# Patient Record
Sex: Female | Born: 1945 | Race: White | Hispanic: No | Smoking: Former smoker
Health system: Southern US, Community
[De-identification: ages and names within clinical notes are randomized; demographics above are authoritative.]

## PROBLEM LIST (undated history)

## (undated) DIAGNOSIS — R112 Nausea with vomiting, unspecified: Secondary | ICD-10-CM

## (undated) DIAGNOSIS — E039 Hypothyroidism, unspecified: Secondary | ICD-10-CM

## (undated) DIAGNOSIS — M1990 Unspecified osteoarthritis, unspecified site: Secondary | ICD-10-CM

## (undated) DIAGNOSIS — I480 Paroxysmal atrial fibrillation: Secondary | ICD-10-CM

## (undated) DIAGNOSIS — Z9889 Other specified postprocedural states: Secondary | ICD-10-CM

## (undated) DIAGNOSIS — I779 Disorder of arteries and arterioles, unspecified: Secondary | ICD-10-CM

## (undated) DIAGNOSIS — I82409 Acute embolism and thrombosis of unspecified deep veins of unspecified lower extremity: Secondary | ICD-10-CM

## (undated) DIAGNOSIS — Z87442 Personal history of urinary calculi: Secondary | ICD-10-CM

## (undated) DIAGNOSIS — E119 Type 2 diabetes mellitus without complications: Secondary | ICD-10-CM

## (undated) DIAGNOSIS — I6529 Occlusion and stenosis of unspecified carotid artery: Secondary | ICD-10-CM

## (undated) DIAGNOSIS — E785 Hyperlipidemia, unspecified: Secondary | ICD-10-CM

## (undated) DIAGNOSIS — G629 Polyneuropathy, unspecified: Secondary | ICD-10-CM

## (undated) DIAGNOSIS — M545 Low back pain, unspecified: Secondary | ICD-10-CM

## (undated) DIAGNOSIS — I10 Essential (primary) hypertension: Secondary | ICD-10-CM

## (undated) DIAGNOSIS — G8929 Other chronic pain: Secondary | ICD-10-CM

## (undated) DIAGNOSIS — A4902 Methicillin resistant Staphylococcus aureus infection, unspecified site: Secondary | ICD-10-CM

## (undated) DIAGNOSIS — R079 Chest pain, unspecified: Secondary | ICD-10-CM

## (undated) DIAGNOSIS — I4891 Unspecified atrial fibrillation: Secondary | ICD-10-CM

## (undated) DIAGNOSIS — K591 Functional diarrhea: Secondary | ICD-10-CM

## (undated) DIAGNOSIS — F32A Depression, unspecified: Secondary | ICD-10-CM

## (undated) DIAGNOSIS — K219 Gastro-esophageal reflux disease without esophagitis: Secondary | ICD-10-CM

## (undated) DIAGNOSIS — F329 Major depressive disorder, single episode, unspecified: Secondary | ICD-10-CM

## (undated) HISTORY — DX: Other specified postprocedural states: Z98.890

## (undated) HISTORY — DX: Paroxysmal atrial fibrillation: I48.0

## (undated) HISTORY — DX: Gastro-esophageal reflux disease without esophagitis: K21.9

## (undated) HISTORY — DX: Essential (primary) hypertension: I10

## (undated) HISTORY — DX: Acute embolism and thrombosis of unspecified deep veins of unspecified lower extremity: I82.409

## (undated) HISTORY — DX: Disorder of arteries and arterioles, unspecified: I77.9

## (undated) HISTORY — DX: Other chronic pain: G89.29

## (undated) HISTORY — PX: VAGINAL HYSTERECTOMY: SUR661

## (undated) HISTORY — DX: Hyperlipidemia, unspecified: E78.5

## (undated) HISTORY — DX: Chest pain, unspecified: R07.9

## (undated) HISTORY — PX: LAPAROSCOPIC CHOLECYSTECTOMY: SUR755

## (undated) HISTORY — DX: Low back pain, unspecified: M54.50

## (undated) HISTORY — DX: Type 2 diabetes mellitus without complications: E11.9

## (undated) HISTORY — DX: Occlusion and stenosis of unspecified carotid artery: I65.29

## (undated) HISTORY — DX: Unspecified osteoarthritis, unspecified site: M19.90

## (undated) HISTORY — DX: Low back pain: M54.5

## (undated) HISTORY — DX: Methicillin resistant Staphylococcus aureus infection, unspecified site: A49.02

---
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
## 1998-10-11 ENCOUNTER — Ambulatory Visit (HOSPITAL_COMMUNITY): Admission: RE | Admit: 1998-10-11 | Discharge: 1998-10-11 | Payer: Self-pay | Admitting: Neurosurgery

## 1998-12-27 HISTORY — PX: LAMINECTOMY: SHX219

## 1999-01-12 ENCOUNTER — Encounter: Payer: Self-pay | Admitting: Neurosurgery

## 1999-01-14 ENCOUNTER — Inpatient Hospital Stay (HOSPITAL_COMMUNITY): Admission: RE | Admit: 1999-01-14 | Discharge: 1999-01-18 | Payer: Self-pay | Admitting: Neurosurgery

## 1999-01-14 ENCOUNTER — Encounter: Payer: Self-pay | Admitting: Neurosurgery

## 1999-07-22 ENCOUNTER — Ambulatory Visit (HOSPITAL_COMMUNITY): Admission: RE | Admit: 1999-07-22 | Discharge: 1999-07-22 | Payer: Self-pay | Admitting: Neurosurgery

## 1999-10-06 ENCOUNTER — Ambulatory Visit (HOSPITAL_COMMUNITY): Admission: RE | Admit: 1999-10-06 | Discharge: 1999-10-06 | Payer: Self-pay | Admitting: Neurosurgery

## 1999-10-06 ENCOUNTER — Encounter: Payer: Self-pay | Admitting: Neurosurgery

## 2000-03-19 ENCOUNTER — Ambulatory Visit (HOSPITAL_COMMUNITY): Admission: RE | Admit: 2000-03-19 | Discharge: 2000-03-19 | Payer: Self-pay | Admitting: Neurosurgery

## 2000-03-19 ENCOUNTER — Encounter: Payer: Self-pay | Admitting: Neurosurgery

## 2000-11-21 ENCOUNTER — Encounter: Admission: RE | Admit: 2000-11-21 | Discharge: 2000-11-21 | Payer: Self-pay | Admitting: Neurosurgery

## 2000-11-21 ENCOUNTER — Encounter: Payer: Self-pay | Admitting: Neurosurgery

## 2001-04-07 ENCOUNTER — Encounter: Payer: Self-pay | Admitting: Neurosurgery

## 2001-04-07 ENCOUNTER — Ambulatory Visit (HOSPITAL_COMMUNITY): Admission: RE | Admit: 2001-04-07 | Discharge: 2001-04-07 | Payer: Self-pay | Admitting: Neurosurgery

## 2001-05-02 ENCOUNTER — Encounter: Admission: RE | Admit: 2001-05-02 | Discharge: 2001-05-02 | Payer: Self-pay | Admitting: Neurosurgery

## 2001-05-02 ENCOUNTER — Encounter: Payer: Self-pay | Admitting: Neurosurgery

## 2001-05-17 ENCOUNTER — Encounter: Admission: RE | Admit: 2001-05-17 | Discharge: 2001-05-17 | Payer: Self-pay | Admitting: Neurosurgery

## 2001-05-17 ENCOUNTER — Encounter: Payer: Self-pay | Admitting: Neurosurgery

## 2001-05-31 ENCOUNTER — Encounter: Payer: Self-pay | Admitting: Neurosurgery

## 2001-05-31 ENCOUNTER — Encounter: Admission: RE | Admit: 2001-05-31 | Discharge: 2001-05-31 | Payer: Self-pay | Admitting: Neurosurgery

## 2001-08-23 ENCOUNTER — Ambulatory Visit (HOSPITAL_COMMUNITY): Admission: RE | Admit: 2001-08-23 | Discharge: 2001-08-23 | Payer: Self-pay | Admitting: Neurosurgery

## 2001-08-23 ENCOUNTER — Encounter: Payer: Self-pay | Admitting: Neurosurgery

## 2001-08-24 ENCOUNTER — Encounter: Payer: Self-pay | Admitting: Neurosurgery

## 2001-08-24 ENCOUNTER — Ambulatory Visit (HOSPITAL_COMMUNITY): Admission: RE | Admit: 2001-08-24 | Discharge: 2001-08-24 | Payer: Self-pay | Admitting: Neurosurgery

## 2001-10-20 ENCOUNTER — Encounter: Payer: Self-pay | Admitting: *Deleted

## 2001-10-24 ENCOUNTER — Inpatient Hospital Stay (HOSPITAL_COMMUNITY): Admission: RE | Admit: 2001-10-24 | Discharge: 2001-10-26 | Payer: Self-pay | Admitting: *Deleted

## 2001-10-24 ENCOUNTER — Encounter (INDEPENDENT_AMBULATORY_CARE_PROVIDER_SITE_OTHER): Payer: Self-pay | Admitting: *Deleted

## 2001-10-25 HISTORY — PX: CAROTID ENDARTERECTOMY: SUR193

## 2002-01-30 ENCOUNTER — Encounter: Payer: Self-pay | Admitting: Neurosurgery

## 2002-01-30 ENCOUNTER — Encounter: Admission: RE | Admit: 2002-01-30 | Discharge: 2002-01-30 | Payer: Self-pay | Admitting: Neurosurgery

## 2002-02-15 ENCOUNTER — Encounter: Payer: Self-pay | Admitting: Radiology

## 2002-02-15 ENCOUNTER — Encounter: Payer: Self-pay | Admitting: Neurosurgery

## 2002-02-15 ENCOUNTER — Encounter: Admission: RE | Admit: 2002-02-15 | Discharge: 2002-02-15 | Payer: Self-pay | Admitting: Neurosurgery

## 2002-03-08 ENCOUNTER — Encounter: Payer: Self-pay | Admitting: Neurosurgery

## 2002-03-08 ENCOUNTER — Encounter: Admission: RE | Admit: 2002-03-08 | Discharge: 2002-03-08 | Payer: Self-pay | Admitting: Neurosurgery

## 2002-09-11 ENCOUNTER — Encounter: Payer: Self-pay | Admitting: Neurosurgery

## 2002-09-11 ENCOUNTER — Ambulatory Visit (HOSPITAL_COMMUNITY): Admission: RE | Admit: 2002-09-11 | Discharge: 2002-09-11 | Payer: Self-pay | Admitting: Neurosurgery

## 2004-12-27 DIAGNOSIS — R079 Chest pain, unspecified: Secondary | ICD-10-CM

## 2004-12-27 HISTORY — DX: Chest pain, unspecified: R07.9

## 2005-02-24 ENCOUNTER — Ambulatory Visit (HOSPITAL_COMMUNITY): Admission: RE | Admit: 2005-02-24 | Discharge: 2005-02-24 | Payer: Self-pay | Admitting: Neurosurgery

## 2005-04-13 ENCOUNTER — Ambulatory Visit: Payer: Self-pay | Admitting: Cardiology

## 2005-04-19 ENCOUNTER — Ambulatory Visit: Payer: Self-pay | Admitting: *Deleted

## 2005-04-21 ENCOUNTER — Ambulatory Visit: Payer: Self-pay | Admitting: Cardiology

## 2005-04-22 ENCOUNTER — Inpatient Hospital Stay (HOSPITAL_BASED_OUTPATIENT_CLINIC_OR_DEPARTMENT_OTHER): Admission: RE | Admit: 2005-04-22 | Discharge: 2005-04-22 | Payer: Self-pay | Admitting: *Deleted

## 2005-04-22 ENCOUNTER — Ambulatory Visit: Payer: Self-pay | Admitting: Cardiology

## 2005-05-04 ENCOUNTER — Ambulatory Visit: Payer: Self-pay | Admitting: Cardiology

## 2006-04-27 ENCOUNTER — Encounter: Payer: Self-pay | Admitting: Neurosurgery

## 2006-07-29 ENCOUNTER — Ambulatory Visit (HOSPITAL_COMMUNITY): Admission: RE | Admit: 2006-07-29 | Discharge: 2006-07-29 | Payer: Self-pay | Admitting: Neurosurgery

## 2008-06-13 ENCOUNTER — Ambulatory Visit: Payer: Self-pay | Admitting: *Deleted

## 2009-03-06 ENCOUNTER — Ambulatory Visit: Payer: Self-pay | Admitting: *Deleted

## 2009-03-23 ENCOUNTER — Encounter: Admission: RE | Admit: 2009-03-23 | Discharge: 2009-03-23 | Payer: Self-pay | Admitting: Neurosurgery

## 2009-06-12 ENCOUNTER — Ambulatory Visit: Payer: Self-pay | Admitting: *Deleted

## 2009-11-02 ENCOUNTER — Encounter: Admission: RE | Admit: 2009-11-02 | Discharge: 2009-11-02 | Payer: Self-pay | Admitting: Neurosurgery

## 2010-05-05 ENCOUNTER — Encounter: Admission: RE | Admit: 2010-05-05 | Discharge: 2010-05-05 | Payer: Self-pay | Admitting: Neurosurgery

## 2010-07-10 ENCOUNTER — Ambulatory Visit: Payer: Self-pay | Admitting: Vascular Surgery

## 2011-04-27 ENCOUNTER — Other Ambulatory Visit: Payer: Self-pay | Admitting: Neurosurgery

## 2011-04-27 DIAGNOSIS — M4712 Other spondylosis with myelopathy, cervical region: Secondary | ICD-10-CM

## 2011-05-05 ENCOUNTER — Ambulatory Visit
Admission: RE | Admit: 2011-05-05 | Discharge: 2011-05-05 | Disposition: A | Discharge status: Home / Self Care | Payer: Medicare Other | Source: Ambulatory Visit | Attending: Neurosurgery | Admitting: Neurosurgery

## 2011-05-05 DIAGNOSIS — M4712 Other spondylosis with myelopathy, cervical region: Secondary | ICD-10-CM

## 2011-05-11 NOTE — Procedures (Signed)
CAROTID DUPLEX EXAM   INDICATION:  Follow up carotid artery disease.   HISTORY:  Diabetes:  No.  Cardiac:  No.  Hypertension:  Yes.  Smoking:  Previous.  Previous Surgery:  Right carotid endarterectomy, 10/25/2001.  CV History:  No.  Amaurosis Fugax No, Paresthesias No, Hemiparesis No.                                       RIGHT             LEFT  Brachial systolic pressure:         140               142  Brachial Doppler waveforms:         WNL               WNL  Vertebral direction of flow:        Antegrade         Antegrade  DUPLEX VELOCITIES (cm/sec)  CCA peak systolic                   80                92  ECA peak systolic                   82                95  ICA peak systolic                   82                134  ICA end diastolic                   34                51  PLAQUE MORPHOLOGY:                                    Heterogenous  PLAQUE AMOUNT:                                        Mild-to-moderate  PLAQUE LOCATION:                                      ICA   IMPRESSION:  1. Right internal carotid artery suggests no restenosis, status post      carotid endarterectomy.  2. Left internal carotid artery suggests 40% to 59% stenosis.  3. Antegrade flow in bilateral vertebrals.        ___________________________________________  Larina Earthly, M.D.   CB/MEDQ  D:  07/10/2010  T:  07/10/2010  Job:  [id number]

## 2011-05-11 NOTE — Procedures (Signed)
DUPLEX DEEP VENOUS EXAM - LOWER EXTREMITY   INDICATION:  Left lower extremity swelling.   HISTORY:  Edema:  Left lower extremity swelling for 1 week.  Trauma/Surgery:  No.  Pain:  No.  PE:  No.  Previous DVT:  No.  Anticoagulants:  Other:   DUPLEX EXAM:                CFV   SFV   PopV  PTV    GSV                R  L  R  L  R  L  R   L  R  L  Thrombosis    o  o     +     +      o     o  Spontaneous   +  +     +     +      +     +  Phasic        +  +     +     +      +     +  Augmentation  +  +     d     d      +     +  Compressible  +  +     p     p      +     +  Competent     +  +     +     +      +     +   Legend:  + - yes  o - no  p - partial  D - decreased   IMPRESSION:  Subacute, partially occlusive deep venous thrombosis noted  throughout the left mid to distal thigh superficial femoral vein,  popliteal, and proximal calf peroneal veins.    _____________________________  Larina Earthly, M.D.   CH/MEDQ  D:  07/13/2010  T:  07/13/2010  Job:  [id number]

## 2011-05-11 NOTE — Assessment & Plan Note (Signed)
OFFICE VISIT   Martha Torres, Martha Torres  DOB:  Mar 15, 1946                                       03/06/2009  PPIRJ#:[id number]   The patient is a 65 year old female with a history of right carotid  endarterectomy carried out approximately 10 years ago.  She has had  multiple lumbar spine procedures carried out by Dr. Channing Mutters.  She notes  lower extremity pain, a feeling of cramping and discomfort in her legs  radiating from the calves up into the thighs bilaterally.  This occurs  primarily at night.  This is not associated with ambulation although she  does feel some soreness in her legs if she has had significant cramps  the night before.   PAST MEDICAL HISTORY:  Is remarkable for hypertension and  hyperlipidemia.  She has a history of right carotid endarterectomy.   CURRENT MEDICATIONS:  1. Include number 103 mg q.8 h.  2. Lisinopril 20 mg b.i.d.  3. Amlodipine 10 mg daily.  4. Atenolol/hydrochlorothiazide 50/25 one tablet daily.  5. K-Dur 20 b.i.d.  6. Diclofenac sodium 75 mg b.i.d.  7. Lipitor 10 mg daily.  8. Prevacid 10 mg daily.  9. Aspirin 325 mg daily.  10.Flexeril 10 mg q.h.s.  11.Metoclopramide q.i.d.   ALLERGIES:  None known.   PHYSICAL EXAMINATION:  General:  The patient is a moderately obese 57-  year-old female.  Alert and oriented.  No distress.  Vital signs:  Blood  pressure is 103/68, pulse 66 per minute.  Neck:  Reveals no bruits.  No  thyromegaly or adenopathy.  Chest:  Is clear with equal entry  bilaterally.  Normal heart sounds without murmurs.  Abdomen:  Reveals  obesity.  Nontender.  No masses or organomegaly.  Normal bowel sounds  without bruits.  Lower extremities:  Reveal intact femoral, popliteal,  posterior tibial and dorsalis pedis pulses bilaterally.  No ankle edema.  She does have bilateral varicose veins.  No ulceration.   INVESTIGATIONS:  Lower extremity Doppler reveals ankle brachial indices  greater than 1.0  bilaterally with triphasic arterial waveforms, normal  study.   IMPRESSION:  Bilateral leg pain, primarily cramps occurring at night.  No symptoms consistent with claudication.  Normal lower extremity  Dopplers.   Suggest possible change of statin agent to see if this improves her  symptoms.   Balinda Quails, M.D.  Electronically Signed   PGH/MEDQ  D:  03/06/2009  T:  03/07/2009  Job:  [id number]   cc:   Payton Doughty, M.D.  Erasmo Downer, MD

## 2011-05-11 NOTE — Assessment & Plan Note (Signed)
OFFICE VISIT   Martha Torres, Martha Torres  DOB:  07-21-1946                                       07/10/2010  ZOXWR#:[id number]   CHIEF COMPLAINT:  1. Follow-up right carotid endarterectomy.  2. Left leg swelling x1 week.   HISTORY OF PRESENT ILLNESS:  Patient is a 65 year old woman with a right  carotid endarterectomy in October 2002 by Dr. Madilyn Fireman.  The patient has  done well and has had no symptoms of stroke, TIA or amaurosis since that  time.  Her Doppler studies in the office today were unchanged with end-  to-side diastolic velocities of 34 on the right and 51 on the left.  She  has antegrade flow in the bilateral vertebrals.   The patient also states she has had some swelling in the left lower  extremity for 1 week.  She has had no calf pain.  She does have some  history of varicosities but has never had swelling in the leg before.  She has not been on any long trips and has no symptoms of claudication.   On physical exam, this is a well-developed, well-nourished woman in no  acute distress.  Her heart rate of 61, blood pressure is 122/74.  Her  respiratory rate was 10.  Neurologically, she is alert and oriented x3.  She had good and equal strength in bilateral upper and lower  extremities.  The right neck is well healed.  Toes on both feet were  cool, but the feet were well-perfused.  She had palpable posterior  tibial pulse bilaterally and DP's per Doppler.  The left leg was  swollen, edematous, and red.  She had a negative Homans' sign.   VASCULAR LABS:  The carotid studies were unchanged with the right ICA  suggestive of 20% to 39% stenosis and a left 40% to 59% stenosis.   Ultrasound of the left lower extremity for DVT showed a subacute partial  occlusive DVT throughout the left mid distal thigh SFA, popliteal, and  proximal calf peroneal veins.   ASSESSMENT:  1. Stable carotid stenosis.  2. Positive deep venous thrombosis in the left lower  extremity.   PLAN:  Place the patient on Coumadin 5 mg daily and Lovenox 100 mg  b.i.d. subcu.  She will be followed by Dr. Linna Darner in [place] and have her  INR drawn on Tuesday.  She will see him on Wednesday, and she will  follow up with [place] for her carotid studies per protocol.   Della Goo, PA-C   [place]. Edilia Bo, M.D.  Electronically Signed   RR/MEDQ  D:  07/10/2010  T:  07/10/2010  Job:  [id number]

## 2011-05-11 NOTE — Procedures (Signed)
CAROTID DUPLEX EXAM   INDICATION:  Followup, carotid artery disease.   HISTORY:  Diabetes:  No.  Cardiac:  No.  Hypertension:  Yes.  Smoking:  Quit.  Previous Surgery:  Right CEA with DPA, 10/25/01 by Dr. Madilyn Fireman.  CV History:  No.  Amaurosis Fugax No, Paresthesias No, Hemiparesis No                                       RIGHT             LEFT  Brachial systolic pressure:         148               150  Brachial Doppler waveforms:         Triphasic         Triphasic  Vertebral direction of flow:        Antegrade         Antegrade  DUPLEX VELOCITIES (cm/sec)  CCA peak systolic                   79                72  ECA peak systolic                   85                76  ICA peak systolic                   69                161  ICA end diastolic                   29                54  PLAQUE MORPHOLOGY:                  Mixed             Calcified  PLAQUE AMOUNT:                      Mild              Moderate  PLAQUE LOCATION:                    ICA               ICA   IMPRESSION:  1. Right internal carotid artery shows evidence of 1-39%, status post      carotid endarterectomy.  2. Left internal carotid artery shows evidence of 40-59% stenosis,      showing no significant velocity change; however, now is in a lower      category than previous due to criteria change.   ___________________________________________  P. Liliane Bade, M.D.   AS/MEDQ  D:  06/13/2008  T:  06/13/2008  Job:  [id number]

## 2011-05-11 NOTE — Procedures (Signed)
CAROTID DUPLEX EXAM   INDICATION:  Follow-up evaluation of known carotid artery disease.   HISTORY:  Diabetes:  No.  Cardiac:  No.  Hypertension:  Yes.  Smoking:  Former smoker.  Previous Surgery:  Right carotid endarterectomy with Dacron patch  angioplasty on October 25, 2001, by Dr. Madilyn Fireman.  CV History:  Previous duplex performed June 13, 2008, revealed a 1% to  39% right ICA stenosis status post endarterectomy and 40% to 59% left  ICA stenosis.  Amaurosis Fugax No, Paresthesias No, Hemiparesis No.                                       RIGHT             LEFT  Brachial systolic pressure:         128               140  Brachial Doppler waveforms:         Triphasic         Triphasic  Vertebral direction of flow:        Antegrade         Antegrade  DUPLEX VELOCITIES (cm/sec)  CCA peak systolic                   182               76  ECA peak systolic                   125               98  ICA peak systolic                   77                137  ICA end diastolic                   29                49  PLAQUE MORPHOLOGY:                  Soft              Calcified  PLAQUE AMOUNT:                      Mild              Mild to moderate  PLAQUE LOCATION:                    Proximal ICA      Proximal ICA   IMPRESSION:  1. A 20% to 39% right ICA stenosis.  2. A 40% to 59% left ICA stenosis.  3. No significant change from previous study performed June 13, 2008.   ___________________________________________  P. Liliane Bade, M.D.   MC/MEDQ  D:  06/12/2009  T:  06/12/2009  Job:  [id number]

## 2011-05-14 NOTE — Cardiovascular Report (Signed)
NAMEBINDU, DOCTER NO.:  [id number]   MEDICAL RECORD NO.:  [id number]          PATIENT TYPE:  OIB   LOCATION:  6501                         FACILITY:  [facility]   PHYSICIAN:  Salvadore Farber, M.D. LHCDATE OF BIRTH:  05/20/1946   DATE OF PROCEDURE:  04/22/2005  DATE OF DISCHARGE:                              CARDIAC CATHETERIZATION   PROCEDURES:  1.  Left heart catheterization.  2.  Left ventriculography.  3.  Coronary angiography.   INDICATIONS:  Ms. Breece is a 65 year old lady with tobacco abuse, who  presents with chest pain occurring both with exertion and at rest.  She  underwent an adenosine Cardiolite performed on April 13, 2005.  This  demonstrated preserved left ventricular systolic function, but demonstrated  moderate sized anteroapical and small inferobasilar reversible defects.  She  was therefore referred for diagnostic angiography.   PROCEDURAL TECHNIQUE:  Informed consent was obtained.  Under 1% lidocaine  local anesthesia, a 4 French sheath was placed in the right common femoral  artery using the modified Seldinger technique.  Diagnostic angiography and  ventriculography were performed using JL4, JR4 and pigtail catheters.  The  patient tolerated the procedure well and was transferred to the holding room  in stable condition.  Sheath will be removed there.   COMPLICATIONS:  None.   FINDINGS:  1.  LV:  115/10/18.  EF 70% without regional wall motion abnormality.  2.  No aortic stenosis or mitral regurgitation.  3.  Left main:  Angiographically normal.  4.  LAD:  Moderate size vessel giving rise to a single diagonal.  It is      angiographically normal.  5.  Ramus intermedius:  A fairly large vessel which was angiographically      normal.  6.  Circumflex:  Diminutive vessel giving rise to no significant branches.      It is angiographically normal.  7.  RCA:  A fairly large dominant vessel.  It is angiographically normal.   IMPRESSION/RECOMMENDATIONS:  Angiographically normal coronary arteries.  Normal left ventricular size and systolic function.  I suspect a noncardiac  etiology to her chest discomfort.  She has been asked to follow up with her  primary care doctor, Dr. Linna Darner, for further evaluation of her chest pain  syndrome.      WED/MEDQ  D:  04/22/2005  T:  04/22/2005  Job:  [id number]   cc:   Regina Eck, M.D.  [place], [place]

## 2011-05-14 NOTE — H&P (Signed)
. [facility]  Patient:    Martha Torres, Martha Torres Visit Number: [id number] MRN: [id number]          Service Type: Attending:  Denman George, M.D. Dictated by:   Durenda Age, P.A.-C. Adm. Date:  10/24/01   CC:         M. Linna Darner, M.D.  Payton Doughty, M.D.   History and Physical  DATE OF BIRTH:  May 09, 1946.  CHIEF COMPLAINT:  Right carotid artery disease.  HISTORY OF PRESENT ILLNESS:  The patient is a pleasant 65 year old white female referred by Dr. Channing Mutters for evaluation of carotid artery disease.  Over the last few months the patient began experiencing the current episodes of dizziness and headaches.  An MRI of the brain revealed decreased flow in the right ICA, for which Dopplers were performed to follow up on the condition. These Dopplers revealed severe right ICA stenosis with peak systolic velocity of 383 cm per second, with an end diastolic of 164.  Based on these findings, and the patient being symptomatic, Dr. Madilyn Fireman recommended to proceed with right CEA in order reduce the risk of a stroke.  The patient complains of headaches, as described above, occasional dizziness and 1 month ago a history of falls. She was taking care of her dogs, and the next thing she remembers is being on the ground.  No seizure activity.  She has chronic numbness in the right heel and dorsum of her foot, and some tingling in the left and right lower extremity related to her spinal, vertebral condition (per patient report).  She also complains of her right leg giving out after walking a few blocks, no speech impairment, dysphagia, vision changes, syncope, presyncope, memory loss or confusion.  PAST MEDICAL HISTORY: 1. Carotid artery disease as above. 2. Questionable history of diabetes mellitus, diet controlled. 3. Hypertension.  4. Osteoarthritis. 5. GERD symptoms - esophageal stricture. 6. History of tobacco abuse, ongoing.  PAST SURGICAL HISTORY:  Status post  back surgery x 4 in 1996, 1997, 1998 and 2000.  MEDICATIONS: 1. Neurontin 300 mg t.i.d. 2. Amitriptyline 50 mg 2 p.o. q.h.s. 3. Celebrex 200 mg b.i.d. 4. Atenolol - Chlor 50-25 q.d. 5. Prevacid 15 mg q.d. 6. Prempro 1.25 mg q.d. 7. Metoclopramide 10 mg 1 p.o. q.i.d. 8. Lipitor 10 mg q.d. 9. Lasix 40 mg 1 p.o. 2 times a week.  ALLERGIES:  No known drug allergies.  REVIEW OF SYSTEMS:  See HPI and past medical history for significant positive. No kidney disease.  No asthma.  She does complain of increased tiredness with decrease in energy.  FAMILY HISTORY:  Mother died at 11 of MI, she had a history of CVA.  Father died of cancer, he also had a history of heart disease, he died at 69.  SOCIAL HISTORY:  Widowed, 3 children.  She is retired.  She is accompanied by a neighbor.  She smokes 1-1/2 pack of tobacco a week, for the last 35 years. She only drinks alcohol on social occasions.  PHYSICAL EXAMINATION:  GENERAL:  Well-developed well-nourished 65 year old, moderately obese white female in no acute distress, alert and oriented x 3.  VITAL SIGNS:  Blood pressure 170/96, pulse 76, respirations 16.  HEENT:  Normocephalic and atraumatic.  PERRLA, EOMI.  Right mild cataracts, no glaucoma or macular degeneration.  NECK:  Supple, no JVD.  Right soft bruits.  No lymphadenopathy.  CHEST:  Symmetrical on inspiration.  LUNGS:  Showed no wheezes, rhonchis, rubs or lymphadenopathy.  CARDIOVASCULAR:  Regular rate and rhythm 1/6 systolic murmur.  No rubs or gallops.  ABDOMEN:  Soft, nontender, bowel sounds x 4.  No masses or bruits.  NOTE:  The exam is difficult secondary to obesity.  GU and rectal deferred.  EXTREMITIES:  No clubbing, cyanosis, or edema, no ulcerations.  Temperature warm.  Peripheral pulses, carotids, femoral, popliteal, dorsalis pedis, and posterior tibialis 2+.  NEUROLOGIC:  Nonfocal.  Gait steady.  DTRs 2+.  Muscle strength 5/5.  ASSESSMENT AND PLAN:  Right  ICA stenosis for right CEA on October 24, 2001, by Dr. Madilyn Fireman.  Dr. Madilyn Fireman has seen and evaluated this patient prior to this admission and has explained the risks and benefits involving the procedure and the patient agreed to continue. Dictated by:   Durenda Age, P.A.-C. Attending:  Denman George, M.D. DD:  10/20/01 TD:  10/20/01 Job: [id number]

## 2011-05-14 NOTE — Discharge Summary (Signed)
[place]  Patient:    Martha Torres, Martha Torres Visit Number: [id number] MRN: [id number]          Service Type: SUR Location: [id number] Attending Physician:  Melvenia Needles Dictated by:   Lissa Merlin, P.A. Admit Date:  10/24/2001 Discharge Date: 10/26/2001   CC:         M. Linna Darner, M.D.   Discharge Summary  DATE OF BIRTH:  03-23-1946  ADMISSION DIAGNOSES: 1. Symptomatic right internal carotid artery stenosis. 2. Hypokalemia.  DISCHARGE DIAGNOSES: 1. Symptomatic right internal carotid artery stenosis. 2. Hypokalemia.  PAST MEDICAL HISTORY: 1. Recently diagnosed carotid artery disease. 2. Hypertension. 3. Diet controlled diabetes mellitus. 4. Gastroesophageal reflux disease with esophageal stricture. 5. History of smoking. 6. Osteoarthritis. 7. Previous back surgeries in 1997, 1998 and year 2000, by Dr. Trey Sailors.  PROCEDURE:  Right carotid endarterectomy with Dacron patch angioplasty on October 25, 2001.  HISTORY OF PRESENT ILLNESS:  The patient is a pleasant 65 year old, white female who was referred by Dr. Channing Mutters for evaluation of carotid artery disease. She had seen Dr. Channing Mutters in the past for several back surgeries.  Over the last few months, the patient began experiencing headaches and dizziness.  MRI of the brain revealed decreased flow in the right ICA for which Dopplers were performed to follow up on the condition.  These Dopplers revealed severe right internal carotid artery stenosis with peak systolic velocity at 383 cm per second with an end-diastolic of 164.  She was referred to Dr. Madilyn Fireman who she was familiar with because he had previously cared for her now deceased husband.  He recommended proceeding with right carotid endarterectomy in order to reduce the risk of stroke.  Risk, benefits, details and alternatives of surgery were discussed and agreed to proceed.  HOSPITAL COURSE:  [name] came into the hospital for the  elected procedure on October 24, 2001.  Initially, upon admission, her potassium was found to be low at 2.8.  Also, the OR schedule was very tight.  It was therefore decided to replace he potassium overnight and reschedule for in the morning.  She remained stable overnight.  The next morning, she underwent right carotid endarterectomy with Dacron patch angioplasty with no complications.  She was taken to the PACU in stable condition.  Postop, she did well with routine care.  The next morning, October 26, 2001, she was up and about feeling well. She was even working a Electronics engineer.  She had walked and had something to eat. Vital signs were stable.  She was in sinus rhythm.  She was afebrile.  She had good urinary output.  Potassium had risen to 3.0.  Physical exam was satisfactory.  Neurologically, she was completely intact with equal grip strength, no face droops, normal speech, no visual abnormalities and no deficits throughout.  Her neck wound looked fine.  There was no edema or bleeding.  She received additional potassium and was subsequently discharged home with a few days of potassium.  DISCHARGE MEDICATIONS: 1. Celebrex 200 mg one p.o. b.i.d. 2. Atenolol/chlorthalidone 50/25 one p.o. q.d. 3. Aspirin 325 mg. 4. Prevacid 15 mg one p.o. q..d 5. Prempro 1.25 mg q.d. 6. Lipitor 10 mg q.d. 7. Amitriptyline 50 mg two p.o. q.h.s. 8. Neurontin 300 mg t.i.d. 9. Percocet 5/325 one to two p.o. q.4-6h. p.r.n. pain.  ALLERGIES:  No known drug allergies.  CONDITION ON DISCHARGE:  Stable.  ACTIVITY:  Avoid driving for two weeks.  No heavy lifting or strenuous activity.  WOUND CARE:  She could shower using soap and water on her wound cleaning it gently daily.  She was told to be alert for increasing redness, swelling, drainage or fever.  FOLLOWUP:  Follow up with Dr. Madilyn Fireman on Monday, November 20, 2001, at 12:30 p.m. Dictated by:   Lissa Merlin, P.A. Attending Physician:  Melvenia Needles DD:  10/26/01 TD:  10/27/01 Job: [id number]

## 2011-05-14 NOTE — Op Note (Signed)
[place]  Patient:    Martha Torres, Martha Torres Visit Number: [id number] MRN: [id number]          Service Type: SUR Location: [id number] Attending Physician:  Melvenia Needles Dictated by:   Denman George, M.D. Proc. Date: 10/25/01 Admit Date:  10/24/2001 Discharge Date: 10/26/2001   CC:         Payton Doughty, M.D.  M. Linna Darner, M.D., [facility]   Operative Report  PREOPERATIVE DIAGNOSIS:  Severe right internal carotid artery stenosis.  POSTOPERATIVE DIAGNOSIS:  Severe right internal carotid artery stenosis.  PROCEDURE:  Right carotid endarterectomy, Dacron patch angioplasty.  SURGEON:  Denman George, M.D.  ASSISTANT:  Lissa Merlin, P.A.  ANESTHESIA:  General endotracheal.  ANESTHESIOLOGIST:  Maren Beach, M.D.  CLINICAL NOTE:  This is a 65 year old female referred for evaluation of severe right internal carotid artery stenosis by Doppler.  The patient seen and evaluated and findings confirmed.  It was recommended she undergo right carotid endarterectomy for reduction of stroke risk.  Patient consented for surgery.  The risks and benefits of the operative procedure explained to the patient in detail, including the potential risks of MI, CVA, and death of approximately 1-2%.  DESCRIPTION OF PROCEDURE:  The patient was brought to the operating room in stable condition.  Arterial line placed preoperatively.  General endotracheal anesthesia induced.  Foley catheter inserted.  Right neck was prepped and draped in a sterile fashion.  Curvilinear skin incision made along the anterior border of the right sternomastoid muscle.  Subcutaneous tissue and platysma divided with electrocautery.  Deep dissection carried along the anterior border of the sternomastoid to the carotid sheath.  The facial vein ligated with 2-0 silk tie and divided.  The carotid bifurcation exposed.  The common carotid artery mobilized down to the omohyoid muscle.  The  vagus nerve reflected posteriorly and preserved.  The common carotid artery encircled with a vessel loop. Distal dissection then carried up to the carotid bifurcation, where the superior thyroid and external carotid origin were encircled with fine vessel loops.  The internal carotid artery followed distally up to the posterior belly of the digastric muscle and encircled with a vessel loop.  The carotid bifurcation revealed fibrotic plaque extending into the origin of the right internal carotid artery.  The patient administered 7000 units of heparin intravenously.  Adequate circulation time permitted.  The carotid vessels controlled with clamps.  A longitudinal arteriotomy made in the distal common carotid artery.  The arteriotomy extended across the carotid bulb and up to the internal carotid artery.  There was a very tight stenosis at the origin of the right internal carotid artery, greater than 80%.  A shunt was initially inserted.  An endarterectomy elevator used to remove the plaque.  The endarterectomy carried down into the common carotid artery.  The plaque was divided transversely with Potts scissors.  The plaque then raised up into the bulb, where the superior thyroid and external carotid artery were endarterectomized with eversion.  The plaque then raised up into the internal carotid artery, where it attenuated and was feathered out.  Fragments of plaque removed with plaque forceps.  The site irrigated with heparin-saline solution.  The shunt was then removed, and all vessels were well-flushed.  A Finesse patch was placed over the endarterectomy site with running 6-0 Prolene suture. The vessels were all flushed again.  Clamps were then removed, directing the initial antegrade flow up the external carotid artery.  Following this, the internal carotid was released.  Excellent pulse and Doppler signal in the distal internal carotid artery.  The patient administered 50 mg of  protamine intravenously.  The sternomastoid fascia was then closed with running 2-0 Vicryl suture.  The platysma closed with running 3-0 Vicryl suture.  Skin closed with 4-0 Monocryl.  Half-inch Steri-Strips applied.  The patient tolerated the procedure well.  Transferred to the recovery room in stable condition. Dictated by:   Denman George, M.D. Attending Physician:  Melvenia Needles DD:  10/25/01 TD:  10/26/01 Job: [id number]

## 2011-07-06 ENCOUNTER — Other Ambulatory Visit: Payer: Self-pay

## 2011-07-20 ENCOUNTER — Other Ambulatory Visit (INDEPENDENT_AMBULATORY_CARE_PROVIDER_SITE_OTHER): Payer: Medicare Other

## 2011-07-20 DIAGNOSIS — I6529 Occlusion and stenosis of unspecified carotid artery: Secondary | ICD-10-CM

## 2011-07-20 DIAGNOSIS — Z48812 Encounter for surgical aftercare following surgery on the circulatory system: Secondary | ICD-10-CM

## 2011-07-30 NOTE — Procedures (Unsigned)
CAROTID DUPLEX EXAM  INDICATION:  Right carotid endarterectomy.  HISTORY: Diabetes:  No. Cardiac:  No. Hypertension:  Yes. Smoking:  Previous. Previous Surgery:  Right carotid endarterectomy on 10/25/2001. CV History:  Currently asymptomatic. Amaurosis Fugax No, Paresthesias No, Hemiparesis No                                      RIGHT             LEFT Brachial systolic pressure:         144               140 Brachial Doppler waveforms:         Normal            Normal Vertebral direction of flow:        Antegrade         Antegrade DUPLEX VELOCITIES (cm/sec) CCA peak systolic                   76                71 ECA peak systolic                   75                76 ICA peak systolic                   47                154 ICA end diastolic                   14                49 PLAQUE MORPHOLOGY:                                    Heterogeneous PLAQUE AMOUNT:                      None              Moderate PLAQUE LOCATION:                                      ICA  IMPRESSION: 1. Patent right carotid endarterectomy site with no right internal     carotid artery stenosis. 2. Doppler velocity suggests a 40%-59% stenosis of the left internal     carotid artery. 3. No significant change noted when compared to the previous exam on     07/10/2010.  ___________________________________________ Di Kindle. Edilia Bo, M.D.  CH/MEDQ  D:  07/26/2011  T:  07/26/2011  Job:  [id number]

## 2012-08-02 ENCOUNTER — Other Ambulatory Visit: Payer: Medicare Other

## 2012-08-10 ENCOUNTER — Encounter: Payer: Self-pay | Admitting: Neurosurgery

## 2012-08-11 ENCOUNTER — Encounter: Payer: Self-pay | Admitting: Neurosurgery

## 2012-08-11 ENCOUNTER — Ambulatory Visit (INDEPENDENT_AMBULATORY_CARE_PROVIDER_SITE_OTHER): Payer: Medicare Other | Admitting: Neurosurgery

## 2012-08-11 ENCOUNTER — Ambulatory Visit (INDEPENDENT_AMBULATORY_CARE_PROVIDER_SITE_OTHER): Payer: Medicare Other | Admitting: *Deleted

## 2012-08-11 VITALS — BP 141/82 | HR 65 | Resp 16 | Ht 65.0 in | Wt 211.0 lb

## 2012-08-11 DIAGNOSIS — I6529 Occlusion and stenosis of unspecified carotid artery: Secondary | ICD-10-CM

## 2012-08-11 DIAGNOSIS — Z48812 Encounter for surgical aftercare following surgery on the circulatory system: Secondary | ICD-10-CM

## 2012-08-11 NOTE — Addendum Note (Signed)
Addended by: Sharee Pimple on: 08/11/2012 03:49 PM   Modules accepted: Orders

## 2012-08-11 NOTE — Progress Notes (Signed)
[facility] Car[facility]id Office Note  CC: Annual carotid surveillance Referring Physician: Edilia Bo  History of Present Illness: 66 year old female patient of Dr. Edilia Bo who status post right carotid endarterectomy in 2002. The patient denies any signs or symptoms of CVA, TIA, amaurosis fugax or any neural deficit. Patient denies any medical diagnoses or recent surgery.  No past medical history on file.  ROS: [x]  Positive   [ ]  Denies    General: [ ]  Weight loss, [ ]  Fever, [ ]  chills Neurologic: [ ]  Dizziness, [ ]  Blackouts, [ ]  Seizure [ ]  Stroke, [ ]  "Mini stroke", [ ]  Slurred speech, [ ]  Temporary blindness; [ ]  weakness in arms or legs, [ ]  Hoarseness Cardiac: [ ]  Chest pain/pressure, [ ]  Shortness of breath at rest [ ]  Shortness of breath with exertion, [ ]  Atrial fibrillation or irregular heartbeat Vascular: [ ]  Pain in legs with walking, [ ]  Pain in legs at rest, [ ]  Pain in legs at night,  [ ]  Non-healing ulcer, [ ]  Blood clot in vein/DVT,   Pulmonary: [ ]  Home oxygen, [ ]  Productive cough, [ ]  Coughing up blood, [ ]  Asthma,  [ ]  Wheezing Musculoskeletal:  [ ]  Arthritis, [ ]  Low back pain, [ ]  Joint pain Hematologic: [ ]  Easy Bruising, [ ]  Anemia; [ ]  Hepatitis Gastrointestinal: [ ]  Blood in stool, [ ]  Gastroesophageal Reflux/heartburn, [ ]  Trouble swallowing Urinary: [ ]  chronic Kidney disease, [ ]  on HD - [ ]  MWF or [ ]  TTHS, [ ]  Burning with urination, [ ]  Difficulty urinating Skin: [ ]  Rashes, [ ]  Wounds Psychological: [ ]  Anxiety, [ ]  Depression   Social History History  Substance Use Topics  . Smoking status: Never Smoker   . Smokeless tobacco: Not on file  . Alcohol Use: No    Family History No family history on file.  Not on File  Current Outpatient Prescriptions  Medication Sig Dispense Refill  . aspirin 81 MG tablet Take 81 mg by mouth daily.      Marland Kitchen atenolol-chlorthalidone (TENORETIC) 50-25 MG per tablet daily.      .  cyclobenzaprine (FLEXERIL) 10 MG tablet Take 10 mg by mouth every 8 (eight) hours.      . diclofenac (VOLTAREN) 75 MG EC tablet Twice daily.      Marland Kitchen gabapentin (NEURONTIN) 300 MG capsule every 8 (eight) hours.      Marland Kitchen lisinopril (PRINIVIL,ZESTRIL) 20 MG tablet Twice daily.      . metoCLOPramide (REGLAN) 10 MG tablet 4 times daily.      . Omega-3 Fatty Acids (FISH OIL EXTRA STRENGTH PO) Take 1,500 mg by mouth daily.      Marland Kitchen omeprazole (PRILOSEC) 20 MG capsule daily.      . potassium chloride SA (K-DUR,KLOR-CON) 20 MEQ tablet 2 (two) times daily.        Physical Examination  Filed Vitals:   08/11/12 1358  BP: 141/82  Pulse: 65  Resp:     Body mass index is 35.11 kg/(m^2).  General:  WDWN in NAD Gait: Normal HEENT: WNL Eyes: Pupils equal Pulmonary: normal non-labored breathing , without Rales, rhonchi,  wheezing Cardiac: RRR, without  Murmurs, rubs or gallops; Abdomen: soft, NT, no masses Skin: no rashes, ulcers noted  Vascular Exam Pulses: 3+ radial pulses bilaterally Carotid bruits: Carotid pulses to auscultation no bruits are heard Extremities without ischemic changes, no Gangrene , no cellulitis; no open wounds;  Musculoskeletal: no muscle wasting or  atrophy   Neurologic: A&O X 3; Appropriate Affect ; SENSATION: normal; MOTOR FUNCTION:  moving all extremities equally. Speech is fluent/normal  Non-Invasive Vascular Imaging CAROTID DUPLEX 08/11/2012  Right ICA 0 - 19% stenosis Left ICA 40 - 59 % stenosis   ASSESSMENT/PLAN: Asymptomatic patient status post right CEA and doing well. The patient has moderate left carotid stenosis. The patient will followup in one year with repeat carotid duplex, her questions were encouraged and answered, she is in agreement with this plan.  Lauree Chandler ANP   Clinic MD: Imogene Burn

## 2013-04-24 DIAGNOSIS — I4891 Unspecified atrial fibrillation: Secondary | ICD-10-CM

## 2013-04-26 DIAGNOSIS — I480 Paroxysmal atrial fibrillation: Secondary | ICD-10-CM

## 2013-04-26 HISTORY — DX: Paroxysmal atrial fibrillation: I48.0

## 2013-05-07 ENCOUNTER — Ambulatory Visit: Payer: Medicare Other | Admitting: Cardiovascular Disease

## 2013-05-08 ENCOUNTER — Encounter: Payer: Self-pay | Admitting: *Deleted

## 2013-05-08 DIAGNOSIS — I10 Essential (primary) hypertension: Secondary | ICD-10-CM

## 2013-05-08 DIAGNOSIS — E785 Hyperlipidemia, unspecified: Secondary | ICD-10-CM

## 2013-05-08 DIAGNOSIS — K219 Gastro-esophageal reflux disease without esophagitis: Secondary | ICD-10-CM | POA: Insufficient documentation

## 2013-05-08 DIAGNOSIS — I82409 Acute embolism and thrombosis of unspecified deep veins of unspecified lower extremity: Secondary | ICD-10-CM | POA: Insufficient documentation

## 2013-05-09 ENCOUNTER — Encounter: Payer: Self-pay | Admitting: Cardiology

## 2013-05-09 ENCOUNTER — Telehealth: Payer: Self-pay

## 2013-05-09 ENCOUNTER — Ambulatory Visit (INDEPENDENT_AMBULATORY_CARE_PROVIDER_SITE_OTHER): Payer: Medicare Other | Admitting: Cardiology

## 2013-05-09 VITALS — BP 136/90 | HR 67 | Ht 65.0 in | Wt 210.0 lb

## 2013-05-09 DIAGNOSIS — G8929 Other chronic pain: Secondary | ICD-10-CM

## 2013-05-09 DIAGNOSIS — I10 Essential (primary) hypertension: Secondary | ICD-10-CM | POA: Insufficient documentation

## 2013-05-09 DIAGNOSIS — Z720 Tobacco use: Secondary | ICD-10-CM | POA: Insufficient documentation

## 2013-05-09 DIAGNOSIS — I679 Cerebrovascular disease, unspecified: Secondary | ICD-10-CM

## 2013-05-09 DIAGNOSIS — R079 Chest pain, unspecified: Secondary | ICD-10-CM | POA: Insufficient documentation

## 2013-05-09 DIAGNOSIS — E785 Hyperlipidemia, unspecified: Secondary | ICD-10-CM

## 2013-05-09 DIAGNOSIS — M545 Low back pain: Secondary | ICD-10-CM

## 2013-05-09 MED ORDER — VERAPAMIL HCL ER 240 MG PO TBCR: 240.0000 mg | EXTENDED_RELEASE_TABLET | Freq: Every day | ORAL | Status: DC

## 2013-05-09 MED ORDER — ATENOLOL 50 MG PO TABS: 50.0000 mg | ORAL_TABLET | Freq: Every day | ORAL | Status: DC

## 2013-05-09 MED ORDER — CLONIDINE HCL 0.2 MG/24HR TD PTWK: 1.0000 | MEDICATED_PATCH | TRANSDERMAL | Status: DC

## 2013-05-09 MED ORDER — CLONIDINE HCL 0.1 MG PO TABS: 0.1000 mg | ORAL_TABLET | Freq: Two times a day (BID) | ORAL | Status: DC

## 2013-05-09 NOTE — Patient Instructions (Addendum)
Your physician recommends that you schedule a follow-up appointment in: FOLLOW UP WEEK OF 5/26  Your physician has recommended you make the following change in your medication:   1) STOP TAKING TOPROL 2) START VERAPAMIL 240MG  ONCE DAILY 3) START CLONIDINE PATCH LEVEL TWO (0.2) CHANGE WEEKLY 4) START ATENOLOL 50MG  ONCE DAILY  Your physician recommends THAT YOU ELEVATE YOUR LEGS WHILE NOT AMBULATING  Your physician recommends THAT YOU CALL OUR OFFICE IF YOUR BLOOD PRESSURE IS ELEVATED ABOVE 140/90 FOR CONSECUTIVE OF 5 DAYS [phone]  1.5 Gram Low Sodium Diet A 1.5 gram sodium diet restricts the amount of salt in your diet. You can have no more than 1.5 grams (1500 miligrams) in 1 day. This can help lessen your risk for developing high blood pressure. This diet may also reduce your chance of having a heart attack or stroke. It is important that you know what to look for when choosing foods and drinks.  HOME CARE   Do not add salt to food.  Avoid convenience items and fast food.  Choose unsalted snack foods.  Buy products labeled "low sodium" or "no salt added" when possible.  Check food labels to learn how much sodium is in 1 serving.  When eating at a restaurant, ask that your food be prepared with less salt or none, if possible. The nutrition facts label is a good place to find how much sodium is in foods. Look for products with no more than 400 mg of sodium per serving. Remember that 1.5 g = 1500 mg. CHOOSING FOODS Grains  Avoid: Salted crackers and snack items. Some cereals, including instant hot cereals. Bread stuffing and biscuit mixes. Seasoned rice or pasta mixes.  Choose: Unsalted snack items. Low-sodium cereals, oats, puffed wheat and rice, shredded wheat. English muffins and bread. Pasta. Meats  Avoid:  Salted, canned, smoked, spiced, pickled meats, including fish and poultry. Bacon, ham, sausage, cold cuts, hot dogs, anchovies.  Choose: Low-sodium canned tuna and  salmon. Fresh or frozen meat, poultry, and fish. Dairy  Avoid: Processed cheese and spreads. Cottage cheese. Buttermilk and condensed milk. Regular cheese.  Choose:  Milk. Low-sodium cottage cheese. Yogurt. Sour cream. Low-sodium cheese. Fruits and Vegetables  Avoid:  Regular canned vegetables. Regular canned tomato sauce and paste. Frozen vegetables in sauces. Olives. Rosita Fire. Relishes. Sauerkraut.  Choose:  Low-sodium canned vegetables. Low-sodium tomato sauce and paste. Frozen or fresh vegetables. Fresh and frozen fruit. Condiments  Avoid:  Canned and packaged gravies. Worcestershire sauce. Tartar sauce. Barbecue sauce. Soy sauce. Steak sauce. Ketchup. Onion, garlic, and table salt. Meat flavorings and tenderizers.  Choose:  Fresh and dried herbs and spices. Low-sodium varieties of mustard and ketchup. Lemon juice. Tabasco sauce. Horseradish. Document Released: 01/15/2011 Document Revised: 03/06/2012 Document Reviewed: 01/15/2011 [facility] Patient Information 2013 [place], [place].

## 2013-05-09 NOTE — Telephone Encounter (Signed)
Sent in RX in clonidine tablets 0.1mg  twice daily, left vm on pt phone to advise if any further assistance needed call back

## 2013-05-09 NOTE — Telephone Encounter (Signed)
Pt was just seen and we called in a"patch" for her. She can not afford it. Can we call her in something else?

## 2013-05-09 NOTE — Progress Notes (Deleted)
Name: Martha Torres    DOB: 1946-02-29  Age: 67 y.o.  MR#: [id number]       PCP:  Toma Deiters, MD      Insurance: Payor: [employer] MEDICARE / Plan: AARP MEDICARE COMPLETE / Product Type: *No Product type* /   CC:   No chief complaint on file. surgical clearance for back surgery  VS Filed Vitals:   05/09/13 1115  BP: 136/90  Pulse: 67  Height: 5\' 5"  (1.651 m)  Weight: 210 lb (95.255 kg)  SpO2: 95%    Weights Current Weight  05/09/13 210 lb (95.255 kg)  08/11/12 211 lb (95.709 kg)    Blood Pressure  BP Readings from Last 3 Encounters:  05/09/13 136/90  08/11/12 141/82     Admit date:  (Not on file) Last encounter with RMR:  Visit date not found   Allergy Review of patient's allergies indicates no known allergies.  Current Outpatient Prescriptions  Medication Sig Dispense Refill  . aspirin 325 MG tablet Take 325 mg by mouth daily.      . Cholecalciferol (VITAMIN D-3 PO) Take 2,000 Units by mouth daily.      Marland Kitchen gabapentin (NEURONTIN) 300 MG capsule every 8 (eight) hours.      Marland Kitchen levothyroxine (SYNTHROID, LEVOTHROID) 50 MCG tablet Take 50 mcg by mouth daily before breakfast.      . lisinopril (PRINIVIL,ZESTRIL) 20 MG tablet Twice daily.      . metoprolol (LOPRESSOR) 50 MG tablet Take 50 mg by mouth 2 (two) times daily.      . Omega-3 Fatty Acids (FISH OIL) 1200 MG CAPS Take by mouth daily.      Marland Kitchen omeprazole (PRILOSEC) 20 MG capsule daily.      . potassium chloride SA (K-DUR,KLOR-CON) 20 MEQ tablet 2 (two) times daily.      . simvastatin (ZOCOR) 20 MG tablet Take 20 mg by mouth every evening.      . chlorthalidone (HYGROTON) 25 MG tablet Take  One tablet daily       No current facility-administered medications for this visit.    Discontinued Meds:    Medications Discontinued During This Encounter  Medication Reason  . aspirin 81 MG tablet Error  . Omega-3 Fatty Acids (FISH OIL EXTRA STRENGTH PO) Error  . tiZANidine (ZANAFLEX) 4 MG capsule Error  .  metoCLOPramide (REGLAN) 10 MG tablet Error  . lansoprazole (PREVACID) 30 MG capsule Error  . diclofenac (VOLTAREN) 75 MG EC tablet Error  . cyclobenzaprine (FLEXERIL) 10 MG tablet Error  . atorvastatin (LIPITOR) 10 MG tablet Error  . atenolol-chlorthalidone (TENORETIC) 50-25 MG per tablet Error    Patient Active Problem List   Diagnosis Date Noted  . Hypertension   . Hyperlipidemia   . Chest pain   . Tobacco abuse   . Cerebrovascular disease   . DVT (deep venous thrombosis) 05/08/2013  . GERD (gastroesophageal reflux disease) 05/08/2013    LABS No results found for this basename: na, k, cl, co2, glucose, bun, creatinine, calcium, gfrnonaa, gfraa   CMP  No results found for this basename: na, k, cl, co2, glucose, bun, creatinine, calcium, prot, albumin, ast, alt, alkphos, bilitot, gfrnonaa, gfraa    No results found for this basename: wbc, hgb, hct, mcv, platelets    Lipid Panel  No results found for this basename: chol, trig, hdl, cholhdl, vldl, ldlcalc    ABG No results found for this basename: phart, pco2, pco2art, po2, po2art, hco3, tco2, acidbasedef, o2sat  No results found for this basename: TSH   BNP (last 3 results) No results found for this basename: PROBNP,  in the last 8760 hours Cardiac Panel (last 3 results) No results found for this basename: CKTOTAL, CKMB, TROPONINI, RELINDX,  in the last 72 hours  Iron/TIBC/Ferritin No results found for this basename: iron, tibc, ferritin     EKG Orders placed in visit on 05/09/13  . EKG 12-LEAD     Prior Assessment and Plan Problem List as of 05/09/2013   DVT (deep venous thrombosis)   GERD (gastroesophageal reflux disease)   Hypertension   Hyperlipidemia   Chest pain   Tobacco abuse   Cerebrovascular disease       Imaging: No results found.

## 2013-05-09 NOTE — Progress Notes (Signed)
Patient ID: Martha Torres, female   DOB: 28-Sep-1946, 67 y.o.   MRN: [id number]  HPI: Initial Cardiology evaluation for this nice woman kindly referred by Toma Deiters, MD for assessment of cardiac risk prior to planned lumbosacral spine surgery by Dr. Channing Mutters, reportedly the fifth surgical procedure on the lower back.  She has no history of cardiac disease, but has had hypertension that has recently been inadequately controlled. She was admitted to [facility] within the past few weeks for atrial fibrillation with an initial ventricular rate of approximately 135 bpm and spontaneously converted to sinus rhythm.  Those records were obtained and reviewed.  Admission medications included atenolol/HCTZ. Metoprolol was substituted, which patient is convinced has caused edema.  Current Outpatient Prescriptions on File Prior to Visit  Medication Sig Dispense Refill  . gabapentin (NEURONTIN) 300 MG capsule every 8 (eight) hours.      Marland Kitchen lisinopril (PRINIVIL,ZESTRIL) 20 MG tablet Twice daily.      Marland Kitchen omeprazole (PRILOSEC) 20 MG capsule daily.      . potassium chloride SA (K-DUR,KLOR-CON) 20 MEQ tablet 2 (two) times daily.       No current facility-administered medications on file prior to visit.   No Known Allergies  Past Medical History  Diagnosis Date  . Hypertension   . DVT (deep venous thrombosis)   . Hyperlipidemia   . GERD (gastroesophageal reflux disease)     H/o stricture  . 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 Degenerative joint disease   . Tobacco abuse   . Cerebrovascular disease     Right CEA in 2002; duplex in 06/2011: Patent right; 40-59% left ICA    Past Surgical History  Procedure Laterality Date  . Carotid endarterectomy Right 10/25/2001    Subsequent to episode of syncope  . Laminectomy  2000  . Vaginal hysterectomy    . Laparoscopic cholecystectomy    . Back surgery  4 TIMES     History reviewed. No pertinent family history.  History   Social History  . Marital Status: Widowed    Spouse Name: N/A    Number of Children: N/A  . Years of Education: N/A   Occupational History  . Not on file.   Social History Main Topics  . Smoking status: Never Smoker   . Smokeless tobacco: Not on file  . Alcohol Use: Yes  . Drug Use: Yes  . Sexually Active: Not on file   Other Topics Concern  . Not on file   Social History Narrative  . No narrative on file    ROS: Intermittent pedal edema; colonoscopy approximately 2004; easy bruising..  All other systems reviewed and are negative.  PHYSICAL EXAM: BP 136/90  Pulse 67  Ht 5\' 5"  (1.651 m)  Wt 95.255 kg (210 lb)  BMI 34.95 kg/m2  SpO2 95%;  Body mass index is 34.95 kg/(m^2).   General-Well-developed; no acute distress Body Habitus-obese HEENT-[place]/AT; PERRL; EOM intact; conjunctiva and lids nl Neck-No JVD; right carotid bruit Endocrine-No thyromegaly Lungs-Clear lung fields; resonant percussion; normal I-to-E ratio Cardiovascular- normal PMI; normal S1 and S2; S4 versus split S1 Abdomen-BS normal; soft and non-tender without masses or organomegaly Musculoskeletal-No deformities, cyanosis or clubbing Neurologic-Nl cranial nerves; symmetric strength and tone Skin- Warm, no significant lesions Extremities-Nl distal pulses; 1-2+ edema  EKG:  Normal sinus rhythm; voltage criteria for LVH; otherwise normal. No previous tracing for comparison.   [place] Bing, MD  05/09/2013  4:49 PM  ASSESSMENT AND PLAN

## 2013-05-10 ENCOUNTER — Encounter: Payer: Self-pay | Admitting: Cardiology

## 2013-05-10 DIAGNOSIS — M545 Other chronic pain: Secondary | ICD-10-CM | POA: Insufficient documentation

## 2013-05-10 NOTE — Assessment & Plan Note (Signed)
Planned surgery is relatively low risk. No need for modification of medical regime, other than to adequately control hypertension, or pre-operative testing.

## 2013-05-10 NOTE — Assessment & Plan Note (Addendum)
Chest pain in 2006 led to negative cardiac catheterization. Patient continues to have substantial vascular risk factors, and progression of disease is certainly a possibility. In the absence of symptoms, no preoperative testing required.

## 2013-05-10 NOTE — Assessment & Plan Note (Signed)
No serum lipid levels available for review. We will seek prior testing from patient's PCP.

## 2013-05-10 NOTE — Assessment & Plan Note (Addendum)
Followed by Vascular Surgery, Dr. Edilia Bo. Patient claims to be up to date, but most recent office visit appears to have occurred in 2012. Patient advised to schedule a return visit for clinical reassessment and repeat carotid ultrasound.

## 2013-05-10 NOTE — Assessment & Plan Note (Signed)
Control of hypertension is inadequate. Patient's diuretic has already been resumed. Since she is convinced that metoprolol is causing edema, that medication will be discontinued and atenolol substituted at her prior dose. Verapamil and clonidine will be added to her medical regime. She was instructed regarding leg elevation low-salt diet. Unfortunately, patient is leaving town and will return only 2 days prior to her planned surgery. We will see her then and determine if the procedure needs to be rescheduled.

## 2013-05-11 ENCOUNTER — Encounter: Payer: Self-pay | Admitting: Cardiology

## 2013-05-11 ENCOUNTER — Telehealth: Payer: Self-pay | Admitting: *Deleted

## 2013-05-11 NOTE — Telephone Encounter (Signed)
Pt was started on a couple new medications and she is feeling dizzy. Before she took her medications this am her BP 122/75. She took her meds at 6:30am and now her BP is 94/61.

## 2013-05-11 NOTE — Telephone Encounter (Signed)
Called pt to clarify that she is taking all the medications that were prescribed to her at recent OV 05-09-13 noted as follows:  1) STOP TAKING TOPROL  2) START VERAPAMIL 240MG  ONCE DAILY  3) START CLONIDINE PATCH LEVEL TWO (0.2) CHANGE WEEKLY (TAKING TABS PER TOO EXPENSIVE FOR PATCH) 4) START ATENOLOL 50MG  ONCE DAILY  Pt advised BP range from 10am on 05-10-13 as 137/67, 6pm 116/69, 9:30pm 123/74, pt states she is not as dizzy as she was at previous phone call, felt better after she ate and laid down, took BP while on phone with this nurse noted as 115/77 HR 75, pt to leave for the beach today and wanted to make sure no further instructions were needed, advised to call cell [phone] or [phone] gave verbal ok to leave detailed message on secure line, please advise

## 2013-05-14 NOTE — Telephone Encounter (Signed)
Patient is now doing well. Continue all medications as she is currently taking.

## 2013-05-14 NOTE — Telephone Encounter (Signed)
.  left message to have patient return my call.  

## 2013-05-16 NOTE — Telephone Encounter (Signed)
.  left message to have patient return my call.  

## 2013-05-18 NOTE — Telephone Encounter (Signed)
.  left message to have patient return my call. Also to advise pt apt for 05-22-13 needs to be moved to the afternoon or rescheduled

## 2013-05-22 ENCOUNTER — Ambulatory Visit: Payer: Medicare Other | Admitting: Cardiology

## 2013-05-22 NOTE — Telephone Encounter (Signed)
Pt walked into office for her apt today, was advised verbally by [facility] TJ that several attempts had been made to contact pt, pt noted she was at the beach, pt noted her BP was fine at pre-op visit this morning but she feels that she is taking too much fluid medication per going to the bathroom 3-4 times nightly,advised this is normal for the fluid medication,pt advised she will discuss further when she has her f/u with rr rescheduled, this nurse advised YL verbally about pt re-schedule issue per surgery

## 2013-05-23 HISTORY — PX: SPINE SURGERY: SHX786

## 2013-05-27 DIAGNOSIS — A4902 Methicillin resistant Staphylococcus aureus infection, unspecified site: Secondary | ICD-10-CM

## 2013-05-27 HISTORY — DX: Methicillin resistant Staphylococcus aureus infection, unspecified site: A49.02

## 2013-06-19 ENCOUNTER — Ambulatory Visit: Payer: Medicare Other | Admitting: Cardiology

## 2013-06-27 ENCOUNTER — Ambulatory Visit: Payer: Medicare Other | Admitting: Cardiology

## 2013-08-15 ENCOUNTER — Encounter: Payer: Self-pay | Admitting: Family

## 2013-08-16 ENCOUNTER — Other Ambulatory Visit (INDEPENDENT_AMBULATORY_CARE_PROVIDER_SITE_OTHER): Payer: Medicare Other | Admitting: *Deleted

## 2013-08-16 ENCOUNTER — Encounter: Payer: Self-pay | Admitting: Family

## 2013-08-16 ENCOUNTER — Ambulatory Visit (INDEPENDENT_AMBULATORY_CARE_PROVIDER_SITE_OTHER): Payer: Medicare Other | Admitting: Family

## 2013-08-16 DIAGNOSIS — I6529 Occlusion and stenosis of unspecified carotid artery: Secondary | ICD-10-CM

## 2013-08-16 DIAGNOSIS — Z48812 Encounter for surgical aftercare following surgery on the circulatory system: Secondary | ICD-10-CM | POA: Insufficient documentation

## 2013-08-16 NOTE — Progress Notes (Signed)
Established Carotid Patient   History of Present Illness  Martha Torres is a 67 y.o. female who had a right CEA, 2002 by Dr. Madilyn Fireman. Hx DVT left leg a couple of years ago; finished course of coumadin. Recenly had a-fib, then back surgery, May, 2014. Her lower legs started to swell about a month ago at which time her PCP started her on Lasix which has helped the swelling; her lower legs feel "sore". States she cannot lean forward enough to put on compression hose since her back surgery. No TIA or stroke sx's. No claudication.  The patient denies amaurosis fugax or monocular blindness.  The patient  denies facial drooping.  Pt. denies hemiplegia.  The patient denies receptive or expressive aphasia.    Pt Diabetic: No Pt smoker: No (former)  Pt meds include: Statin : Yes Betablocker: Yes ASA: Yes Other anticoagulants/antiplatelets: no   Past Medical History  Diagnosis Date  . Hypertension   . DVT (deep venous thrombosis)   . Hyperlipidemia   . Gastroesophageal reflux     H/o stricture & hiatal hernia  . 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 Degenerative joint disease   . Tobacco abuse   . Cerebrovascular disease     Right CEA in 2002; duplex in 06/2011: Patent right; 40-59% left ICA  . Chronic low back pain     MRI 2014: Spondylolisthesis of L4-L5 with foraminal narrowing, most prominent on the right and facet arthropathy.  L4-L5 surgery planned for 04/2013 with left-sided pedicle screw fixation.  . Paroxysmal atrial fibrillation 04/2013    04/2013: [facility]; LE Doppler-neg;  . Irregular heart beat   . MRSA (methicillin resistant Staphylococcus aureus) June 2014    Social History History  Substance Use Topics  . Smoking status: Former Games developer  . Smokeless tobacco: Never Used  . Alcohol Use: 0.5 oz/week    1 drink(s) per week    Family History Family History   Problem Relation Age of Onset  . Heart attack Father     Also mother  . Cancer - Prostate Father   . Heart disease Father     Heart Disease before age 72  . Hyperlipidemia Father   . Hypertension Sister   . Hyperlipidemia Sister   . Stroke Mother   . Heart disease Mother     Heart Disease before age 60  . Hyperlipidemia Mother   . Heart attack Mother   . Diabetes Sister   . Hyperlipidemia Brother     Surgical History Past Surgical History  Procedure Laterality Date  . Carotid endarterectomy Right 10/25/2001    Subsequent to episode of syncope  . Laminectomy  2000    L5-S1 discectomy and laminectomy; 3 other surgical procedures subsequently performed  . Vaginal hysterectomy    . Laparoscopic cholecystectomy    . Spine surgery  05-23-13    X's 5 surgeries    No Known Allergies  Current Outpatient Prescriptions  Medication Sig Dispense Refill  . aspirin 325 MG tablet Take 325 mg by mouth daily.      Marland Kitchen atenolol-chlorthalidone (TENORETIC) 50-25 MG per tablet daily.      . Cholecalciferol (VITAMIN D-3 PO) Take 2,000 Units by mouth daily.      . furosemide (LASIX) 40 MG tablet daily.      Marland Kitchen gabapentin (NEURONTIN) 300 MG capsule every 8 (eight) hours.      Marland Kitchen levothyroxine (SYNTHROID,  LEVOTHROID) 50 MCG tablet Take 50 mcg by mouth daily before breakfast.      . Omega-3 Fatty Acids (FISH OIL) 1200 MG CAPS Take by mouth daily.      Marland Kitchen omeprazole (PRILOSEC) 20 MG capsule daily.      . polyethylene glycol powder (GLYCOLAX/MIRALAX) powder continuous as needed.      . potassium chloride (K-DUR) 10 MEQ tablet 10 tablets 2 (two) times daily at 8 am and 10 pm. Two am and pm      . simvastatin (ZOCOR) 20 MG tablet Take 20 mg by mouth every evening.      . verapamil (CALAN-SR) 240 MG CR tablet Take 1 tablet (240 mg total) by mouth at bedtime.  90 tablet  1  . atenolol (TENORMIN) 50 MG tablet Take 1 tablet (50 mg total) by mouth daily.  30 tablet  6  . chlorthalidone (HYGROTON) 25 MG  tablet Take  One tablet daily      . cloNIDine (CATAPRES) 0.1 MG tablet Take 1 tablet (0.1 mg total) by mouth 2 (two) times daily.  60 tablet  11  . lisinopril (PRINIVIL,ZESTRIL) 20 MG tablet Twice daily.      . metoprolol (LOPRESSOR) 50 MG tablet Take 50 mg by mouth 2 (two) times daily.      . potassium chloride SA (K-DUR,KLOR-CON) 20 MEQ tablet 2 (two) times daily.       No current facility-administered medications for this visit.    Review of Systems : [x]  Positive   [ ]  Denies  General:[ ]  Weight loss,  [ ]  Weight gain, [ ]  Loss of appetite, [ ]  Fever, [ ]  chills  Neurologic: [ ]  Dizziness, [ ]  Blackouts, [ ]  Headaches, [ ]  Seizure [ ]  Stroke, [ ]  "Mini stroke", [ ]  Slurred speech, [ ]  Temporary blindness;  [ ] weakness,  Ear/Nose/Throat: [ ]  Change in hearing, [ ]  Nose bleeds, [ ]  Hoarseness  Vascular:[ ]  Pain in legs with walking, [ ]  Pain in feet while lying flat , [ ]   Non-healing ulcer, Arly.Keller ] Blood clot in vein,    Pulmonary: [ ]  Home oxygen, [ ]   Productive cough, [ ]  Bronchitis, [ ]  Coughing up blood,  [ ]  Asthma, [ ]  Wheezing  Musculoskeletal:  [ ]  Arthritis, [ ]  Joint pain, Arly.Keller ] low back pain  Cardiac: [ ]  Chest pain, [ ]  Shortness of breath when lying flat, [ ]  Shortness of breath with exertion, [ ]  Palpitations, [ ]  Heart murmur, Arly.Keller ]  Atrial fibrillation, since resolved  Hematologic:[ ]  Easy Bruising, [ ]  Anemia; [ ]  Hepatitis  Psychiatric: [ ]   Depression, [ ]  Anxiety   Gastrointestinal: [ ]  Black stool, [ ]  Blood in stool, [ ]  Peptic ulcer disease,  [ ]  Gastroesophageal Reflux, [ ]  Trouble swallowing, [ ]  Diarrhea, [ ]  Constipation  Urinary: [ ]  chronic Kidney disease, [ ]  on HD, [ ]  Burning with urination, [ ]  Frequent urination, [ ]  Difficulty urinating;   Skin: [ ]  Rashes, [ ]  Wounds    Physical Examination  Filed Vitals:   08/16/13 1547  BP: 131/72  Pulse: 63  Resp:   Body mass index is 34.45 kg/(m^2).  General: WDWN female in NAD GAIT:  antalgic Eyes: PERRLA Pulmonary:  CTAB, Negative  Rales, Negative rhonchi, & Negative wheezing,  Cardiac: regular Rhythm ,  Negative Murmurs, lower legs swelling, tender, 1+pitting edema bilaterally.  Vascular:      Vessel Right Left  Radial Palpable  Palpable  Carotid audible, without bruit audible, without bruit  Aorta Not palpable N/A  Femoral Palpable Palpable  Popliteal Not palpable Not palpable  PT Not Palpable, but audible with Doppler Not Palpable, but audible with Doppler  DP Not Palpable, but audible with Doppler Not Palpable, but audible with Doppler       Gastrointestinal: soft, nontender, BS WNL, no r/g,  negative masses;   Musculoskeletal: Negative muscle atrophy/wasting. M/S 5/5 throughout  except lower legs at 1/5 since recent low back surgery, Extremities without ischemic changes.  Neurologic: A&O X 3; Appropriate Affect ; SENSATION ;normal; MOTOR FUNCTION: no muscle wasting or atrophy Speech is normal CN 2-12 intact, Pain and light touch intact in extremities, Motor exam as listed above   Non-Invasive Vascular Imaging CAROTID DUPLEX 08/16/2013   Doppler velocities suggest no right internal carotid artery stenosis and a 40-50% stenosis of the left proximal internal carotid artery; this remains unchanged from the previous Duplex done 08/11/2012.  Assessment: Martha Torres is a 67 y.o. female who is asymptomatic and presents for post right CEA (2002) surveillance.  Her carotid artery Duplex today suggests no change from last year. Suggest yearly surveillance unless she has TIA symptoms at which time she should come in sooner for evaluation. She has new onset (in the last month) swelling of lower legs with venous stasis changes. Both lower legs are tender and ache, left leg worse than right. Since she has a history of left lower leg DVT, I suggested that our vascular lab perform a venous Duplex to evaluate for DVT, but she declined and stated she will ask her PCP to  investigate this. Should she change her mind we will be glad to arrange this for her ASAP.  Plan: Follow-up in 1 year with Carotid Duplex scan. Since she has significant tenderness and mild swelling in lower legs for the past month, and has a history of DVT in left lower leg, I advised her to let our [place] lab perform a venous Duplex to evaluate for DVT. But she declined and stated she will ask her PCP to check for this. She knows to return here sooner should she need to do so.  I discussed in depth with the patient the nature of atherosclerosis, and emphasized the importance of maximal medical management including strict control of blood pressure, blood glucose, and lipid levels, obtaining regular exercise, and continued cessation of smoking.  The patient is aware that without maximal medical management the underlying atherosclerotic disease process will progress, limiting the benefit of any interventions. Pt was given verbal information regarding stroke symptoms and prevention and information about DVT risk factors; she has been treated for DVT in the past. Thank you for allowing [place] to participate in this patient's care.  Charisse March, RN, MSN, FNP-C [facility] Office: [phone]  Clinic Physician: Darrick Penna  08/16/2013 4:20 PM

## 2013-09-06 ENCOUNTER — Other Ambulatory Visit: Payer: Self-pay | Admitting: *Deleted

## 2013-09-06 DIAGNOSIS — Z48812 Encounter for surgical aftercare following surgery on the circulatory system: Secondary | ICD-10-CM

## 2013-09-18 ENCOUNTER — Encounter: Payer: Self-pay | Admitting: *Deleted

## 2014-02-14 ENCOUNTER — Other Ambulatory Visit: Payer: Self-pay | Admitting: Family

## 2014-02-14 DIAGNOSIS — I6529 Occlusion and stenosis of unspecified carotid artery: Secondary | ICD-10-CM

## 2014-02-14 DIAGNOSIS — Z48812 Encounter for surgical aftercare following surgery on the circulatory system: Secondary | ICD-10-CM

## 2014-08-22 ENCOUNTER — Other Ambulatory Visit (HOSPITAL_COMMUNITY): Payer: Medicare Other

## 2014-08-22 ENCOUNTER — Ambulatory Visit: Payer: Medicare Other | Admitting: Family

## 2014-10-10 ENCOUNTER — Other Ambulatory Visit (HOSPITAL_COMMUNITY): Payer: Medicare Other

## 2014-10-10 ENCOUNTER — Ambulatory Visit: Payer: Medicare Other | Admitting: Family

## 2014-11-05 ENCOUNTER — Encounter: Payer: Self-pay | Admitting: Family

## 2014-11-06 ENCOUNTER — Ambulatory Visit (HOSPITAL_COMMUNITY)
Admission: RE | Admit: 2014-11-06 | Discharge: 2014-11-06 | Disposition: A | Discharge status: Home / Self Care | Payer: Medicare Other | Source: Ambulatory Visit | Attending: Family | Admitting: Family

## 2014-11-06 ENCOUNTER — Ambulatory Visit: Payer: Medicare Other | Admitting: Family

## 2014-11-06 ENCOUNTER — Other Ambulatory Visit (HOSPITAL_COMMUNITY): Payer: Medicare Other

## 2014-11-06 ENCOUNTER — Encounter: Payer: Self-pay | Admitting: Family

## 2014-11-06 ENCOUNTER — Ambulatory Visit (INDEPENDENT_AMBULATORY_CARE_PROVIDER_SITE_OTHER): Payer: Medicare Other | Admitting: Family

## 2014-11-06 VITALS — BP 158/82 | HR 63 | Resp 16 | Ht 65.0 in | Wt 216.0 lb

## 2014-11-06 DIAGNOSIS — Z48812 Encounter for surgical aftercare following surgery on the circulatory system: Secondary | ICD-10-CM

## 2014-11-06 DIAGNOSIS — M25561 Pain in right knee: Secondary | ICD-10-CM

## 2014-11-06 DIAGNOSIS — I6523 Occlusion and stenosis of bilateral carotid arteries: Secondary | ICD-10-CM | POA: Insufficient documentation

## 2014-11-06 DIAGNOSIS — M25569 Pain in unspecified knee: Secondary | ICD-10-CM | POA: Insufficient documentation

## 2014-11-06 NOTE — Patient Instructions (Addendum)
Stroke Prevention Some medical conditions and behaviors are associated with an increased chance of having a stroke. You may prevent a stroke by making healthy choices and managing medical conditions. HOW CAN I REDUCE MY RISK OF HAVING A STROKE?   Stay physically active. Get at least 30 minutes of activity on most or all days.  Do not smoke. It may also be helpful to avoid exposure to secondhand smoke.  Limit alcohol use. Moderate alcohol use is considered to be:  No more than 2 drinks per day for men.  No more than 1 drink per day for nonpregnant women.  Eat healthy foods. This involves:  Eating 5 or more servings of fruits and vegetables a day.  Making dietary changes that address high blood pressure (hypertension), high cholesterol, diabetes, or obesity.  Manage your cholesterol levels.  Making food choices that are high in fiber and low in saturated fat, trans fat, and cholesterol may control cholesterol levels.  Take any prescribed medicines to control cholesterol as directed by your health care provider.  Manage your diabetes.  Controlling your carbohydrate and sugar intake is recommended to manage diabetes.  Take any prescribed medicines to control diabetes as directed by your health care provider.  Control your hypertension.  Making food choices that are low in salt (sodium), saturated fat, trans fat, and cholesterol is recommended to manage hypertension.  Take any prescribed medicines to control hypertension as directed by your health care provider.  Maintain a healthy weight.  Reducing calorie intake and making food choices that are low in sodium, saturated fat, trans fat, and cholesterol are recommended to manage weight.  Stop drug abuse.  Avoid taking birth control pills.  Talk to your health care provider about the risks of taking birth control pills if you are over 35 years old, smoke, get migraines, or have ever had a blood clot.  Get evaluated for sleep  disorders (sleep apnea).  Talk to your health care provider about getting a sleep evaluation if you snore a lot or have excessive sleepiness.  Take medicines only as directed by your health care provider.  For some people, aspirin or blood thinners (anticoagulants) are helpful in reducing the risk of forming abnormal blood clots that can lead to stroke. If you have the irregular heart rhythm of atrial fibrillation, you should be on a blood thinner unless there is a good reason you cannot take them.  Understand all your medicine instructions.  Make sure that other conditions (such as anemia or atherosclerosis) are addressed. SEEK IMMEDIATE MEDICAL CARE IF:   You have sudden weakness or numbness of the face, arm, or leg, especially on one side of the body.  Your face or eyelid droops to one side.  You have sudden confusion.  You have trouble speaking (aphasia) or understanding.  You have sudden trouble seeing in one or both eyes.  You have sudden trouble walking.  You have dizziness.  You have a loss of balance or coordination.  You have a sudden, severe headache with no known cause.  You have new chest pain or an irregular heartbeat. Any of these symptoms may represent a serious problem that is an emergency. Do not wait to see if the symptoms will go away. Get medical help at once. Call your local emergency services (911 in [place] Do not drive yourself to the hospital. Document Released: 01/20/2005 Document Revised: 04/29/2014 Document Reviewed: 06/15/2013 [employer] Patient Information 2015 [employer]. This information is not intended to replace advice given   to you by your health care provider. Make sure you discuss any questions you have with your health care provider.   Venous Stasis or Chronic Venous Insufficiency Chronic venous insufficiency, also called venous stasis, is a condition that affects the veins in the legs. The condition prevents blood from being pumped  through these veins effectively. Blood may no longer be pumped effectively from the legs back to the heart. This condition can range from mild to severe. With proper treatment, you should be able to continue with an active life. CAUSES  Chronic venous insufficiency occurs when the vein walls become stretched, weakened, or damaged or when valves within the vein are damaged. Some common causes of this include:  High blood pressure inside the veins (venous hypertension).  Increased blood pressure in the leg veins from long periods of sitting or standing.  A blood clot that blocks blood flow in a vein (deep vein thrombosis).  Inflammation of a superficial vein (phlebitis) that causes a blood clot to form. RISK FACTORS Various things can make you more likely to develop chronic venous insufficiency, including:  Family history of this condition.  Obesity.  Pregnancy.  Sedentary lifestyle.  Smoking.  Jobs requiring long periods of standing or sitting in one place.  Being a certain age. Women in their 40s and 50s and men in their 70s are more likely to develop this condition. SIGNS AND SYMPTOMS  Symptoms may include:   Varicose veins.  Skin breakdown or ulcers.  Reddened or discolored skin on the leg.  Brown, smooth, tight, and painful skin just above the ankle, usually on the inside surface (lipodermatosclerosis).  Swelling. DIAGNOSIS  To diagnose this condition, your health care provider will take a medical history and do a physical exam. The following tests may be ordered to confirm the diagnosis:  Duplex ultrasound--A procedure that produces a picture of a blood vessel and nearby organs and also provides information on blood flow through the blood vessel.  Plethysmography--A procedure that tests blood flow.  A venogram, or venography--A procedure used to look at the veins using X-ray and dye. TREATMENT The goals of treatment are to help you return to an active life and to  minimize pain or disability. Treatment will depend on the severity of the condition. Medical procedures may be needed for severe cases. Treatment options may include:   Use of compression stockings. These can help with symptoms and lower the chances of the problem getting worse, but they do not cure the problem.  Sclerotherapy--A procedure involving an injection of a material that "dissolves" the damaged veins. Other veins in the network of blood vessels take over the function of the damaged veins.  Surgery to remove the vein or cut off blood flow through the vein (vein stripping or laser ablation surgery).  Surgery to repair a valve. HOME CARE INSTRUCTIONS   Wear compression stockings as directed by your health care provider.  Only take over-the-counter or prescription medicines for pain, discomfort, or fever as directed by your health care provider.  Follow up with your health care provider as directed. SEEK MEDICAL CARE IF:   You have redness, swelling, or increasing pain in the affected area.  You see a red streak or line that extends up or down from the affected area.  You have a breakdown or loss of skin in the affected area, even if the breakdown is small.  You have an injury to the affected area. SEEK IMMEDIATE MEDICAL CARE IF:   You have   an injury and open wound in the affected area.  Your pain is severe and does not improve with medicine.  You have sudden numbness or weakness in the foot or ankle below the affected area, or you have trouble moving your foot or ankle.  You have a fever or persistent symptoms for more than 2-3 days.  You have a fever and your symptoms suddenly get worse. MAKE SURE YOU:   Understand these instructions.  Will watch your condition.  Will get help right away if you are not doing well or get worse. Document Released: 04/18/2007 Document Revised: 10/03/2013 Document Reviewed: 08/20/2013 [facility] Patient Information 2015 [place], [place].  This information is not intended to replace advice given to you by your health care provider. Make sure you discuss any questions you have with your health care provider.   Measure your legs in the morning before you get out of bed: length of lower leg, circumference of calf, circumference of ankle, write down the measurements. Obtain 20-30 mm mercury graduated knee high compression hose; wear during the day, remove at night.

## 2014-11-06 NOTE — Progress Notes (Signed)
Established Carotid Patient   History of Present Illness  Martha Torres is a 68 y.o. female patient of Dr. Scot Dock who had a right CEA in 2002 by Dr. Amedeo Plenty. Hx DVT left leg about 2012; finished course of coumadin. Recenly had a-fib, then back surgery, May, 2014. Her lower legs started to swell in July 2014 at which time her PCP started her on Lasix which has helped the swelling; her lower legs feel "sore". States she cannot lean forward enough to put on compression hose since her back surgery. No TIA or stroke sx's.  No claudication. Since about August, 2015 her right thigh has intermittent pain. She has a history of 5 back surgeries.  The patient denies amaurosis fugax or monocular blindness. The patient denies facial drooping. Pt. denies hemiplegia. The patient denies receptive or expressive aphasia.   Pt Diabetic: Yes, diagnosed in 2015, on metformin only Pt smoker: No (quit about 2005)  Pt meds include: Statin : Yes Betablocker: Yes ASA: Yes Other anticoagulants/antiplatelets: no  Past Medical History  Diagnosis Date  . Hypertension   . DVT (deep venous thrombosis)   . Hyperlipidemia   . Gastroesophageal reflux     H/o stricture & hiatal hernia  . 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 Degenerative joint disease   . Tobacco abuse   . Cerebrovascular disease     Right CEA in 2002; duplex in 06/2011: Patent right; 40-59% left ICA  . Chronic low back pain     MRI 2014: Spondylolisthesis of L4-L5 with foraminal narrowing, most prominent on the right and facet arthropathy.  L4-L5 surgery planned for 04/2013 with left-sided pedicle screw fixation.  . Paroxysmal atrial fibrillation 04/2013    04/2013: [facility]; LE Doppler-neg;  . Irregular heart beat   . MRSA (methicillin resistant Staphylococcus aureus) June 2014  . Carotid artery occlusion   . Diabetes mellitus  without complication     Social History History  Substance Use Topics  . Smoking status: Former Smoker    Quit date: 11/06/2008  . Smokeless tobacco: Never Used  . Alcohol Use: 0.5 oz/week    1 Not specified per week    Family History Family History  Problem Relation Age of Onset  . Heart attack Father     Also mother  . Cancer - Prostate Father   . Heart disease Father     Heart Disease before age 66  . Hyperlipidemia Father   . Hypertension Sister   . Hyperlipidemia Sister   . Cancer Sister   . Stroke Mother   . Heart disease Mother     Heart Disease before age 4  . Hyperlipidemia Mother   . Heart attack Mother   . Diabetes Sister   . Hyperlipidemia Sister   . Hypertension Sister   . Hyperlipidemia Brother   . Cancer Brother     Luekemia  . Hypertension Brother     Surgical History Past Surgical History  Procedure Laterality Date  . Carotid endarterectomy Right 10/25/2001    Subsequent to episode of syncope  . Laminectomy  2000    L5-S1 discectomy and laminectomy; 3 other surgical procedures subsequently performed  . Vaginal hysterectomy    . Laparoscopic cholecystectomy    . Spine surgery  05-23-13    X's 5 surgeries    No Known Allergies  Current Outpatient Prescriptions  Medication Sig Dispense Refill  . aspirin 325 MG  tablet Take 325 mg by mouth daily.    Marland Kitchen atenolol (TENORMIN) 50 MG tablet Take 1/2 tablet TWICE a day    . chlorthalidone (HYGROTON) 25 MG tablet Take 25 mg by mouth daily.    . Fish Oil-Cholecalciferol (FISH OIL-VITAMIN D) 1200-1000 MG-UNIT CAPS Take 3,000 mg by mouth daily.    Marland Kitchen gabapentin (NEURONTIN) 300 MG capsule Take 300 mg by mouth every 8 (eight) hours.    Marland Kitchen levothyroxine (SYNTHROID, LEVOTHROID) 50 MCG tablet Take 50 mcg by mouth daily.    Marland Kitchen lisinopril (PRINIVIL,ZESTRIL) 20 MG tablet Take 20 mg by mouth 2 (two) times daily.    . metFORMIN (GLUCOPHAGE) 1000 MG tablet Take 1,000 mg by mouth 2 (two) times daily.    Marland Kitchen omeprazole  (PRILOSEC) 20 MG capsule Take 20 mg by mouth daily.    . potassium chloride (K-DUR,KLOR-CON) 10 MEQ tablet Take 10 mEq by mouth 2 (two) times daily.    . simvastatin (ZOCOR) 20 MG tablet Take 20 mg by mouth at bedtime.    Marland Kitchen aspirin 325 MG tablet Take 325 mg by mouth daily.    Marland Kitchen atenolol (TENORMIN) 50 MG tablet Take 1 tablet (50 mg total) by mouth daily. 30 tablet 6  . atenolol-chlorthalidone (TENORETIC) 50-25 MG per tablet daily.    . chlorthalidone (HYGROTON) 25 MG tablet Take  One tablet daily    . Cholecalciferol (VITAMIN D-3 PO) Take 2,000 Units by mouth daily.    . cloNIDine (CATAPRES) 0.1 MG tablet Take 1 tablet (0.1 mg total) by mouth 2 (two) times daily. 60 tablet 11  . furosemide (LASIX) 40 MG tablet daily.    Marland Kitchen gabapentin (NEURONTIN) 300 MG capsule every 8 (eight) hours.    Marland Kitchen levothyroxine (SYNTHROID, LEVOTHROID) 50 MCG tablet Take 50 mcg by mouth daily before breakfast.    . lisinopril (PRINIVIL,ZESTRIL) 20 MG tablet Twice daily.    . metoprolol (LOPRESSOR) 50 MG tablet Take 50 mg by mouth 2 (two) times daily.    . Omega-3 Fatty Acids (FISH OIL) 1200 MG CAPS Take by mouth daily.    Marland Kitchen omeprazole (PRILOSEC) 20 MG capsule daily.    . polyethylene glycol powder (GLYCOLAX/MIRALAX) powder continuous as needed.    . potassium chloride (K-DUR) 10 MEQ tablet 10 tablets 2 (two) times daily at 8 am and 10 pm. Two am and pm    . potassium chloride SA (K-DUR,KLOR-CON) 20 MEQ tablet 2 (two) times daily.    . simvastatin (ZOCOR) 20 MG tablet Take 20 mg by mouth every evening.    . verapamil (CALAN-SR) 240 MG CR tablet Take 1 tablet (240 mg total) by mouth at bedtime. 90 tablet 1   No current facility-administered medications for this visit.    Review of Systems : See HPI for pertinent positives and negatives.  Physical Examination  Filed Vitals:   11/06/14 1027 11/06/14 1032  BP: 126/68 158/82  Pulse: 62 63  Resp:  16  Height:  5\' 5"  (1.651 m)  Weight:  216 lb (97.977 kg)  SpO2:  99%    Body mass index is 35.94 kg/(m^2).  General: WDWN obese female in NAD GAIT: antalgic Eyes: PERRLA Pulmonary: CTAB, Negative Rales, Negative rhonchi, & Negative wheezing,  Cardiac: regular Rhythm , Negative Murmurs, lower legs swelling, tender, 1+pitting edema bilaterally.  Vascular:  Vessel Right Left  Radial Palpable Palpable  Carotid audible, without bruit audible, without bruit  Aorta Not palpable N/A  Femoral Palpable Palpable  Popliteal Not palpable Not palpable  PT Not Palpable, but audible with Doppler Not Palpable, but audible with Doppler  DP Not Palpable, but audible with Doppler Not Palpable, but audible with Doppler      Gastrointestinal: soft, nontender, BS WNL, no r/g, no palpated masses; obese abdomen   Musculoskeletal: Negative muscle atrophy/wasting. M/S 5/5 throughout, Extremities without ischemic changes.  Neurologic: A&O X 3; Appropriate Affect ; SENSATION ;normal; MOTOR FUNCTION: no muscle wasting or atrophy Speech is normal CN 2-12 intact, Pain and light touch intact in extremities, Motor exam as listed above     Non-Invasive Vascular Imaging CAROTID DUPLEX 11/06/2014   CEREBROVASCULAR DUPLEX EVALUATION    INDICATION: Carotid artery disease    PREVIOUS INTERVENTION(S): Right carotid endarterectomy 10/25/2001    DUPLEX EXAM: Carotid duplex    RIGHT  LEFT  Peak Systolic Velocities (cm/s) End Diastolic Velocities (cm/s) Plaque LOCATION Peak Systolic Velocities (cm/s) End Diastolic Velocities (cm/s) Plaque  101 18 - CCA PROXIMAL 92 16 -  55 13 - CCA MID 70 17 -  74 17 HT CCA DISTAL 86 25 CP  90 13 - ECA 79 14 -  46 11 HT ICA PROXIMAL 152 44 CP  61 21 - ICA MID 123 35 -  67 22 - ICA DISTAL 92 25 -    1.2 ICA / CCA Ratio (PSV) N/A  Antegrade Vertebral Flow Antegrade  035 Brachial Systolic Pressure (mmHg) 597  Triphasic Brachial Artery Waveforms Triphasic     Plaque Morphology:  HM = Homogeneous, HT = Heterogeneous, CP = Calcific Plaque, SP = Smooth Plaque, IP = Irregular Plaque     ADDITIONAL FINDINGS: Large heterogenous plaque in the right distal common carotid artery and proximal internal carotid artery    IMPRESSION: 1. Patent right carotid endarterectomy site with  less than 40% internal carotid artery stenosis. 2. 40 - 59% left internal carotid artery stenosis.    Compared to the previous exam:  No significant change       Assessment: Martha Torres is a 68 y.o. female who is s/p right CEA in 2002. Carotid Duplex today reveals a patent right carotid endarterectomy site with  less than 40% internal carotid artery stenosis and 40 - 59% left internal carotid artery stenosis. The  ICA stenosis is  Unchanged from previous exam.   Knee high graduated compression hose for mild venous insufficiency, see pt instructions.  Plan: Follow-up in 1 year with Carotid Duplex scan.   I discussed in depth with the patient the nature of atherosclerosis, and emphasized the importance of maximal medical management including strict control of blood pressure, blood glucose, and lipid levels, obtaining regular exercise, and continued cessation of smoking.  The patient is aware that without maximal medical management the underlying atherosclerotic disease process will progress, limiting the benefit of any interventions. The patient was given information about stroke prevention and what symptoms should prompt the patient to seek immediate medical care. Thank you for allowing [place] to participate in this patient's care.  Clemon Chambers, RN, MSN, FNP-C [facility] Office: [phone]  Clinic Physician: Kellie Simmering on call  11/06/2014 10:45 AM

## 2014-11-08 ENCOUNTER — Ambulatory Visit: Payer: Medicare Other | Admitting: Family

## 2014-11-08 ENCOUNTER — Other Ambulatory Visit (HOSPITAL_COMMUNITY): Payer: Medicare Other

## 2014-11-18 ENCOUNTER — Other Ambulatory Visit (HOSPITAL_COMMUNITY): Payer: Medicare Other

## 2014-11-18 ENCOUNTER — Ambulatory Visit: Payer: Medicare Other | Admitting: Family

## 2015-01-29 ENCOUNTER — Other Ambulatory Visit (HOSPITAL_COMMUNITY): Payer: Self-pay | Admitting: Neurosurgery

## 2015-01-29 ENCOUNTER — Ambulatory Visit (HOSPITAL_COMMUNITY)
Admission: RE | Admit: 2015-01-29 | Discharge: 2015-01-29 | Disposition: A | Discharge status: Home / Self Care | Payer: Medicare Other | Source: Ambulatory Visit | Attending: Vascular Surgery | Admitting: Vascular Surgery

## 2015-01-29 DIAGNOSIS — R252 Cramp and spasm: Secondary | ICD-10-CM | POA: Insufficient documentation

## 2015-11-10 ENCOUNTER — Encounter: Payer: Self-pay | Admitting: Family

## 2015-11-12 ENCOUNTER — Ambulatory Visit (HOSPITAL_COMMUNITY)
Admission: RE | Admit: 2015-11-12 | Discharge: 2015-11-12 | Disposition: A | Discharge status: Home / Self Care | Payer: Medicare Other | Source: Ambulatory Visit | Attending: Family | Admitting: Family

## 2015-11-12 ENCOUNTER — Ambulatory Visit (INDEPENDENT_AMBULATORY_CARE_PROVIDER_SITE_OTHER): Payer: Medicare Other | Admitting: Family

## 2015-11-12 ENCOUNTER — Encounter: Payer: Self-pay | Admitting: Family

## 2015-11-12 VITALS — BP 160/73 | HR 56 | Temp 97.3°F | Resp 14 | Ht 65.0 in | Wt 215.0 lb

## 2015-11-12 DIAGNOSIS — R2243 Localized swelling, mass and lump, lower limb, bilateral: Secondary | ICD-10-CM

## 2015-11-12 DIAGNOSIS — Z48812 Encounter for surgical aftercare following surgery on the circulatory system: Secondary | ICD-10-CM | POA: Insufficient documentation

## 2015-11-12 DIAGNOSIS — I6523 Occlusion and stenosis of bilateral carotid arteries: Secondary | ICD-10-CM | POA: Insufficient documentation

## 2015-11-12 DIAGNOSIS — M79662 Pain in left lower leg: Secondary | ICD-10-CM

## 2015-11-12 DIAGNOSIS — Z9889 Other specified postprocedural states: Secondary | ICD-10-CM | POA: Diagnosis not present

## 2015-11-12 DIAGNOSIS — M79661 Pain in right lower leg: Secondary | ICD-10-CM | POA: Diagnosis not present

## 2015-11-12 DIAGNOSIS — Z86718 Personal history of other venous thrombosis and embolism: Secondary | ICD-10-CM

## 2015-11-12 NOTE — Progress Notes (Signed)
Filed Vitals:   11/12/15 0945 11/12/15 0949 11/12/15 1002 11/12/15 1003  BP: 155/70 173/76 164/74 160/73  Pulse: 55 56 57 56  Temp:  97.3 F (36.3 C)    TempSrc:  Oral    Resp:  14    Height:  5\' 5"  (1.651 m)    Weight:  215 lb (97.523 kg)    SpO2:  97%

## 2015-11-12 NOTE — Progress Notes (Signed)
Established Carotid Patient   History of Present Illness  Martha Torres is a 69 y.o. female  patient of Dr. Scot Dock who had a right CEA in 2002 by Dr. Amedeo Plenty. She returns today for follow up. 5 days ago both lower legs started swelling along with pain and itching; pt states her PCP thinks it is from an arthritis medication she started taking 2 days prior to the legs symptoms. Pt states all legs symptoms have improved since she stopped the new medication. Her legs have less swelling after elevation overnight. She denies dyspnea. She has been taking Lasix which has helped the swelling. She saw her PCP yesterday for vomiting, diarrhea, cough, and chest congestion; this has improved since on antibx started yesterday.  Hx DVT left leg about 2012; finished course of coumadin. Recenly had a-fib, then back surgery, May, 2014. Her lower legs started to swell in July 2014 at which time her PCP started her on Lasix which has helped the swelling; her lower legs feel "sore". States she cannot lean forward enough to put on compression hose since her back surgery. No TIA or stroke sx's.  No claudication. Since about August, 2015 her right thigh has intermittent pain. She has a history of 5 back surgeries.  The patient denies amaurosis fugax or monocular blindness. The patient denies facial drooping. Pt. denies hemiplegia. The patient denies receptive or expressive aphasia.   Pt Diabetic: Yes, diagnosed in 2015, on metformin only Pt smoker: No (quit about 2005)  Pt meds include: Statin : Yes Betablocker: Yes ASA: Yes Other anticoagulants/antiplatelets: no   Past Medical History  Diagnosis Date  . Hypertension   . DVT (deep venous thrombosis)   . Hyperlipidemia   . Gastroesophageal reflux     H/o stricture & hiatal hernia  . 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 Degenerative joint disease   .  Tobacco abuse   . Cerebrovascular disease     Right CEA in 2002; duplex in 06/2011: Patent right; 40-59% left ICA  . Chronic low back pain     MRI 2014: Spondylolisthesis of L4-L5 with foraminal narrowing, most prominent on the right and facet arthropathy.  L4-L5 surgery planned for 04/2013 with left-sided pedicle screw fixation.  . Paroxysmal atrial fibrillation 04/2013    04/2013: [facility]; LE Doppler-neg;  . Irregular heart beat   . MRSA (methicillin resistant Staphylococcus aureus) June 2014  . Carotid artery occlusion   . Diabetes mellitus without complication     Social History Social History  Substance Use Topics  . Smoking status: Former Smoker    Quit date: 11/06/2008  . Smokeless tobacco: Never Used  . Alcohol Use: 0.5 oz/week    1 Standard drinks or equivalent per week    Family History Family History  Problem Relation Age of Onset  . Heart attack Father     Also mother  . Cancer - Prostate Father   . Heart disease Father     Heart Disease before age 62  . Hyperlipidemia Father   . Hypertension Sister   . Hyperlipidemia Sister   . Cancer Sister   . Stroke Mother   . Heart disease Mother     Heart Disease before age 53  . Hyperlipidemia Mother   . Heart attack Mother   . Diabetes Sister   . Hyperlipidemia Sister   . Hypertension Sister   . Hyperlipidemia Brother   .  Cancer Brother     Luekemia  . Hypertension Brother     Surgical History Past Surgical History  Procedure Laterality Date  . Carotid endarterectomy Right 10/25/2001    Subsequent to episode of syncope  . Laminectomy  2000    L5-S1 discectomy and laminectomy; 3 other surgical procedures subsequently performed  . Vaginal hysterectomy    . Laparoscopic cholecystectomy    . Spine surgery  05-23-13    X's 5 surgeries    No Known Allergies  Current Outpatient Prescriptions  Medication Sig Dispense Refill  . aspirin 325 MG tablet Take 325 mg by mouth daily.    Marland Kitchen aspirin 325  MG tablet Take 325 mg by mouth daily.    Marland Kitchen atenolol (TENORMIN) 50 MG tablet Take 1 tablet (50 mg total) by mouth daily. 30 tablet 6  . atenolol (TENORMIN) 50 MG tablet Take 1/2 tablet TWICE a day    . atenolol-chlorthalidone (TENORETIC) 50-25 MG per tablet daily.    . chlorthalidone (HYGROTON) 25 MG tablet Take  One tablet daily    . chlorthalidone (HYGROTON) 25 MG tablet Take 25 mg by mouth daily.    . Cholecalciferol (VITAMIN D-3 PO) Take 2,000 Units by mouth daily.    . cloNIDine (CATAPRES) 0.1 MG tablet Take 1 tablet (0.1 mg total) by mouth 2 (two) times daily. 60 tablet 11  . Fish Oil-Cholecalciferol (FISH OIL-VITAMIN D) 1200-1000 MG-UNIT CAPS Take 3,000 mg by mouth daily.    . furosemide (LASIX) 40 MG tablet daily.    Marland Kitchen gabapentin (NEURONTIN) 300 MG capsule every 8 (eight) hours.    . gabapentin (NEURONTIN) 300 MG capsule Take 300 mg by mouth every 8 (eight) hours.    Marland Kitchen levothyroxine (SYNTHROID, LEVOTHROID) 50 MCG tablet Take 50 mcg by mouth daily before breakfast.    . levothyroxine (SYNTHROID, LEVOTHROID) 50 MCG tablet Take 50 mcg by mouth daily.    Marland Kitchen lisinopril (PRINIVIL,ZESTRIL) 20 MG tablet Twice daily.    Marland Kitchen lisinopril (PRINIVIL,ZESTRIL) 20 MG tablet Take 20 mg by mouth 2 (two) times daily.    . metFORMIN (GLUCOPHAGE) 1000 MG tablet Take 1,000 mg by mouth 2 (two) times daily.    . metoprolol (LOPRESSOR) 50 MG tablet Take 50 mg by mouth 2 (two) times daily.    . Omega-3 Fatty Acids (FISH OIL) 1200 MG CAPS Take by mouth daily.    Marland Kitchen omeprazole (PRILOSEC) 20 MG capsule daily.    Marland Kitchen omeprazole (PRILOSEC) 20 MG capsule Take 20 mg by mouth daily.    . polyethylene glycol powder (GLYCOLAX/MIRALAX) powder continuous as needed.    . potassium chloride (K-DUR) 10 MEQ tablet 10 tablets 2 (two) times daily at 8 am and 10 pm. Two am and pm    . potassium chloride (K-DUR,KLOR-CON) 10 MEQ tablet Take 10 mEq by mouth 2 (two) times daily.    . potassium chloride SA (K-DUR,KLOR-CON) 20 MEQ tablet 2  (two) times daily.    . simvastatin (ZOCOR) 20 MG tablet Take 20 mg by mouth every evening.    . simvastatin (ZOCOR) 20 MG tablet Take 20 mg by mouth at bedtime.    . verapamil (CALAN-SR) 240 MG CR tablet Take 1 tablet (240 mg total) by mouth at bedtime. 90 tablet 1   No current facility-administered medications for this visit.    Review of Systems : See HPI for pertinent positives and negatives.  Physical Examination  Filed Vitals:   11/12/15 0945 11/12/15 0949 11/12/15 1002 11/12/15 1003  BP:  155/70 173/76 164/74 160/73  Pulse: 55 56 57 56  Temp:  97.3 F (36.3 C)    TempSrc:  Oral    Resp:  14    Height:  5\' 5"  (1.651 m)    Weight:  215 lb (97.523 kg)    SpO2:  97%     Body mass index is 35.78 kg/(m^2).   General: WDWN obese female in NAD GAIT: antalgic Eyes: PERRLA Pulmonary: CTAB, no rales, rhonchi, or wheezing,  Cardiac: regular rhythm, no murmur, lower legs swelling, tender, 1+pitting edema bilaterally.  Vascular:  Vessel Right Left  Radial Palpable Palpable  Carotid audible, without bruit audible, without bruit  Aorta Not palpable N/A  Femoral Palpable Palpable  Popliteal Not palpable Not palpable  PT Not Palpable, but audible with Doppler Not Palpable, but audible with Doppler  DP Not Palpable, but audible with Doppler Not Palpable, but audible with Doppler      Gastrointestinal: soft, nontender, BS WNL, no r/g, no palpated masses; obese abdomen   Musculoskeletal: No atrophy/wasting. M/S 5/5 throughout, Extremities without ischemic changes. Left lower leg with mild erythema with 1-2+ non pitting edema, tenderness to palpation at both calves, worse in the left calf. Trace non pitting edema with no erythema in right calf.  Neurologic: A&O X 3; Appropriate Affect,  MOTOR FUNCTION: no muscle wasting or atrophy Speech is normal CN 2-12 intact, Pain and  light touch intact in extremities, Motor exam as listed above            Non-Invasive Vascular Imaging CAROTID DUPLEX 11/12/2015   CEREBROVASCULAR DUPLEX EVALUATION    INDICATION: Carotid artery disease     PREVIOUS INTERVENTION(S): Right carotid endarterectomy 10/25/2001    DUPLEX EXAM:     RIGHT  LEFT  Peak Systolic Velocities (cm/s) End Diastolic Velocities (cm/s) Plaque LOCATION Peak Systolic Velocities (cm/s) End Diastolic Velocities (cm/s) Plaque  138 16  CCA PROXIMAL 75 17   99 13  CCA MID 72 17   65 14 HT CCA DISTAL 75 20 CP  80 6  ECA 80 11   57 17 HT ICA PROXIMAL 136 34 CP  89 20  ICA MID 135 34   66 24  ICA DISTAL 110 23     NA ICA / CCA Ratio (PSV) 1.88  Antegrade  Vertebral Flow Antegrade    Brachial Systolic Pressure (mmHg)   Multiphasic (Subclavian artery) Brachial Artery Waveforms Multiphasic (Subclavian artery)    Plaque Morphology:  HM = Homogeneous, HT = Heterogeneous, CP = Calcific Plaque, SP = Smooth Plaque, IP = Irregular Plaque     ADDITIONAL FINDINGS: Large heterogeneous plaque in the right distal common carotid and proximal internal carotid artery.    IMPRESSION: Patent right carotid endarterectomy site with <40% stenosis. Left internal carotid artery velocities suggest a <40% stenosis.     Compared to the previous exam:  No significant change in comparison to the last exam on 11/06/2014.      Assessment: CIEARA FISK is a 69 y.o. female who is s/p right CEA in 2002 by Dr. Amedeo Plenty. She has no history of stroke or TIA.  Today's carotid duplex suggests <40% stenosis of the right ICA which is the CEA site, and <40% stenosis of the left ICA. No significant change in comparison to the last exam on 11/06/2014.   Dr. Glenna Fellows requested the ABI's that were performed on 01/29/15. ABI's and TBI's were normal with bi and triphasic waveforms.  5 days ago both lower legs  started swelling along with pain and itching; pt states her PCP thinks it is from  an arthritis medication she started taking 2 days prior to the legs symptoms. Pt states all legs symptoms have improved since she stopped the new medication. Her legs have less swelling after elevation overnight which indicates some degree of venous reflux. She denies dyspnea. She has been taking Lasix which has helped the swelling.  She has a history of left leg DVT about 2012; finished course of coumadin.  Pt states she cannot stay to try to work in bilateral DVT venous duplex, states she has to go to work, but states she can return next Wednesday, 11/19/15. I advised pt to call 911 should she experience shortness of breath, to let the ED personnel know that she has a history of left leg DVT and lately had bilateral lower legs swelling and pain. Lower suspicion of DVT since she stopped a possible allergen in the form of a medication for arthritis that was recently started then stopped.  Face to face time with patient was 25 minutes. Over 50% of this time was spent on counseling and coordination of care.   Plan: Follow-up in 1 year with Carotid Duplex scan, and ASAP with bilateral LE DVT venous duplex. Will call pt with results of DVT duplex, she does not need to see me (I will be out of town this week). [phone], pt home phone, can leave message.   I discussed in depth with the patient the nature of atherosclerosis, and emphasized the importance of maximal medical management including strict control of blood pressure, blood glucose, and lipid levels, obtaining regular exercise, and continued cessation of smoking.  The patient is aware that without maximal medical management the underlying atherosclerotic disease process will progress, limiting the benefit of any interventions. The patient was given information about stroke prevention and what symptoms should prompt the patient to seek immediate medical care. Thank you for allowing [place] to participate in this patient's care.  Clemon Chambers,  RN, MSN, FNP-C [facility] [place] Office: [phone]  Clinic Physician: Scot Dock  11/12/2015 8:59 AM

## 2015-11-12 NOTE — Patient Instructions (Signed)
Stroke Prevention Some medical conditions and behaviors are associated with an increased chance of having a stroke. You may prevent a stroke by making healthy choices and managing medical conditions. HOW CAN I REDUCE MY RISK OF HAVING A STROKE?   Stay physically active. Get at least 30 minutes of activity on most or all days.  Do not smoke. It may also be helpful to avoid exposure to secondhand smoke.  Limit alcohol use. Moderate alcohol use is considered to be:  No more than 2 drinks per day for men.  No more than 1 drink per day for nonpregnant women.  Eat healthy foods. This involves:  Eating 5 or more servings of fruits and vegetables a day.  Making dietary changes that address high blood pressure (hypertension), high cholesterol, diabetes, or obesity.  Manage your cholesterol levels.  Making food choices that are high in fiber and low in saturated fat, trans fat, and cholesterol may control cholesterol levels.  Take any prescribed medicines to control cholesterol as directed by your health care provider.  Manage your diabetes.  Controlling your carbohydrate and sugar intake is recommended to manage diabetes.  Take any prescribed medicines to control diabetes as directed by your health care provider.  Control your hypertension.  Making food choices that are low in salt (sodium), saturated fat, trans fat, and cholesterol is recommended to manage hypertension.  Ask your health care provider if you need treatment to lower your blood pressure. Take any prescribed medicines to control hypertension as directed by your health care provider.  If you are 18-39 years of age, have your blood pressure checked every 3-5 years. If you are 40 years of age or older, have your blood pressure checked every year.  Maintain a healthy weight.  Reducing calorie intake and making food choices that are low in sodium, saturated fat, trans fat, and cholesterol are recommended to manage  weight.  Stop drug abuse.  Avoid taking birth control pills.  Talk to your health care provider about the risks of taking birth control pills if you are over 35 years old, smoke, get migraines, or have ever had a blood clot.  Get evaluated for sleep disorders (sleep apnea).  Talk to your health care provider about getting a sleep evaluation if you snore a lot or have excessive sleepiness.  Take medicines only as directed by your health care provider.  For some people, aspirin or blood thinners (anticoagulants) are helpful in reducing the risk of forming abnormal blood clots that can lead to stroke. If you have the irregular heart rhythm of atrial fibrillation, you should be on a blood thinner unless there is a good reason you cannot take them.  Understand all your medicine instructions.  Make sure that other conditions (such as anemia or atherosclerosis) are addressed. SEEK IMMEDIATE MEDICAL CARE IF:   You have sudden weakness or numbness of the face, arm, or leg, especially on one side of the body.  Your face or eyelid droops to one side.  You have sudden confusion.  You have trouble speaking (aphasia) or understanding.  You have sudden trouble seeing in one or both eyes.  You have sudden trouble walking.  You have dizziness.  You have a loss of balance or coordination.  You have a sudden, severe headache with no known cause.  You have new chest pain or an irregular heartbeat. Any of these symptoms may represent a serious problem that is an emergency. Do not wait to see if the symptoms will   go away. Get medical help at once. Call your local emergency services (911 in [place] Do not drive yourself to the hospital.   This information is not intended to replace advice given to you by your health care provider. Make sure you discuss any questions you have with your health care provider.   Document Released: 01/20/2005 Document Revised: 01/03/2015 Document Reviewed:  06/15/2013 Elsevier Interactive Patient Education 2016 Elsevier Inc.  

## 2015-11-19 ENCOUNTER — Ambulatory Visit (HOSPITAL_COMMUNITY)
Admission: RE | Admit: 2015-11-19 | Discharge: 2015-11-19 | Disposition: A | Discharge status: Home / Self Care | Payer: Medicare Other | Source: Ambulatory Visit | Attending: Surgery | Admitting: Surgery

## 2015-11-19 DIAGNOSIS — E785 Hyperlipidemia, unspecified: Secondary | ICD-10-CM | POA: Diagnosis not present

## 2015-11-19 DIAGNOSIS — I10 Essential (primary) hypertension: Secondary | ICD-10-CM | POA: Insufficient documentation

## 2015-11-19 DIAGNOSIS — M79661 Pain in right lower leg: Secondary | ICD-10-CM | POA: Diagnosis not present

## 2015-11-19 DIAGNOSIS — Z48812 Encounter for surgical aftercare following surgery on the circulatory system: Secondary | ICD-10-CM | POA: Diagnosis not present

## 2015-11-19 DIAGNOSIS — Z9889 Other specified postprocedural states: Secondary | ICD-10-CM | POA: Diagnosis not present

## 2015-11-19 DIAGNOSIS — M79662 Pain in left lower leg: Secondary | ICD-10-CM | POA: Insufficient documentation

## 2015-11-19 DIAGNOSIS — Z86718 Personal history of other venous thrombosis and embolism: Secondary | ICD-10-CM | POA: Diagnosis not present

## 2015-11-19 DIAGNOSIS — E119 Type 2 diabetes mellitus without complications: Secondary | ICD-10-CM | POA: Insufficient documentation

## 2015-11-19 DIAGNOSIS — R2243 Localized swelling, mass and lump, lower limb, bilateral: Secondary | ICD-10-CM | POA: Insufficient documentation

## 2015-11-24 ENCOUNTER — Telehealth: Payer: Self-pay | Admitting: Family

## 2015-11-24 NOTE — Telephone Encounter (Signed)
I spoke with Martha Torres re her venous duplex results from 11/19/15 which demonstrated no DVT. There was evidence of deep vein insufficiency of the left leg. Conservative treatment of venous insufficiency is elevation of feet above the heart when not walking and compression hose during the day, this was discussed with the pt. She states that she is able to donn and is wearing knee high compression hose which has helped the swelling. She states that she has pain across the anterior aspect of her ankle. I advised her that she may have this evaluated by a podiatrist, and that this is not likely related to an issue with veins or arteries.

## 2016-09-28 ENCOUNTER — Ambulatory Visit: Payer: Self-pay | Admitting: Cardiovascular Disease

## 2016-10-29 ENCOUNTER — Encounter: Payer: Self-pay | Admitting: Cardiology

## 2016-10-29 NOTE — Progress Notes (Signed)
Cardiology Office Note  Date: 11/01/2016   Martha Torres, DOB May 17, 1946, MRN [id number]  PCP: Wende Neighbors, MD  Consulting Cardiologist: Rozann Lesches, MD   Chief Complaint  Patient presents with  . PAF    History of Present Illness: Martha Torres is a 70 y.o. female referred for cardiology consultation by Dr. Nevada Crane. She is a former patient of the [facility] cardiology practice, last saw Dr. Hamilton Capri back in February. She has a history of paroxysmal atrial fibrillation, has been on Xarelto for stroke prophylaxis with a CHADSVASC score of 5. She reports a rare feeling of brief palpitations, nothing prolonged. No exertional chest pain or limiting shortness of breath. She has chronic leg pain.  Recent lab work per Dr. Nevada Crane showed total cholesterol 168, triglycerides 160, LDL 78, TSH 1.12, hemoglobin A1c 6.3. She reports compliance with her medications including Zocor.  I reviewed her ECG today which shows normal sinus rhythm.  Blood pressure is well controlled today. She is on chlorthalidone and lisinopril.  She follows with [facility], history of right CEA in 2002 by Dr. Amedeo Plenty. She is due for follow-up carotid Dopplers this month.  Past Medical History:  Diagnosis Date  . Carotid artery occlusion    Right CEA in 2002  . Chest pain 2006   Stress nuclear-anteroapical and basilar inferior reversible defects; cath-normal coronary arteries and EF  . Chronic low back pain    MRI 2014: Spondylolisthesis of L4-L5 with foraminal narrowing, most prominent on the right and facet arthropathy.  L4-L5 surgery planned for 04/2013 with left-sided pedicle screw fixation.  . Degenerative joint disease   . DVT (deep venous thrombosis) ([place])   . Essential hypertension   . Gastroesophageal reflux    H/o stricture & hiatal hernia  . Hyperlipidemia   . MRSA (methicillin resistant Staphylococcus aureus) June 2014  . Paroxysmal atrial fibrillation ([place]) 04/2013  . Type 2 diabetes mellitus ([place])      Past Surgical History:  Procedure Laterality Date  . CAROTID ENDARTERECTOMY Right 10/25/2001   Subsequent to episode of syncope  . LAMINECTOMY  2000   L5-S1 discectomy and laminectomy; 3 other surgical procedures subsequently performed  . LAPAROSCOPIC CHOLECYSTECTOMY    . SPINE SURGERY  05-23-13   X's 5 surgeries  . VAGINAL HYSTERECTOMY      Current Outpatient Prescriptions  Medication Sig Dispense Refill  . atenolol (TENORMIN) 50 MG tablet Take 25 mg by mouth 2 (two) times daily.    . chlorthalidone (HYGROTON) 25 MG tablet Take  One tablet daily    . Cholecalciferol (VITAMIN D-3 PO) Take 2,000 Units by mouth daily.    . Fish Oil-Cholecalciferol (FISH OIL-VITAMIN D) 1200-1000 MG-UNIT CAPS Take 3,000 mg by mouth daily.    Marland Kitchen gabapentin (NEURONTIN) 300 MG capsule Take 300 mg by mouth every 8 (eight) hours.    Marland Kitchen glipiZIDE (GLUCOTROL XL) 5 MG 24 hr tablet Take 5 mg by mouth daily.    Marland Kitchen levothyroxine (SYNTHROID, LEVOTHROID) 50 MCG tablet Take 50 mcg by mouth daily.    Marland Kitchen lisinopril (PRINIVIL,ZESTRIL) 20 MG tablet Take 20 mg by mouth 2 (two) times daily.    Marland Kitchen omeprazole (PRILOSEC) 20 MG capsule Take 20 mg by mouth daily.    . polyethylene glycol powder (GLYCOLAX/MIRALAX) powder continuous as needed.    . potassium chloride SA (K-DUR,KLOR-CON) 20 MEQ tablet 2 (two) times daily.    . rivaroxaban (XARELTO) 20 MG TABS tablet Take 1 tablet (20 mg total) by mouth daily  with supper. 28 tablet 0  . simvastatin (ZOCOR) 20 MG tablet Take 20 mg by mouth at bedtime.     No current facility-administered medications for this visit.    Allergies:  Patient has no known allergies.   Social History: The patient  reports that she quit smoking about 7 years ago. She has never used smokeless tobacco. She reports that she drinks about 0.5 oz of alcohol per week . She reports that she uses drugs.   Family History: The patient's family history includes Cancer in her brother and sister; Cancer - Prostate in  her father; Diabetes in her sister; Heart attack in her father and mother; Heart disease in her father and mother; Hyperlipidemia in her brother, father, mother, sister, and sister; Hypertension in her brother, sister, and sister; Stroke in her mother.   ROS:  Please see the history of present illness. Otherwise, complete review of systems is positive for lower back and leg pain.  All other systems are reviewed and negative.   Physical Exam: VS:  BP 138/78   Pulse 64   Ht 5\' 5"  (1.651 m)   Wt 224 lb (101.6 kg)   SpO2 94%   BMI 37.28 kg/m , BMI Body mass index is 37.28 kg/m.  Wt Readings from Last 3 Encounters:  11/01/16 224 lb (101.6 kg)  11/12/15 215 lb (97.5 kg)  11/06/14 216 lb (98 kg)    General: Obese woman, appears comfortable at rest. HEENT: Conjunctiva and lids normal, oropharynx clear. Neck: Supple, no elevated JVP or carotid bruits, no thyromegaly. Lungs: Clear to auscultation, nonlabored breathing at rest. Cardiac: Regular rate and rhythm, no S3. soft systolic murmur, no pericardial rub. Abdomen: Soft, nontender, bowel sounds present, no guarding or rebound. Extremities: Trace ankle edema, distal pulses 1-2+. Skin: Warm and dry. Musculoskeletal: No kyphosis. Neuropsychiatric: Alert and oriented x3, affect grossly appropriate.  ECG: I personally reviewed the tracing from 04/24/2013 which showed probable atrial fibrillation resolving to sinus rhythm.  Other Studies Reviewed Today:  Echocardiogram 02/01/2014 ([facility]): Mild LVH with LVEF 123456, grade 1 diastolic dysfunction, normal left atrial chamber size, trace mitral regurgitation, mild aortic regurgitation, mildly sclerotic aortic valve, trace tricuspid regurgitation with normal RVSP, no pericardial effusion.  Carotid Dopplers 11/06/2014: Patent right CEA site with less than [id number] stenosis, LICA with 123456 stenosis.  Assessment and Plan:  1. Paroxysmal atrial fibrillation with CHADSVASC score of 5. She is in sinus  rhythm by ECG today reports only rare palpitations. Plan to continue Xarelto for stroke prophylaxis, also on atenolol.  2. Carotid artery disease status post right CEA in 2002. Keep follow-up with [facility].  3. Essential hypertension, blood pressure is well controlled today. Additional medications include lisinopril and chlorthalidone.  4. Hyperlipidemia, on Zocor. Recent LDL 78.  Current medicines were reviewed with the patient today.   Orders Placed This Encounter  Procedures  . EKG 12-Lead    Disposition: Follow-up in 6 months.  Signed, Satira Sark, MD, [facility] 11/01/2016 8:59 AM    [facility] [place], [place],  [address] Phone: [phone]; Fax: [phone]

## 2016-11-01 ENCOUNTER — Ambulatory Visit (INDEPENDENT_AMBULATORY_CARE_PROVIDER_SITE_OTHER): Payer: Medicare Other | Admitting: Cardiology

## 2016-11-01 ENCOUNTER — Encounter: Payer: Self-pay | Admitting: Cardiology

## 2016-11-01 VITALS — BP 138/78 | HR 64 | Ht 65.0 in | Wt 224.0 lb

## 2016-11-01 DIAGNOSIS — I10 Essential (primary) hypertension: Secondary | ICD-10-CM | POA: Diagnosis not present

## 2016-11-01 DIAGNOSIS — E782 Mixed hyperlipidemia: Secondary | ICD-10-CM

## 2016-11-01 DIAGNOSIS — I6523 Occlusion and stenosis of bilateral carotid arteries: Secondary | ICD-10-CM | POA: Diagnosis not present

## 2016-11-01 DIAGNOSIS — I480 Paroxysmal atrial fibrillation: Secondary | ICD-10-CM

## 2016-11-01 MED ORDER — RIVAROXABAN 20 MG PO TABS: 20.0000 mg | ORAL_TABLET | Freq: Every day | ORAL | 0 refills | Status: DC

## 2016-11-01 NOTE — Patient Instructions (Signed)
Your physician wants you to follow-up in: [place] DR. Domenic Polite You will receive a reminder letter in the mail two months in advance. If you don't receive a letter, please call our office to schedule the follow-up appointment.   Your physician recommends that you continue on your current medications as directed. Please refer to the Current Medication list given to you today.  WE HAVE GIVEN YOU XARELTO SAMPLES  Thank you for choosing [facility]!!

## 2016-11-03 ENCOUNTER — Encounter: Payer: Self-pay | Admitting: Family

## 2016-11-05 ENCOUNTER — Ambulatory Visit (INDEPENDENT_AMBULATORY_CARE_PROVIDER_SITE_OTHER): Payer: Medicare Other | Admitting: Family

## 2016-11-05 ENCOUNTER — Ambulatory Visit (HOSPITAL_COMMUNITY)
Admission: RE | Admit: 2016-11-05 | Discharge: 2016-11-05 | Disposition: A | Discharge status: Home / Self Care | Payer: Medicare Other | Source: Ambulatory Visit | Attending: Family | Admitting: Family

## 2016-11-05 ENCOUNTER — Encounter: Payer: Self-pay | Admitting: Family

## 2016-11-05 VITALS — BP 139/68 | HR 63 | Temp 97.0°F | Ht 65.0 in | Wt 223.0 lb

## 2016-11-05 DIAGNOSIS — Z9889 Other specified postprocedural states: Secondary | ICD-10-CM

## 2016-11-05 DIAGNOSIS — Z87891 Personal history of nicotine dependence: Secondary | ICD-10-CM

## 2016-11-05 DIAGNOSIS — Z86718 Personal history of other venous thrombosis and embolism: Secondary | ICD-10-CM | POA: Diagnosis not present

## 2016-11-05 DIAGNOSIS — I6523 Occlusion and stenosis of bilateral carotid arteries: Secondary | ICD-10-CM | POA: Diagnosis not present

## 2016-11-05 DIAGNOSIS — M79662 Pain in left lower leg: Secondary | ICD-10-CM | POA: Diagnosis present

## 2016-11-05 DIAGNOSIS — Z48812 Encounter for surgical aftercare following surgery on the circulatory system: Secondary | ICD-10-CM | POA: Diagnosis not present

## 2016-11-05 DIAGNOSIS — M79661 Pain in right lower leg: Secondary | ICD-10-CM

## 2016-11-05 DIAGNOSIS — R2243 Localized swelling, mass and lump, lower limb, bilateral: Secondary | ICD-10-CM

## 2016-11-05 LAB — VAS US CAROTID
LEFT ECA DIAS: -15 cm/s
LEFT VERTEBRAL DIAS: 12 cm/s
LICAPDIAS: -45 cm/s
Left CCA dist dias: 21 cm/s
Left CCA dist sys: 93 cm/s
Left CCA prox dias: -17 cm/s
Left CCA prox sys: -86 cm/s
Left ICA dist dias: -26 cm/s
Left ICA dist sys: -85 cm/s
Left ICA prox sys: -169 cm/s
RCCAPDIAS: -20 cm/s
RIGHT CCA MID DIAS: 14 cm/s
RIGHT ECA DIAS: -5 cm/s
Right CCA prox sys: -144 cm/s
Right cca dist sys: -71 cm/s

## 2016-11-05 NOTE — Progress Notes (Signed)
Chief Complaint: Follow up Extracranial Carotid Artery Stenosis   History of Present Illness  LETHEA BRANIGAN is a 70 y.o. female patient of Dr. Scot Dock who had a right CEA in 2002 by Dr. Amedeo Plenty. She returns today for follow up.  Hx of DVT left leg about 2012; finished course of coumadin.  Her lower legs started to swell in July 2014 at which time her PCP started her on Lasix which has helped the swelling; her lower legs feel "sore". States she cannot lean forward enough to put on compression hose since her back surgery.  She denies any history of TIA or stroke sx's. Specifically she denies a history of amaurosis fugax or monocular blindness, unilateral facial drooping, hemiplegia, or receptive or expressive aphasia.  She does not seem to indicate claudication symptoms with walking.  Since about August, 2015 her right thigh has intermittent pain. She has a history of 5 back surgeries.   She was hospitalized at [facility] in December 2016 with hypokalemia and recurrence of atrial fib; she did not need a cardioversion, states medication was used to return her to SR.   Pt Diabetic: Yes, diagnosed in 2015, states last A1C was 6.3 Pt smoker: No (quit about 2005)  Pt meds include: Statin : Yes Betablocker: Yes ASA: no Other anticoagulants/antiplatelets: Xarelto started when she had atrial fib in December 2016  Past Medical History:  Diagnosis Date  . Carotid artery occlusion    Right CEA in 2002  . Chest pain 2006   Stress nuclear-anteroapical and basilar inferior reversible defects; cath-normal coronary arteries and EF  . Chronic low back pain    MRI 2014: Spondylolisthesis of L4-L5 with foraminal narrowing, most prominent on the right and facet arthropathy.  L4-L5 surgery planned for 04/2013 with left-sided pedicle screw fixation.  . Degenerative joint disease   . DVT (deep venous thrombosis) ([place])   . Essential hypertension   . Gastroesophageal reflux    H/o  stricture & hiatal hernia  . Hyperlipidemia   . MRSA (methicillin resistant Staphylococcus aureus) June 2014  . Paroxysmal atrial fibrillation ([place]) 04/2013  . Type 2 diabetes mellitus ([place])     Social History Social History  Substance Use Topics  . Smoking status: Former Smoker    Quit date: 11/06/2008  . Smokeless tobacco: Never Used  . Alcohol use 0.5 oz/week    1 Standard drinks or equivalent per week    Family History Family History  Problem Relation Age of Onset  . Heart attack Father     Also mother  . Cancer - Prostate Father   . Heart disease Father     Heart Disease before age 21  . Hyperlipidemia Father   . Hypertension Sister   . Hyperlipidemia Sister   . Cancer Sister   . Stroke Mother   . Heart disease Mother     Heart Disease before age 46  . Hyperlipidemia Mother   . Heart attack Mother   . Diabetes Sister   . Hyperlipidemia Sister   . Hypertension Sister   . Hyperlipidemia Brother   . Cancer Brother     Luekemia  . Hypertension Brother     Surgical History Past Surgical History:  Procedure Laterality Date  . CAROTID ENDARTERECTOMY Right 10/25/2001   Subsequent to episode of syncope  . LAMINECTOMY  2000   L5-S1 discectomy and laminectomy; 3 other surgical procedures subsequently performed  . LAPAROSCOPIC CHOLECYSTECTOMY    . SPINE SURGERY  05-23-13   X's  5 surgeries  . VAGINAL HYSTERECTOMY      No Known Allergies  Current Outpatient Prescriptions  Medication Sig Dispense Refill  . Acetaminophen (TYLENOL ARTHRITIS PAIN PO) Take by mouth 2 (two) times daily.    Marland Kitchen ALPRAZolam (XANAX) 0.5 MG tablet Take 0.5 mg by mouth at bedtime as needed for anxiety.    Marland Kitchen atenolol (TENORMIN) 50 MG tablet Take 25 mg by mouth 2 (two) times daily.    . chlorthalidone (HYGROTON) 25 MG tablet Take  One tablet daily    . gabapentin (NEURONTIN) 300 MG capsule Take 300 mg by mouth every 8 (eight) hours.    Marland Kitchen glipiZIDE (GLUCOTROL XL) 5 MG 24 hr tablet Take 5 mg by  mouth daily.    Marland Kitchen levothyroxine (SYNTHROID, LEVOTHROID) 50 MCG tablet Take 50 mcg by mouth daily.    Marland Kitchen lisinopril (PRINIVIL,ZESTRIL) 20 MG tablet Take 20 mg by mouth 2 (two) times daily.    Marland Kitchen omeprazole (PRILOSEC) 20 MG capsule Take 20 mg by mouth daily.    . potassium chloride SA (K-DUR,KLOR-CON) 20 MEQ tablet 2 (two) times daily.    . rivaroxaban (XARELTO) 20 MG TABS tablet Take 1 tablet (20 mg total) by mouth daily with supper. 28 tablet 0  . simvastatin (ZOCOR) 20 MG tablet Take 20 mg by mouth at bedtime.     No current facility-administered medications for this visit.     Review of Systems : See HPI for pertinent positives and negatives.  Physical Examination  Vitals:   11/05/16 1013 11/05/16 1021  BP: 133/64 139/68  Pulse: 60 63  Temp: 97 F (36.1 C)   SpO2: 99%   Weight: 223 lb (101.2 kg)   Height: 5\' 5"  (1.651 m)    Body mass index is 37.11 kg/m.  General: WDWN obese female in NAD GAIT: antalgic Eyes: PERRLA Pulmonary: Respirations are non labored, CTAB, no rales, rhonchi, or wheezing,  Cardiac: regular rhythm, no detected murmur.  Vascular:  Vessel Right Left  Radial Palpable Palpable  Carotid audible, without bruit audible, without bruit  Aorta Not palpable N/A  Femoral Palpable Palpable  Popliteal Not palpable Not palpable  PT  Palpable  Palpable  DP Not Palpable, but audible with Doppler Not Palpable, but audible with Doppler      Gastrointestinal: soft, nontender, BS WNL, no r/g, no palpated masses; obese abdomen   Musculoskeletal: No atrophy/wasting. M/S 5/5 throughout, Extremities without ischemic changes. No peripheral edema.  Neurologic: A&O X 3; Appropriate Affect,  MOTOR FUNCTION: no muscle wasting or atrophy Speech is normal CN 2-12 intact, Pain and light touch intact in extremities, Motor exam as listed  above.     Assessment: RHODORA KAUTZ is a 70 y.o. female who is s/p right CEA in 2002. She has no history of stroke or TIA.  She has gained weight since her last visit.  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 Her walking is limited by low back pain, she does not have leg pain or claudication sx's.   DATA Today's carotid duplex suggests no re stenosis of the right ICA which is the CEA site, and 40-59% stenosis of the left ICA. Bilateral vertebral artery flow is antegrade. Bilateral subclavian artery waveforms are normal.  Stable in the right CA, increased stenosis of the left ICA compared to the last exam on 11/12/15.   November 2016 bilateral LE venous duplex was negative for DVT.   Dr. Glenna Fellows requested the ABI's that  were performed on 01/29/15. ABI's and TBI's were normal with bi and triphasic waveforms.    Plan: Follow-up in 1 year with Carotid Duplex scan.   I discussed in depth with the patient the nature of atherosclerosis, and emphasized the importance of maximal medical management including strict control of blood pressure, blood glucose, and lipid levels, obtaining regular exercise, and continued cessation of smoking.  The patient is aware that without maximal medical management the underlying atherosclerotic disease process will progress, limiting the benefit of any interventions. The patient was given information about stroke prevention and what symptoms should prompt the patient to seek immediate medical care. Thank you for allowing [place] to participate in this patient's care.  Clemon Chambers, RN, MSN, FNP-C [facility] Office: [phone]  Clinic Physician: Donzetta Matters  11/05/16 10:47 AM

## 2016-11-05 NOTE — Patient Instructions (Signed)
Stroke Prevention Some medical conditions and behaviors are associated with an increased chance of having a stroke. You may prevent a stroke by making healthy choices and managing medical conditions. HOW CAN I REDUCE MY RISK OF HAVING A STROKE?   Stay physically active. Get at least 30 minutes of activity on most or all days.  Do not smoke. It may also be helpful to avoid exposure to secondhand smoke.  Limit alcohol use. Moderate alcohol use is considered to be:  No more than 2 drinks per day for men.  No more than 1 drink per day for nonpregnant women.  Eat healthy foods. This involves:  Eating 5 or more servings of fruits and vegetables a day.  Making dietary changes that address high blood pressure (hypertension), high cholesterol, diabetes, or obesity.  Manage your cholesterol levels.  Making food choices that are high in fiber and low in saturated fat, trans fat, and cholesterol may control cholesterol levels.  Take any prescribed medicines to control cholesterol as directed by your health care provider.  Manage your diabetes.  Controlling your carbohydrate and sugar intake is recommended to manage diabetes.  Take any prescribed medicines to control diabetes as directed by your health care provider.  Control your hypertension.  Making food choices that are low in salt (sodium), saturated fat, trans fat, and cholesterol is recommended to manage hypertension.  Ask your health care provider if you need treatment to lower your blood pressure. Take any prescribed medicines to control hypertension as directed by your health care provider.  If you are 18-39 years of age, have your blood pressure checked every 3-5 years. If you are 40 years of age or older, have your blood pressure checked every year.  Maintain a healthy weight.  Reducing calorie intake and making food choices that are low in sodium, saturated fat, trans fat, and cholesterol are recommended to manage  weight.  Stop drug abuse.  Avoid taking birth control pills.  Talk to your health care provider about the risks of taking birth control pills if you are over 35 years old, smoke, get migraines, or have ever had a blood clot.  Get evaluated for sleep disorders (sleep apnea).  Talk to your health care provider about getting a sleep evaluation if you snore a lot or have excessive sleepiness.  Take medicines only as directed by your health care provider.  For some people, aspirin or blood thinners (anticoagulants) are helpful in reducing the risk of forming abnormal blood clots that can lead to stroke. If you have the irregular heart rhythm of atrial fibrillation, you should be on a blood thinner unless there is a good reason you cannot take them.  Understand all your medicine instructions.  Make sure that other conditions (such as anemia or atherosclerosis) are addressed. SEEK IMMEDIATE MEDICAL CARE IF:   You have sudden weakness or numbness of the face, arm, or leg, especially on one side of the body.  Your face or eyelid droops to one side.  You have sudden confusion.  You have trouble speaking (aphasia) or understanding.  You have sudden trouble seeing in one or both eyes.  You have sudden trouble walking.  You have dizziness.  You have a loss of balance or coordination.  You have a sudden, severe headache with no known cause.  You have new chest pain or an irregular heartbeat. Any of these symptoms may represent a serious problem that is an emergency. Do not wait to see if the symptoms will   go away. Get medical help at once. Call your local emergency services (911 in [place] Do not drive yourself to the hospital.   This information is not intended to replace advice given to you by your health care provider. Make sure you discuss any questions you have with your health care provider.   Document Released: 01/20/2005 Document Revised: 01/03/2015 Document Reviewed:  06/15/2013 Elsevier Interactive Patient Education 2016 Elsevier Inc.  

## 2016-11-08 ENCOUNTER — Other Ambulatory Visit: Payer: Self-pay | Admitting: *Deleted

## 2016-11-08 DIAGNOSIS — I6523 Occlusion and stenosis of bilateral carotid arteries: Secondary | ICD-10-CM

## 2016-11-10 ENCOUNTER — Ambulatory Visit: Payer: Self-pay | Admitting: Family

## 2016-11-10 ENCOUNTER — Encounter (HOSPITAL_COMMUNITY): Payer: Self-pay

## 2016-12-21 ENCOUNTER — Other Ambulatory Visit: Payer: Self-pay | Admitting: *Deleted

## 2016-12-21 MED ORDER — RIVAROXABAN 20 MG PO TABS: 20.0000 mg | ORAL_TABLET | Freq: Every day | ORAL | 0 refills | Status: DC

## 2017-04-07 ENCOUNTER — Other Ambulatory Visit: Payer: Self-pay

## 2017-04-07 DIAGNOSIS — I739 Peripheral vascular disease, unspecified: Secondary | ICD-10-CM

## 2017-04-21 ENCOUNTER — Ambulatory Visit (HOSPITAL_COMMUNITY)
Admission: RE | Admit: 2017-04-21 | Discharge: 2017-04-21 | Disposition: A | Discharge status: Home / Self Care | Payer: Medicare Other | Source: Ambulatory Visit | Attending: Surgery | Admitting: Surgery

## 2017-04-21 DIAGNOSIS — I739 Peripheral vascular disease, unspecified: Secondary | ICD-10-CM | POA: Diagnosis not present

## 2017-05-02 NOTE — Progress Notes (Signed)
Cardiology Office Note  Date: 05/03/2017   ID: Martha Torres 07-03-1946, MRN [id number]  PCP: Celene Squibb, MD  Primary Cardiologist: Rozann Lesches, MD   Chief Complaint  Patient presents with  . PAF    History of Present Illness: Martha Torres is a 71 y.o. female that I met back in November 2017. She presents for a routine follow-up visit. Continues to follow with Dr. Nevada Torres for primary care. She does report occasional palpitations, no prolonged events or chest pain however.  CHADSVASC score is 5. She continues on Xarelto for stroke prophylaxis. She is not reporting any spontaneous bleeding problems. States that blood work has been done by Dr. Nevada Torres, we are requesting the results.  She has also had follow-up with [facility]. Recent carotid Dopplers and lower extremity ABIs are outlined below.  Past Medical History:  Diagnosis Date  . Carotid artery occlusion    Right CEA in 2002  . Chest pain 2006   Stress nuclear-anteroapical and basilar inferior reversible defects; cath-normal coronary arteries and EF  . Chronic low back pain    MRI 2014: Spondylolisthesis of L4-L5 with foraminal narrowing, most prominent on the right and facet arthropathy.  L4-L5 surgery planned for 04/2013 with left-sided pedicle screw fixation.  . Degenerative joint disease   . DVT (deep venous thrombosis) ([place])   . Essential hypertension   . Gastroesophageal reflux    H/o stricture & hiatal hernia  . Hyperlipidemia   . MRSA (methicillin resistant Staphylococcus aureus) June 2014  . Paroxysmal atrial fibrillation ([place]) 04/2013  . Type 2 diabetes mellitus ([place])     Past Surgical History:  Procedure Laterality Date  . CAROTID ENDARTERECTOMY Right 10/25/2001   Subsequent to episode of syncope  . LAMINECTOMY  2000   L5-S1 discectomy and laminectomy; 3 other surgical procedures subsequently performed  . LAPAROSCOPIC CHOLECYSTECTOMY    . SPINE SURGERY  05-23-13   X's 5 surgeries  . VAGINAL HYSTERECTOMY       Current Outpatient Prescriptions  Medication Sig Dispense Refill  . Acetaminophen (TYLENOL ARTHRITIS PAIN PO) Take 1 tablet by mouth daily.     Marland Kitchen ALPRAZolam (XANAX) 0.5 MG tablet Take 0.5 mg by mouth at bedtime as needed for anxiety.    Marland Kitchen atenolol (TENORMIN) 25 MG tablet Take 1 tablet by mouth 2 (two) times daily.    . chlorthalidone (HYGROTON) 25 MG tablet Take  One tablet daily    . gabapentin (NEURONTIN) 300 MG capsule Take 300 mg by mouth every 8 (eight) hours.    Marland Kitchen glipiZIDE (GLUCOTROL XL) 10 MG 24 hr tablet Take 1 tablet by mouth daily.    Marland Kitchen levothyroxine (SYNTHROID, LEVOTHROID) 50 MCG tablet Take 50 mcg by mouth daily.    Marland Kitchen losartan (COZAAR) 50 MG tablet Take 1 tablet by mouth daily.    Marland Kitchen omeprazole (PRILOSEC) 20 MG capsule Take 20 mg by mouth daily.    . potassium chloride SA (K-DUR,KLOR-CON) 20 MEQ tablet Take 20 mEq by mouth 2 (two) times daily.     . rivaroxaban (XARELTO) 20 MG TABS tablet Take 1 tablet (20 mg total) by mouth daily with supper. 7 tablet 0  . simvastatin (ZOCOR) 20 MG tablet Take 20 mg by mouth at bedtime.     No current facility-administered medications for this visit.    Allergies:  Patient has no known allergies.   Social History: The patient  reports that she quit smoking about 8 years ago. Her smoking use included  Cigarettes. She started smoking about 52 years ago. She has a 10.75 pack-year smoking history. She has never used smokeless tobacco. She reports that she drinks about 0.5 oz of alcohol per week . She reports that she uses drugs.   ROS:  Please see the history of present illness. Otherwise, complete review of systems is positive for leg cramps.  All other systems are reviewed and negative.   Physical Exam: VS:  BP 121/70   Pulse 67   Ht 5' 5" (1.651 m)   Wt 226 lb (102.5 kg)   BMI 37.61 kg/m , BMI Body mass index is 37.61 kg/m.  Wt Readings from Last 3 Encounters:  05/03/17 226 lb (102.5 kg)  11/05/16 223 lb (101.2 kg)  11/01/16 224  lb (101.6 kg)    General: Obese woman, appears comfortable at rest. HEENT: Conjunctiva and lids normal, oropharynx clear. Neck: Supple, no elevated JVP or carotid bruits, no thyromegaly. Lungs: Clear to auscultation, nonlabored breathing at rest. Cardiac: Regular rate and rhythm, no S3. soft systolic murmur, no pericardial rub. Abdomen: Soft, nontender, bowel sounds present, no guarding or rebound. Extremities: Trace ankle edema, distal pulses 1-2+. Skin: Warm and dry. Musculoskeletal: No kyphosis. Neuropsychiatric: Alert and oriented x3, affect grossly appropriate.  ECG: I personally reviewed the tracing from 11/01/2016 which showed normal sinus rhythm.  Other Studies Reviewed Today:  Lower extremity ABIs 04/21/2017: 1.14 on the right, 1.16 on the left, normal bilateral toe-brachial indices.  Carotid Dopplers 11/05/2016: Patent right CEA site, 82-99% LICA stenosis.  Assessment and Plan:  1. Paroxysmal atrial fibrillation. Reports occasional palpitations, no progression in symptoms. Plan to continue current dose of atenolol. Also on Xarelto for stroke prophylaxis with CHADSVASC score 5. Requesting most recent lab work from Dr. Nevada Torres.  2. History of carotid artery disease status post right CEA in 2002. Recent carotid Dopplers showed patent right CEA site with 37-16% LICA stenosis. She remains on statin therapy.  3. Hyperlipidemia on Zocor. Last LDL 78. She continues to follow with Dr. Nevada Torres.  4. Essential hypertension, blood pressure is well controlled today. She has been switched from lisinopril to Cozaar by PCP.  Current medicines were reviewed with the patient today.  Disposition: Follow-up in 6 months.  Signed, Satira Sark, MD, [facility] 05/03/2017 8:10 AM    [facility] [place], [place], [place] [address] Phone: [phone]; Fax: [phone]

## 2017-05-03 ENCOUNTER — Encounter: Payer: Self-pay | Admitting: *Deleted

## 2017-05-03 ENCOUNTER — Ambulatory Visit (INDEPENDENT_AMBULATORY_CARE_PROVIDER_SITE_OTHER): Payer: Medicare Other | Admitting: Cardiology

## 2017-05-03 ENCOUNTER — Encounter: Payer: Self-pay | Admitting: Cardiology

## 2017-05-03 VITALS — BP 121/70 | HR 67 | Ht 65.0 in | Wt 226.0 lb

## 2017-05-03 DIAGNOSIS — I480 Paroxysmal atrial fibrillation: Secondary | ICD-10-CM | POA: Diagnosis not present

## 2017-05-03 DIAGNOSIS — E782 Mixed hyperlipidemia: Secondary | ICD-10-CM | POA: Diagnosis not present

## 2017-05-03 DIAGNOSIS — I10 Essential (primary) hypertension: Secondary | ICD-10-CM

## 2017-05-03 DIAGNOSIS — I6523 Occlusion and stenosis of bilateral carotid arteries: Secondary | ICD-10-CM | POA: Diagnosis not present

## 2017-05-03 NOTE — Patient Instructions (Signed)

## 2017-05-16 ENCOUNTER — Telehealth: Payer: Self-pay | Admitting: Cardiology

## 2017-05-16 NOTE — Telephone Encounter (Signed)
Patient is taking potassium 20 mEq BID. At last visit with PCP patient was told she could stop taking potassium as it was not necessary. Patient did not stop taking it.

## 2017-05-16 NOTE — Telephone Encounter (Signed)
Went to [facility] yesterday for AFIB.  Michela Pitcher that they advised her to call here first thing Monday morning.  Told her her potassium was low and that she went out of Afib as soon as they did her EKG>  She is not currently in AFIB

## 2017-05-16 NOTE — Telephone Encounter (Signed)
Patient stated she went to the ER yesterday due to having chest pain. Patient stated she went to [facility] and was in A-fib. Potassium level was 3.0. Hospital did another EKG and it was normal. Patient stated they gave her a bag of potassium and 2 potassium pills. Patient was told that her potassium level was putting her in A-fib. Patient feels much better today, but was told to call cardiologist. Requested records from [facility].

## 2017-05-16 NOTE — Telephone Encounter (Signed)
Patient notified and verbalized understanding. 

## 2017-05-16 NOTE — Telephone Encounter (Signed)
Noted. I reviewed the records from [facility]. She is on chlorthalidone as an outpatient which can contribute to hypokalemia, and by my last note was on KCl 20 mEq twice daily. Please check with her on her current outpatient dose of potassium as this may need to be adjusted. She has been following lab work with PCP Dr. Nevada Crane.

## 2017-05-16 NOTE — Telephone Encounter (Signed)
It sounds like she does need to maintain regular potassium supplementation. My suggestion would be for her to continue with the present regimen and have a follow-up check of potassium and magnesium with her PCP in about 2 weeks. She may even need to be on a higher dose.

## 2017-06-13 ENCOUNTER — Telehealth: Payer: Self-pay | Admitting: Cardiology

## 2017-06-13 NOTE — Telephone Encounter (Signed)
Patient would like to know if she can take Tylenol while taking Martha Torres

## 2017-06-13 NOTE — Telephone Encounter (Signed)
Returned call to patient - left detailed message that Tylenol is best option while on Xarelto.  Avoid Ibuprofen, ASA, and other NSAID's.

## 2017-08-08 ENCOUNTER — Telehealth: Payer: Self-pay | Admitting: Cardiology

## 2017-08-08 NOTE — Telephone Encounter (Signed)
Patient called requesting to see if we have samples of Xarelto 20 mg.

## 2017-08-09 MED ORDER — RIVAROXABAN 20 MG PO TABS: 20.0000 mg | ORAL_TABLET | Freq: Every day | ORAL | 0 refills | Status: DC

## 2017-08-09 NOTE — Telephone Encounter (Signed)
LM to return call.

## 2017-08-09 NOTE — Telephone Encounter (Signed)
Pt will come back tomorrow to pick up samples of Xarelto

## 2017-08-31 DIAGNOSIS — M25562 Pain in left knee: Secondary | ICD-10-CM | POA: Diagnosis not present

## 2017-08-31 DIAGNOSIS — M25561 Pain in right knee: Secondary | ICD-10-CM | POA: Diagnosis not present

## 2017-08-31 DIAGNOSIS — M170 Bilateral primary osteoarthritis of knee: Secondary | ICD-10-CM | POA: Diagnosis not present

## 2017-09-02 DIAGNOSIS — Z1159 Encounter for screening for other viral diseases: Secondary | ICD-10-CM | POA: Diagnosis not present

## 2017-09-02 DIAGNOSIS — I10 Essential (primary) hypertension: Secondary | ICD-10-CM | POA: Diagnosis not present

## 2017-09-02 DIAGNOSIS — E039 Hypothyroidism, unspecified: Secondary | ICD-10-CM | POA: Diagnosis not present

## 2017-09-02 DIAGNOSIS — E119 Type 2 diabetes mellitus without complications: Secondary | ICD-10-CM | POA: Diagnosis not present

## 2017-09-06 DIAGNOSIS — E119 Type 2 diabetes mellitus without complications: Secondary | ICD-10-CM | POA: Diagnosis not present

## 2017-09-06 DIAGNOSIS — M130 Polyarthritis, unspecified: Secondary | ICD-10-CM | POA: Diagnosis not present

## 2017-09-06 DIAGNOSIS — I10 Essential (primary) hypertension: Secondary | ICD-10-CM | POA: Diagnosis not present

## 2017-09-06 DIAGNOSIS — K219 Gastro-esophageal reflux disease without esophagitis: Secondary | ICD-10-CM | POA: Diagnosis not present

## 2017-09-06 DIAGNOSIS — I482 Chronic atrial fibrillation: Secondary | ICD-10-CM | POA: Diagnosis not present

## 2017-09-22 DIAGNOSIS — M25562 Pain in left knee: Secondary | ICD-10-CM | POA: Diagnosis not present

## 2017-09-22 DIAGNOSIS — M1712 Unilateral primary osteoarthritis, left knee: Secondary | ICD-10-CM | POA: Diagnosis not present

## 2017-10-25 DIAGNOSIS — M47816 Spondylosis without myelopathy or radiculopathy, lumbar region: Secondary | ICD-10-CM | POA: Diagnosis not present

## 2017-11-08 DIAGNOSIS — I10 Essential (primary) hypertension: Secondary | ICD-10-CM | POA: Diagnosis not present

## 2017-11-09 ENCOUNTER — Other Ambulatory Visit: Payer: Self-pay

## 2017-11-09 ENCOUNTER — Ambulatory Visit (HOSPITAL_COMMUNITY)
Admission: RE | Admit: 2017-11-09 | Discharge: 2017-11-09 | Disposition: A | Discharge status: Home / Self Care | Payer: Medicare Other | Source: Ambulatory Visit | Attending: Family | Admitting: Family

## 2017-11-09 ENCOUNTER — Encounter: Payer: Self-pay | Admitting: Vascular Surgery

## 2017-11-09 ENCOUNTER — Ambulatory Visit: Payer: Medicare Other | Admitting: Vascular Surgery

## 2017-11-09 VITALS — BP 140/68 | HR 74 | Temp 98.0°F | Resp 16 | Ht 65.0 in | Wt 218.0 lb

## 2017-11-09 DIAGNOSIS — I6523 Occlusion and stenosis of bilateral carotid arteries: Secondary | ICD-10-CM | POA: Diagnosis not present

## 2017-11-09 LAB — VAS US CAROTID
LCCADDIAS: 19 cm/s
LEFT ECA DIAS: 22 cm/s
Left CCA dist sys: 89 cm/s
Left CCA prox dias: 19 cm/s
Left CCA prox sys: 82 cm/s
Left ICA dist dias: -24 cm/s
Left ICA dist sys: -92 cm/s
Left ICA prox dias: -51 cm/s
Left ICA prox sys: -166 cm/s
RCCADSYS: -57 cm/s
RIGHT CCA MID DIAS: 19 cm/s
RIGHT ECA DIAS: -12 cm/s
Right CCA prox dias: 14 cm/s
Right CCA prox sys: 101 cm/s

## 2017-11-09 NOTE — Progress Notes (Signed)
Cardiology Office Note  Date: 11/10/2017   ID: Martha Torres, DOB 04-27-1946, MRN [id number]  PCP: Celene Squibb, MD  Primary Cardiologist: Rozann Lesches, MD   Chief Complaint  Patient presents with  . PAF    History of Present Illness: Martha Torres is a 71 y.o. female last seen in May.  She presents for a routine follow-up visit.  She denies any palpitations or chest pain.  States that she has had some lightheadedness recently, specifically describes vertigo symptoms.  She states that she takes meclizine for this.  She has also had medications adjusted by PCP including an increase in losartan.  She was asked to switch from atenolol to Cardizem CD, but decided not to make this change.  She continues to follow with [facility], history of carotid artery disease.  She saw Dr. Scot Dock yesterday.  Carotid Dopplers demonstrated right CEA site patent with 44-03% LICA stenosis.  I reviewed her medications which are outlined below.  Cardiac regimen includes atenolol and Xarelto.  She does not report any bleeding problems.  Lab work has been followed by Dr. Nevada Crane.  Past Medical History:  Diagnosis Date  . Carotid artery occlusion    Right CEA in 2002  . Chest pain 2006   Stress nuclear-anteroapical and basilar inferior reversible defects; cath-normal coronary arteries and EF  . Chronic low back pain    MRI 2014: Spondylolisthesis of L4-L5 with foraminal narrowing, most prominent on the right and facet arthropathy.  L4-L5 surgery planned for 04/2013 with left-sided pedicle screw fixation.  . Degenerative joint disease   . DVT (deep venous thrombosis) ([place])   . Essential hypertension   . Gastroesophageal reflux    H/o stricture & hiatal hernia  . Hyperlipidemia   . MRSA (methicillin resistant Staphylococcus aureus) June 2014  . Paroxysmal atrial fibrillation ([place]) 04/2013  . Type 2 diabetes mellitus ([place])     Past Surgical History:  Procedure Laterality Date  . CAROTID ENDARTERECTOMY  Right 10/25/2001   Subsequent to episode of syncope  . LAMINECTOMY  2000   L5-S1 discectomy and laminectomy; 3 other surgical procedures subsequently performed  . LAPAROSCOPIC CHOLECYSTECTOMY    . SPINE SURGERY  05-23-13   X's 5 surgeries  . VAGINAL HYSTERECTOMY      Current Outpatient Medications  Medication Sig Dispense Refill  . Acetaminophen (TYLENOL ARTHRITIS PAIN PO) Take 1 tablet by mouth daily.     Marland Kitchen ALPRAZolam (XANAX) 0.5 MG tablet Take 0.5 mg by mouth at bedtime as needed for anxiety.    Marland Kitchen atenolol (TENORMIN) 25 MG tablet Take 1 tablet by mouth 2 (two) times daily.    . chlorthalidone (HYGROTON) 25 MG tablet Take  One tablet daily    . gabapentin (NEURONTIN) 300 MG capsule Take 300 mg by mouth every 8 (eight) hours.    Marland Kitchen glipiZIDE (GLUCOTROL XL) 10 MG 24 hr tablet Take 1 tablet by mouth daily.    Marland Kitchen levothyroxine (SYNTHROID, LEVOTHROID) 50 MCG tablet Take 50 mcg by mouth daily.    Marland Kitchen losartan (COZAAR) 100 MG tablet Take 100 mg daily by mouth.    . Magnesium 250 MG TABS Take by mouth.    . meclizine (ANTIVERT) 25 MG tablet Take 25 mg 3 (three) times daily as needed by mouth for dizziness.    Marland Kitchen omeprazole (PRILOSEC) 20 MG capsule Take 20 mg by mouth daily.    . potassium chloride SA (K-DUR,KLOR-CON) 20 MEQ tablet Take 20 mEq 2 (two) times daily  by mouth.     . rivaroxaban (XARELTO) 20 MG TABS tablet Take 1 tablet (20 mg total) by mouth daily with supper. 21 tablet 0  . simvastatin (ZOCOR) 20 MG tablet Take 20 mg by mouth at bedtime.     No current facility-administered medications for this visit.    Allergies:  Patient has no known allergies.   Social History: The patient  reports that she quit smoking about 9 years ago. Her smoking use included cigarettes. She started smoking about 52 years ago. She has a 10.75 pack-year smoking history. she has never used smokeless tobacco. She reports that she drinks about 0.5 oz of alcohol per week. She reports that she uses drugs.   ROS:   Please see the history of present illness. Otherwise, complete review of systems is positive for fatigue and vertigo.  All other systems are reviewed and negative.   Physical Exam: VS:  BP 130/64   Pulse 85   Ht 5\' 5"  (1.651 m)   Wt 222 lb (100.7 kg)   SpO2 98%   BMI 36.94 kg/m , BMI Body mass index is 36.94 kg/m.  Wt Readings from Last 3 Encounters:  11/10/17 222 lb (100.7 kg)  11/09/17 218 lb (98.9 kg)  05/03/17 226 lb (102.5 kg)    General: Obese woman, appears comfortable at rest. HEENT: Conjunctiva and lids normal, oropharynx clear. Neck: Supple, no elevated JVP or carotid bruits, no thyromegaly. Lungs: Clear to auscultation, nonlabored breathing at rest. Cardiac: Regular rate and rhythm, no S3, soft systolic murmur, no pericardial rub. Abdomen: Soft, nontender, bowel sounds present, no guarding or rebound. Extremities: Trace ankle edema, distal pulses 2+. Skin: Warm and dry. Musculoskeletal: No kyphosis. Neuropsychiatric: Alert and oriented x3, affect grossly appropriate.  ECG: I personally reviewed the tracing from 05/15/2017 which showed sinus rhythm. 05/15/2017 which showed sinus rhythm.  Recent Labwork:  May 2018: BUN 12, creatinine 0.69 May 2018: BUN 12, creatinine 0.69, potassium 3.0, AST 12.6, ALT 14, TSH 1.74, troponin T less than 0.01, potassium 3.0, AST 13, ALT 14, TSH 1.74, troponin T less than 0.01, hemoglobin 12.1, platelets 314, hemoglobin 12.1, platelets 314  Other Studies Reviewed Today:  Carotid Dopplers 11/05/2016: Patent right CEA site with 15-17% LICA stenosis.  Assessment and Plan:  1.  Paroxysmal atrial fibrillation.  She reports no increasing sense of palpitations and heart rate is regular today.  Continue Xarelto with CHADSVASC score of 5.  Continue to get regular lab work with Dr. Nevada Crane.  Would recommend that she stay on atenolol instead of switching to Cardizem CD since she has tolerated this fairly well from a rhythm perspective.  2.  Essential  hypertension, recent adjustments made by PCP.  I asked her to stay on atenolol and Cozaar at current doses, write down her blood pressure, and present these results to her PCP at follow-up.  She thinks that the higher dose of losartan is making her feel more fatigued.  3.  Carotid artery disease status post right CEA with moderate LICA stenosis that is stable.  She follows with Dr. Scot Dock.  She continues on statin therapy.  4.  Hyperlipidemia, on Zocor.  Current medicines were reviewed with the patient today.  Disposition:  Follow-up in 6 months.  Signed, Satira Sark, MD, [facility] 11/10/2017 9:55 AM    [facility] [place]. [address], [place], [place] [address] Phone: [phone]; Fax: [phone]

## 2017-11-09 NOTE — Progress Notes (Signed)
Patient name: Martha Torres MRN: [id number] DOB: 26-Dec-1946 Sex: female  REASON FOR VISIT:   Follow-up of carotid disease.  HPI:   Martha Torres is a pleasant 71 y.o. female who underwent a right carotid endarterectomy in 2002 by Dr. Drucie Opitz.  We have been following a moderate left carotid stenosis.  Since she was seen 1 year ago by the nurse practitioner, she denies any history of stroke, TIAs, expressive or receptive aphasia, or amaurosis fugax.  She has had some problems with dizziness which she attributes to being on multiple medications.  Past Medical History:  Diagnosis Date  . Carotid artery occlusion    Right CEA in 2002  . Chest pain 2006   Stress nuclear-anteroapical and basilar inferior reversible defects; cath-normal coronary arteries and EF  . Chronic low back pain    MRI 2014: Spondylolisthesis of L4-L5 with foraminal narrowing, most prominent on the right and facet arthropathy.  L4-L5 surgery planned for 04/2013 with left-sided pedicle screw fixation.  . Degenerative joint disease   . DVT (deep venous thrombosis) ([place])   . Essential hypertension   . Gastroesophageal reflux    H/o stricture & hiatal hernia  . Hyperlipidemia   . MRSA (methicillin resistant Staphylococcus aureus) June 2014  . Paroxysmal atrial fibrillation ([place]) 04/2013  . Type 2 diabetes mellitus ([facility])     Family History  Problem Relation Age of Onset  . Heart attack Father        Also mother  . Cancer - Prostate Father   . Heart disease Father        Heart Disease before age 35  . Hyperlipidemia Father   . Hypertension Sister   . Hyperlipidemia Sister   . Cancer Sister   . Stroke Mother   . Heart disease Mother        Heart Disease before age 59  . Hyperlipidemia Mother   . Heart attack Mother   . Diabetes Sister   . Hyperlipidemia Sister   . Hypertension Sister   . Hyperlipidemia Brother   . Cancer Brother        Luekemia  . Hypertension Brother     SOCIAL HISTORY: Social  History   Tobacco Use  . Smoking status: Former Smoker    Packs/day: 0.25    Years: 43.00    Pack years: 10.75    Types: Cigarettes    Start date: 03/10/1965    Last attempt to quit: 11/06/2008    Years since quitting: 9.0  . Smokeless tobacco: Never Used  Substance Use Topics  . Alcohol use: Yes    Alcohol/week: 0.5 oz    Types: 1 Standard drinks or equivalent per week    No Known Allergies  Current Outpatient Medications  Medication Sig Dispense Refill  . Acetaminophen (TYLENOL ARTHRITIS PAIN PO) Take 1 tablet by mouth daily.     Marland Kitchen ALPRAZolam (XANAX) 0.5 MG tablet Take 0.5 mg by mouth at bedtime as needed for anxiety.    . chlorthalidone (HYGROTON) 25 MG tablet Take  One tablet daily    . gabapentin (NEURONTIN) 300 MG capsule Take 300 mg by mouth every 8 (eight) hours.    Marland Kitchen glipiZIDE (GLUCOTROL XL) 10 MG 24 hr tablet Take 1 tablet by mouth daily.    Marland Kitchen levothyroxine (SYNTHROID, LEVOTHROID) 50 MCG tablet Take 50 mcg by mouth daily.    Marland Kitchen losartan (COZAAR) 50 MG tablet Take 1 tablet by mouth daily.    Marland Kitchen omeprazole (PRILOSEC) 20  MG capsule Take 20 mg by mouth daily.    . potassium chloride SA (K-DUR,KLOR-CON) 20 MEQ tablet Take 20 mEq by mouth 2 (two) times daily.     . rivaroxaban (XARELTO) 20 MG TABS tablet Take 1 tablet (20 mg total) by mouth daily with supper. 21 tablet 0  . simvastatin (ZOCOR) 20 MG tablet Take 20 mg by mouth at bedtime.    Marland Kitchen atenolol (TENORMIN) 25 MG tablet Take 1 tablet by mouth 2 (two) times daily.     No current facility-administered medications for this visit.     REVIEW OF SYSTEMS:  [X]  denotes positive finding, [ ]  denotes negative finding Cardiac  Comments:  Chest pain or chest pressure:    Shortness of breath upon exertion:    Short of breath when lying flat:    Irregular heart rhythm:        Vascular    Pain in calf, thigh, or hip brought on by ambulation:    Pain in feet at night that wakes you up from your sleep:     Blood clot in your  veins:    Leg swelling:         Pulmonary    Oxygen at home:    Productive cough:     Wheezing:         Neurologic    Sudden weakness in arms or legs:     Sudden numbness in arms or legs:     Sudden onset of difficulty speaking or slurred speech:    Temporary loss of vision in one eye:     Problems with dizziness:         Gastrointestinal    Blood in stool:     Vomited blood:         Genitourinary    Burning when urinating:     Blood in urine:        Psychiatric    Major depression:         Hematologic    Bleeding problems:    Problems with blood clotting too easily:        Skin    Rashes or ulcers:        Constitutional    Fever or chills:     PHYSICAL EXAM:   Vitals:   11/09/17 1100 11/09/17 1108  BP: 137/78 140/68  Pulse: 74 74  Resp: 16   Temp: 98 F (36.7 C)   TempSrc: Oral   SpO2: 96%   Weight: 218 lb (98.9 kg)   Height: 5\' 5"  (1.651 m)     GENERAL: The patient is a well-nourished female, in no acute distress. The vital signs are documented above. CARDIAC: There is a regular rate and rhythm.  VASCULAR: I do not detect carotid bruits. Both feet are warm and well-perfused. She has no extracranial lower extremity swelling. PULMONARY: There is good air exchange bilaterally without wheezing or rales. ABDOMEN: Soft and non-tender with normal pitched bowel sounds.  MUSCULOSKELETAL: There are no major deformities or cyanosis. NEUROLOGIC: No focal weakness or paresthesias are detected. SKIN: There are no ulcers or rashes noted. PSYCHIATRIC: The patient has a normal affect.  DATA:    CAROTID DUPLEX: I have independently interpreted her carotid duplex scan.  On the right side, the right carotid endarterectomy site is widely patent.  On the left side there is a 40-59% stenosis.  This is not changed over the last year.  Both vertebral arteries are patent with antegrade flow.  MEDICAL  ISSUES:   ASYMPTOMATIC 40-59% LEFT CAROTID STENOSIS: I explained  that we would not consider left carotid endarterectomy unless the stenosis progressed to greater than 80%.  I have ordered a follow-up carotid duplex scan in 1 year.  She is on Xarelto so she does not take aspirin.  She is on a statin.  She is not a smoker.  I do not think that her dizziness can be attributed to vertebrobasilar disease as both vertebral arteries are patent with antegrade flow.  Likewise he has only a moderate left carotid stenosis with a widely patent right carotid endarterectomy site.  Deitra Mayo [facility] [phone]

## 2017-11-10 ENCOUNTER — Encounter: Payer: Self-pay | Admitting: Cardiology

## 2017-11-10 ENCOUNTER — Ambulatory Visit: Payer: Medicare Other | Admitting: Cardiology

## 2017-11-10 VITALS — BP 130/64 | HR 85 | Ht 65.0 in | Wt 222.0 lb

## 2017-11-10 DIAGNOSIS — I10 Essential (primary) hypertension: Secondary | ICD-10-CM

## 2017-11-10 DIAGNOSIS — I480 Paroxysmal atrial fibrillation: Secondary | ICD-10-CM | POA: Diagnosis not present

## 2017-11-10 DIAGNOSIS — E782 Mixed hyperlipidemia: Secondary | ICD-10-CM

## 2017-11-10 DIAGNOSIS — I6523 Occlusion and stenosis of bilateral carotid arteries: Secondary | ICD-10-CM | POA: Diagnosis not present

## 2017-11-10 MED ORDER — RIVAROXABAN 20 MG PO TABS: 20.0000 mg | ORAL_TABLET | Freq: Every day | ORAL | 0 refills | Status: DC

## 2017-11-10 NOTE — Patient Instructions (Signed)

## 2017-11-10 NOTE — Addendum Note (Signed)
Addended by: Acquanetta Chain on: 11/10/2017 09:57 AM   Modules accepted: Orders

## 2017-11-15 DIAGNOSIS — R42 Dizziness and giddiness: Secondary | ICD-10-CM | POA: Diagnosis not present

## 2017-11-23 DIAGNOSIS — M1990 Unspecified osteoarthritis, unspecified site: Secondary | ICD-10-CM | POA: Diagnosis not present

## 2017-11-23 DIAGNOSIS — M79671 Pain in right foot: Secondary | ICD-10-CM | POA: Diagnosis not present

## 2017-11-24 NOTE — Addendum Note (Signed)
Addended by: Lianne Cure A on: 11/24/2017 10:10 AM   Modules accepted: Orders

## 2017-11-28 DIAGNOSIS — M79602 Pain in left arm: Secondary | ICD-10-CM | POA: Diagnosis not present

## 2017-11-28 DIAGNOSIS — K449 Diaphragmatic hernia without obstruction or gangrene: Secondary | ICD-10-CM | POA: Diagnosis not present

## 2017-11-28 DIAGNOSIS — Z79899 Other long term (current) drug therapy: Secondary | ICD-10-CM | POA: Diagnosis not present

## 2017-11-28 DIAGNOSIS — R112 Nausea with vomiting, unspecified: Secondary | ICD-10-CM | POA: Diagnosis not present

## 2017-11-28 DIAGNOSIS — M5136 Other intervertebral disc degeneration, lumbar region: Secondary | ICD-10-CM | POA: Diagnosis not present

## 2017-11-28 DIAGNOSIS — H8110 Benign paroxysmal vertigo, unspecified ear: Secondary | ICD-10-CM | POA: Diagnosis not present

## 2017-11-28 DIAGNOSIS — E039 Hypothyroidism, unspecified: Secondary | ICD-10-CM | POA: Diagnosis not present

## 2017-11-28 DIAGNOSIS — Z9049 Acquired absence of other specified parts of digestive tract: Secondary | ICD-10-CM | POA: Diagnosis not present

## 2017-11-28 DIAGNOSIS — I480 Paroxysmal atrial fibrillation: Secondary | ICD-10-CM | POA: Diagnosis not present

## 2017-11-28 DIAGNOSIS — R55 Syncope and collapse: Secondary | ICD-10-CM | POA: Diagnosis not present

## 2017-11-28 DIAGNOSIS — I10 Essential (primary) hypertension: Secondary | ICD-10-CM | POA: Diagnosis not present

## 2017-11-28 DIAGNOSIS — H6503 Acute serous otitis media, bilateral: Secondary | ICD-10-CM | POA: Diagnosis not present

## 2017-11-28 DIAGNOSIS — E1140 Type 2 diabetes mellitus with diabetic neuropathy, unspecified: Secondary | ICD-10-CM | POA: Diagnosis not present

## 2017-11-28 DIAGNOSIS — Z8249 Family history of ischemic heart disease and other diseases of the circulatory system: Secondary | ICD-10-CM | POA: Diagnosis not present

## 2017-11-28 DIAGNOSIS — R42 Dizziness and giddiness: Secondary | ICD-10-CM | POA: Diagnosis not present

## 2017-11-28 DIAGNOSIS — Z7901 Long term (current) use of anticoagulants: Secondary | ICD-10-CM | POA: Diagnosis not present

## 2017-11-28 DIAGNOSIS — Z8711 Personal history of peptic ulcer disease: Secondary | ICD-10-CM | POA: Diagnosis not present

## 2017-11-29 DIAGNOSIS — E039 Hypothyroidism, unspecified: Secondary | ICD-10-CM | POA: Diagnosis not present

## 2017-11-29 DIAGNOSIS — I10 Essential (primary) hypertension: Secondary | ICD-10-CM | POA: Diagnosis not present

## 2017-11-29 DIAGNOSIS — H8110 Benign paroxysmal vertigo, unspecified ear: Secondary | ICD-10-CM | POA: Diagnosis not present

## 2017-11-29 DIAGNOSIS — E1140 Type 2 diabetes mellitus with diabetic neuropathy, unspecified: Secondary | ICD-10-CM | POA: Diagnosis not present

## 2017-11-29 DIAGNOSIS — M5136 Other intervertebral disc degeneration, lumbar region: Secondary | ICD-10-CM | POA: Diagnosis not present

## 2017-12-06 DIAGNOSIS — J069 Acute upper respiratory infection, unspecified: Secondary | ICD-10-CM | POA: Diagnosis not present

## 2017-12-08 ENCOUNTER — Other Ambulatory Visit: Payer: Self-pay | Admitting: Cardiology

## 2017-12-08 MED ORDER — RIVAROXABAN 20 MG PO TABS: 20.0000 mg | ORAL_TABLET | Freq: Every day | ORAL | 0 refills | Status: DC

## 2017-12-08 NOTE — Telephone Encounter (Signed)
Asking for samples of Xarelto

## 2017-12-08 NOTE — Telephone Encounter (Signed)
Pt aware that Xarelto samples are available for pickup. Pt will come by office to pick up before the weekend.

## 2017-12-12 DIAGNOSIS — E782 Mixed hyperlipidemia: Secondary | ICD-10-CM | POA: Diagnosis not present

## 2017-12-12 DIAGNOSIS — I10 Essential (primary) hypertension: Secondary | ICD-10-CM | POA: Diagnosis not present

## 2017-12-12 DIAGNOSIS — E039 Hypothyroidism, unspecified: Secondary | ICD-10-CM | POA: Diagnosis not present

## 2017-12-12 DIAGNOSIS — E119 Type 2 diabetes mellitus without complications: Secondary | ICD-10-CM | POA: Diagnosis not present

## 2017-12-13 DIAGNOSIS — E119 Type 2 diabetes mellitus without complications: Secondary | ICD-10-CM | POA: Diagnosis not present

## 2017-12-13 DIAGNOSIS — E039 Hypothyroidism, unspecified: Secondary | ICD-10-CM | POA: Diagnosis not present

## 2017-12-22 DIAGNOSIS — M170 Bilateral primary osteoarthritis of knee: Secondary | ICD-10-CM | POA: Diagnosis not present

## 2017-12-22 DIAGNOSIS — M25561 Pain in right knee: Secondary | ICD-10-CM | POA: Diagnosis not present

## 2017-12-22 DIAGNOSIS — M25562 Pain in left knee: Secondary | ICD-10-CM | POA: Diagnosis not present

## 2017-12-22 DIAGNOSIS — G8929 Other chronic pain: Secondary | ICD-10-CM | POA: Diagnosis not present

## 2018-01-24 DIAGNOSIS — M47816 Spondylosis without myelopathy or radiculopathy, lumbar region: Secondary | ICD-10-CM | POA: Diagnosis not present

## 2018-03-09 ENCOUNTER — Encounter: Payer: Self-pay | Admitting: Cardiology

## 2018-03-09 DIAGNOSIS — E119 Type 2 diabetes mellitus without complications: Secondary | ICD-10-CM | POA: Diagnosis not present

## 2018-03-09 DIAGNOSIS — E782 Mixed hyperlipidemia: Secondary | ICD-10-CM | POA: Diagnosis not present

## 2018-03-09 DIAGNOSIS — E039 Hypothyroidism, unspecified: Secondary | ICD-10-CM | POA: Diagnosis not present

## 2018-03-09 DIAGNOSIS — I10 Essential (primary) hypertension: Secondary | ICD-10-CM | POA: Diagnosis not present

## 2018-03-14 DIAGNOSIS — I10 Essential (primary) hypertension: Secondary | ICD-10-CM | POA: Diagnosis not present

## 2018-03-14 DIAGNOSIS — E039 Hypothyroidism, unspecified: Secondary | ICD-10-CM | POA: Diagnosis not present

## 2018-03-14 DIAGNOSIS — E119 Type 2 diabetes mellitus without complications: Secondary | ICD-10-CM | POA: Diagnosis not present

## 2018-03-14 DIAGNOSIS — I482 Chronic atrial fibrillation: Secondary | ICD-10-CM | POA: Diagnosis not present

## 2018-03-14 DIAGNOSIS — E782 Mixed hyperlipidemia: Secondary | ICD-10-CM | POA: Diagnosis not present

## 2018-03-21 DIAGNOSIS — M170 Bilateral primary osteoarthritis of knee: Secondary | ICD-10-CM | POA: Diagnosis not present

## 2018-04-07 DIAGNOSIS — E119 Type 2 diabetes mellitus without complications: Secondary | ICD-10-CM | POA: Diagnosis not present

## 2018-04-07 DIAGNOSIS — E1140 Type 2 diabetes mellitus with diabetic neuropathy, unspecified: Secondary | ICD-10-CM | POA: Diagnosis not present

## 2018-04-07 DIAGNOSIS — L84 Corns and callosities: Secondary | ICD-10-CM | POA: Diagnosis not present

## 2018-04-19 DIAGNOSIS — M47816 Spondylosis without myelopathy or radiculopathy, lumbar region: Secondary | ICD-10-CM | POA: Diagnosis not present

## 2018-04-19 DIAGNOSIS — I739 Peripheral vascular disease, unspecified: Secondary | ICD-10-CM | POA: Diagnosis not present

## 2018-04-21 ENCOUNTER — Encounter (HOSPITAL_COMMUNITY): Payer: Self-pay

## 2018-05-08 ENCOUNTER — Other Ambulatory Visit (HOSPITAL_COMMUNITY): Payer: Self-pay | Admitting: Neurosurgery

## 2018-05-08 DIAGNOSIS — I739 Peripheral vascular disease, unspecified: Secondary | ICD-10-CM

## 2018-05-09 ENCOUNTER — Ambulatory Visit (HOSPITAL_COMMUNITY)
Admission: RE | Admit: 2018-05-09 | Discharge: 2018-05-09 | Disposition: A | Discharge status: Home / Self Care | Payer: Medicare Other | Source: Ambulatory Visit | Attending: Family | Admitting: Family

## 2018-05-09 DIAGNOSIS — I739 Peripheral vascular disease, unspecified: Secondary | ICD-10-CM | POA: Diagnosis not present

## 2018-05-14 NOTE — Progress Notes (Signed)
Cardiology Office Note  Date: 05/15/2018   ID: Martha, Torres 1946-05-25, MRN [id number]  PCP: Celene Squibb, MD  Primary Cardiologist: Rozann Lesches, MD   Chief Complaint  Patient presents with  . PAF    History of Present Illness: Martha Torres is a 72 y.o. female last seen in November 2018.  She is here for a routine visit.  Reports no interval palpitations or chest pain.  Has been having trouble with intermittent leg swelling, also complicated by arthritic knee pain and swelling.  Recent ABIs were normal bilaterally in May 2019.  Current medications include Xarelto and atenolol.  She is also on chlorthalidone although this does not seem to be helping her leg swelling.  We are requesting her interval lab work from Dr. Nevada Crane.  She did not report any bleeding problems.  I personally reviewed her ECG today which shows sinus rhythm with nonspecific T wave changes.  Past Medical History:  Diagnosis Date  . Carotid artery occlusion    Right CEA in 2002  . Chest pain 2006   Stress nuclear-anteroapical and basilar inferior reversible defects; cath-normal coronary arteries and EF  . Chronic low back pain    MRI 2014: Spondylolisthesis of L4-L5 with foraminal narrowing, most prominent on the right and facet arthropathy.  L4-L5 surgery planned for 04/2013 with left-sided pedicle screw fixation.  . Degenerative joint disease   . DVT (deep venous thrombosis) ([place])   . Essential hypertension   . Gastroesophageal reflux    H/o stricture & hiatal hernia  . Hyperlipidemia   . MRSA (methicillin resistant Staphylococcus aureus) June 2014  . Paroxysmal atrial fibrillation ([place]) 04/2013  . Type 2 diabetes mellitus ([place])     Past Surgical History:  Procedure Laterality Date  . CAROTID ENDARTERECTOMY Right 10/25/2001   Subsequent to episode of syncope  . LAMINECTOMY  2000   L5-S1 discectomy and laminectomy; 3 other surgical procedures subsequently performed  . LAPAROSCOPIC  CHOLECYSTECTOMY    . SPINE SURGERY  05-23-13   X's 5 surgeries  . VAGINAL HYSTERECTOMY      Current Outpatient Medications  Medication Sig Dispense Refill  . Acetaminophen (TYLENOL ARTHRITIS PAIN PO) Take 1 tablet by mouth daily.     Marland Kitchen ALPRAZolam (XANAX) 0.5 MG tablet Take 0.5 mg by mouth at bedtime as needed for anxiety.    Marland Kitchen atenolol (TENORMIN) 25 MG tablet Take 1 tablet by mouth 2 (two) times daily.    . chlorthalidone (HYGROTON) 25 MG tablet Take  One tablet daily    . gabapentin (NEURONTIN) 300 MG capsule Take 300 mg by mouth every 8 (eight) hours.    Marland Kitchen glipiZIDE (GLUCOTROL XL) 10 MG 24 hr tablet Take 1 tablet by mouth daily.    Marland Kitchen levothyroxine (SYNTHROID, LEVOTHROID) 50 MCG tablet Take 50 mcg by mouth daily.    Marland Kitchen losartan (COZAAR) 100 MG tablet Take 100 mg daily by mouth.    . meclizine (ANTIVERT) 25 MG tablet Take 25 mg 3 (three) times daily as needed by mouth for dizziness.    Marland Kitchen omeprazole (PRILOSEC) 20 MG capsule Take 20 mg by mouth daily.    . potassium chloride SA (K-DUR,KLOR-CON) 20 MEQ tablet Take 20 mEq 2 (two) times daily by mouth.     . rivaroxaban (XARELTO) 20 MG TABS tablet Take 1 tablet (20 mg total) by mouth daily with supper. 28 tablet 0  . simvastatin (ZOCOR) 20 MG tablet Take 20 mg by mouth at bedtime.    Marland Kitchen  furosemide (LASIX) 20 MG tablet Take one tab by mouth every morning as needed for leg swelling. 30 tablet 6   No current facility-administered medications for this visit.    Allergies:  Patient has no known allergies.   Social History: The patient  reports that she quit smoking about 9 years ago. Her smoking use included cigarettes. She started smoking about 53 years ago. She has a 10.75 pack-year smoking history. She has never used smokeless tobacco. She reports that she drinks about 0.5 oz of alcohol per week. She reports that she has current or past drug history.   ROS:  Please see the history of present illness. Otherwise, complete review of systems is  positive for chronic arthritic knee pain.  All other systems are reviewed and negative.   Physical Exam: VS:  BP 132/60   Pulse 64   Ht 5\' 5"  (1.651 m)   Wt 220 lb (99.8 kg)   SpO2 98%   BMI 36.61 kg/m , BMI Body mass index is 36.61 kg/m.  Wt Readings from Last 3 Encounters:  05/15/18 220 lb (99.8 kg)  11/10/17 222 lb (100.7 kg)  11/09/17 218 lb (98.9 kg)    General: Patient appears comfortable at rest. HEENT: Conjunctiva and lids normal, oropharynx clear. Neck: Supple, no elevated JVP or carotid bruits, no thyromegaly. Lungs: Clear to auscultation, nonlabored breathing at rest. Cardiac: Regular rate and rhythm, no S3, soft systolic murmur. Abdomen: Soft, nontender, bowel sounds present. Extremities: 1+ lower leg edema, distal pulses 2+. Skin: Warm and dry. Musculoskeletal: No kyphosis. Neuropsychiatric: Alert and oriented x3, affect grossly appropriate.  ECG: I personally reviewed the tracing from 05/15/2017 which showed sinus rhythm. 05/15/2017 which showed sinus rhythm.  Recent Labwork:  May 2018: BUN 12, creatinine 0.69, potassium 3.0, AST 13, ALT 14, TSH 1.74, troponin T less than 0.01, hemoglobin 12.1, platelets 314, hemoglobin 12.1, platelets 314  Other Studies Reviewed Today:  Carotid Dopplers 11/09/2017: Patent RICA CEA site with 1-39% stenosis, 19-16% LICA stenosis.   Assessment and Plan:  1.  Paroxysmal atrial fibrillation with CHADSVASC score of 5.  She continues on Xarelto and atenolol.  ECG shows sinus rhythm today.  Requesting interval lab work from Dr. Nevada Crane.  2.  Intermittent leg swelling.  Prescription provided for as needed use of low-dose Lasix.  3.  Essential hypertension, reasonable blood pressure control today on current regimen.  Keep follow-up with Dr. Nevada Crane.  4.  Mild to moderate carotid artery disease as outlined above.  Continue aspirin and statin.  She is asymptomatic.  Current medicines were reviewed with the patient today.   Orders Placed  This Encounter  Procedures  . EKG 12-Lead    Disposition: Follow-up in 6 months.  Signed, Satira Sark, MD, [facility] 05/15/2018 9:45 AM    [facility] [place], [place], [place] [address] Phone: [phone]; Fax: [phone]

## 2018-05-15 ENCOUNTER — Ambulatory Visit: Payer: Medicare Other | Admitting: Cardiology

## 2018-05-15 ENCOUNTER — Encounter: Payer: Self-pay | Admitting: Cardiology

## 2018-05-15 ENCOUNTER — Encounter: Payer: Self-pay | Admitting: *Deleted

## 2018-05-15 VITALS — BP 132/60 | HR 64 | Ht 65.0 in | Wt 220.0 lb

## 2018-05-15 DIAGNOSIS — I6523 Occlusion and stenosis of bilateral carotid arteries: Secondary | ICD-10-CM | POA: Diagnosis not present

## 2018-05-15 DIAGNOSIS — I480 Paroxysmal atrial fibrillation: Secondary | ICD-10-CM | POA: Diagnosis not present

## 2018-05-15 DIAGNOSIS — R600 Localized edema: Secondary | ICD-10-CM | POA: Diagnosis not present

## 2018-05-15 DIAGNOSIS — I10 Essential (primary) hypertension: Secondary | ICD-10-CM | POA: Diagnosis not present

## 2018-05-15 MED ORDER — RIVAROXABAN 20 MG PO TABS: 20.0000 mg | ORAL_TABLET | Freq: Every day | ORAL | 0 refills | Status: DC

## 2018-05-15 MED ORDER — FUROSEMIDE 20 MG PO TABS: ORAL_TABLET | ORAL | 6 refills | Status: DC

## 2018-05-15 NOTE — Patient Instructions (Signed)
Medication Instructions:   Begin Lasix 20mg  every morning as needed for leg swelling.  Continue all other current medications.  Labwork: none  Testing/Procedures: none  Follow-Up: Your physician wants you to follow up in: 6 months.  You will receive a reminder letter in the mail one-two months in advance.  If you don't receive a letter, please call our office to schedule the follow up appointment   Any Other Special Instructions Will Be Listed Below (If Applicable).  If you need a refill on your cardiac medications before your next appointment, please call your pharmacy.

## 2018-05-23 DIAGNOSIS — M47816 Spondylosis without myelopathy or radiculopathy, lumbar region: Secondary | ICD-10-CM | POA: Diagnosis not present

## 2018-06-07 DIAGNOSIS — E039 Hypothyroidism, unspecified: Secondary | ICD-10-CM | POA: Diagnosis not present

## 2018-06-07 DIAGNOSIS — E119 Type 2 diabetes mellitus without complications: Secondary | ICD-10-CM | POA: Diagnosis not present

## 2018-06-27 DIAGNOSIS — M170 Bilateral primary osteoarthritis of knee: Secondary | ICD-10-CM | POA: Diagnosis not present

## 2018-08-03 DIAGNOSIS — I10 Essential (primary) hypertension: Secondary | ICD-10-CM | POA: Diagnosis not present

## 2018-08-03 DIAGNOSIS — I482 Chronic atrial fibrillation: Secondary | ICD-10-CM | POA: Diagnosis not present

## 2018-08-03 DIAGNOSIS — E1140 Type 2 diabetes mellitus with diabetic neuropathy, unspecified: Secondary | ICD-10-CM | POA: Diagnosis not present

## 2018-08-03 DIAGNOSIS — E039 Hypothyroidism, unspecified: Secondary | ICD-10-CM | POA: Diagnosis not present

## 2018-08-03 DIAGNOSIS — E782 Mixed hyperlipidemia: Secondary | ICD-10-CM | POA: Diagnosis not present

## 2018-08-15 ENCOUNTER — Telehealth: Payer: Self-pay | Admitting: Cardiology

## 2018-08-15 MED ORDER — RIVAROXABAN 20 MG PO TABS: 20.0000 mg | ORAL_TABLET | Freq: Every day | ORAL | 0 refills | Status: DC

## 2018-08-15 NOTE — Telephone Encounter (Signed)
Patient notified that samples are ready for pickup.  Patient states that she is in the dough-nut whole already.  Will also give her info for Medicare Low Income Subsidy.  If she does not qualify for that, might can try assistance with [employer] to get her through till the end of the year.

## 2018-08-15 NOTE — Telephone Encounter (Signed)
Asking for samples of Xarelto.     Stated she will be home until 1pm today

## 2018-08-23 DIAGNOSIS — K219 Gastro-esophageal reflux disease without esophagitis: Secondary | ICD-10-CM | POA: Diagnosis not present

## 2018-08-23 DIAGNOSIS — I10 Essential (primary) hypertension: Secondary | ICD-10-CM | POA: Diagnosis not present

## 2018-08-23 DIAGNOSIS — I482 Chronic atrial fibrillation: Secondary | ICD-10-CM | POA: Diagnosis not present

## 2018-08-25 DIAGNOSIS — M47816 Spondylosis without myelopathy or radiculopathy, lumbar region: Secondary | ICD-10-CM | POA: Diagnosis not present

## 2018-08-29 ENCOUNTER — Encounter: Payer: Self-pay | Admitting: Gastroenterology

## 2018-08-30 ENCOUNTER — Ambulatory Visit: Payer: Self-pay | Admitting: Nurse Practitioner

## 2018-09-12 DIAGNOSIS — M130 Polyarthritis, unspecified: Secondary | ICD-10-CM | POA: Diagnosis not present

## 2018-09-12 DIAGNOSIS — I482 Chronic atrial fibrillation: Secondary | ICD-10-CM | POA: Diagnosis not present

## 2018-09-12 DIAGNOSIS — I10 Essential (primary) hypertension: Secondary | ICD-10-CM | POA: Diagnosis not present

## 2018-09-12 DIAGNOSIS — K219 Gastro-esophageal reflux disease without esophagitis: Secondary | ICD-10-CM | POA: Diagnosis not present

## 2018-09-12 DIAGNOSIS — E039 Hypothyroidism, unspecified: Secondary | ICD-10-CM | POA: Diagnosis not present

## 2018-09-14 ENCOUNTER — Telehealth: Payer: Self-pay | Admitting: Cardiology

## 2018-09-14 DIAGNOSIS — I10 Essential (primary) hypertension: Secondary | ICD-10-CM | POA: Diagnosis not present

## 2018-09-14 DIAGNOSIS — E119 Type 2 diabetes mellitus without complications: Secondary | ICD-10-CM | POA: Diagnosis not present

## 2018-09-14 DIAGNOSIS — M1990 Unspecified osteoarthritis, unspecified site: Secondary | ICD-10-CM | POA: Diagnosis not present

## 2018-09-14 DIAGNOSIS — E039 Hypothyroidism, unspecified: Secondary | ICD-10-CM | POA: Diagnosis not present

## 2018-09-14 DIAGNOSIS — R079 Chest pain, unspecified: Secondary | ICD-10-CM | POA: Diagnosis not present

## 2018-09-14 DIAGNOSIS — I700 Atherosclerosis of aorta: Secondary | ICD-10-CM | POA: Diagnosis not present

## 2018-09-14 DIAGNOSIS — Z7901 Long term (current) use of anticoagulants: Secondary | ICD-10-CM | POA: Diagnosis not present

## 2018-09-14 DIAGNOSIS — R002 Palpitations: Secondary | ICD-10-CM | POA: Diagnosis not present

## 2018-09-14 DIAGNOSIS — Z9049 Acquired absence of other specified parts of digestive tract: Secondary | ICD-10-CM | POA: Diagnosis not present

## 2018-09-14 DIAGNOSIS — Z79899 Other long term (current) drug therapy: Secondary | ICD-10-CM | POA: Diagnosis not present

## 2018-09-14 DIAGNOSIS — E785 Hyperlipidemia, unspecified: Secondary | ICD-10-CM | POA: Diagnosis not present

## 2018-09-14 DIAGNOSIS — I480 Paroxysmal atrial fibrillation: Secondary | ICD-10-CM | POA: Diagnosis not present

## 2018-09-14 DIAGNOSIS — R072 Precordial pain: Secondary | ICD-10-CM | POA: Diagnosis not present

## 2018-09-14 DIAGNOSIS — I2510 Atherosclerotic heart disease of native coronary artery without angina pectoris: Secondary | ICD-10-CM | POA: Diagnosis not present

## 2018-09-14 DIAGNOSIS — R0600 Dyspnea, unspecified: Secondary | ICD-10-CM | POA: Diagnosis not present

## 2018-09-14 DIAGNOSIS — K449 Diaphragmatic hernia without obstruction or gangrene: Secondary | ICD-10-CM | POA: Diagnosis not present

## 2018-09-14 DIAGNOSIS — Z87891 Personal history of nicotine dependence: Secondary | ICD-10-CM | POA: Diagnosis not present

## 2018-09-14 DIAGNOSIS — R0789 Other chest pain: Secondary | ICD-10-CM | POA: Diagnosis not present

## 2018-09-14 DIAGNOSIS — R0602 Shortness of breath: Secondary | ICD-10-CM | POA: Diagnosis not present

## 2018-09-14 NOTE — Telephone Encounter (Signed)
Pt says she was at [facility] ED around 1am for afib and was discharged around 11am this morning - says hospital physician increased her atenolol 50 mg bid and pt did not want to increase dose without Dr McDowell's input - pt is scheduled for f/u in November and d/c summary is not available yet from [facility] - will forward to provider

## 2018-09-14 NOTE — Telephone Encounter (Signed)
Patient was discharged from [facility]  today for AFIB. She said that they advised her to been seen tomorrow, but she has appointment with [facility] in Nov that she feels will do.    She  wants to discuss the medication changes they made with Dr.  Domenic Polite before starting

## 2018-09-15 ENCOUNTER — Encounter: Payer: Self-pay | Admitting: *Deleted

## 2018-09-15 NOTE — Telephone Encounter (Signed)
Records requested

## 2018-09-15 NOTE — Telephone Encounter (Signed)
I received records from [facility] regarding recent hospital evaluation.  She was seen for rapid atrial fibrillation which is reported to have converted back to sinus rhythm following a single dose of intravenous diltiazem IV.  She was monitored in the hospital without recurrent arrhythmia and it was recommended that she increase her atenolol to 50 mg twice a day from 25 mg twice a day.  She was taken off chlorthalidone on a regular basis but continued on losartan.  Also continued on Xarelto for stroke prophylaxis.  These are reasonable recommendations, but I would suggest that she follow her blood pressure and heart rate to make sure that further adjustments are not necessary.

## 2018-09-15 NOTE — Telephone Encounter (Signed)
LM to return call.

## 2018-09-15 NOTE — Telephone Encounter (Signed)
Difficult to make any recommendations without specific information regarding what was documented at ER visit.  Please obtain records.  If she needs to be seen in the short-term, would suggest follow-up with PCP first.

## 2018-09-16 DIAGNOSIS — I480 Paroxysmal atrial fibrillation: Secondary | ICD-10-CM | POA: Diagnosis not present

## 2018-09-16 DIAGNOSIS — Z7901 Long term (current) use of anticoagulants: Secondary | ICD-10-CM | POA: Diagnosis not present

## 2018-09-18 MED ORDER — ATENOLOL 50 MG PO TABS: 50.0000 mg | ORAL_TABLET | Freq: Two times a day (BID) | ORAL | 1 refills | Status: AC

## 2018-09-18 NOTE — Telephone Encounter (Signed)
Based on these reported heart rates, would go ahead and stay on the atenolol at 50 mg twice daily as she has been doing.

## 2018-09-18 NOTE — Telephone Encounter (Signed)
Pt aware and will continue to monitor BP/HR - updated medication list

## 2018-09-18 NOTE — Telephone Encounter (Signed)
Pt says she had an appt with Dr Kari Baars (pcp) and was told to stop atenolol and start Toprol XL 50 mg daily (pt did not get the Toprol XL yet and has still been taking atenolol 50 mg bid she didn't want to change without talking to Dr Domenic Polite) also changed losartan 50 mg daily and stopped simvastatin and started pravastatin 10 mg daily - says BP this weekend has been 150s-180s/70s-80s HR 62-68

## 2018-09-18 NOTE — Addendum Note (Signed)
Addended by: Julian Hy T on: 09/18/2018 10:42 AM   Modules accepted: Orders

## 2018-09-21 DIAGNOSIS — M170 Bilateral primary osteoarthritis of knee: Secondary | ICD-10-CM | POA: Diagnosis not present

## 2018-09-25 ENCOUNTER — Telehealth: Payer: Self-pay | Admitting: Cardiology

## 2018-09-25 MED ORDER — CLONIDINE HCL 0.1 MG PO TABS: 0.1000 mg | ORAL_TABLET | ORAL | 2 refills | Status: DC | PRN

## 2018-09-25 NOTE — Telephone Encounter (Signed)
Patient notified and verbalized understanding.  Will send new prescription to [facility] for delivery.

## 2018-09-25 NOTE — Telephone Encounter (Signed)
Had cortisone injection in both knees last Thursday.  Not sure if that may have caused or if she is causing it to be elevated.  Very concerned about stroke due to history in her family.   HR - 58 this morning.    Last evening - 166/71  67 at 7:00 pm   BP check during phone call - 186/76  63    Did have headache yesterday.  No other c/o chest pain, dizziness, or sob.

## 2018-09-25 NOTE — Telephone Encounter (Signed)
Patient called stating that her BP continues to run high. States that when she got up this am it was 211/79 at 530am.

## 2018-09-25 NOTE — Telephone Encounter (Signed)
Elevations in blood pressure could be related to the recent cortisone injections.  Hopefully, this will resolve to prior baseline soon.  Her medical regimen looks good already without much option for up titration unless a new medication were added.  We could give her a prescription for clonidine 0.1 mg to be taken if blood pressure is greater than 180/90.

## 2018-10-02 ENCOUNTER — Other Ambulatory Visit: Payer: Self-pay

## 2018-10-02 NOTE — Patient Outreach (Signed)
[facility]) Care Management  10/02/2018  JESSALYN HINOJOSA 04-Nov-1946 [id number]   Medication Adherence call to Mrs. Richrd Prime left a message for patient to call back patient is due on Pravastatin 10 mg and Glipizide 5 mg. Mrs. Mello is showing past due under [place].   [place] Management Direct Dial [phone]  Fax [phone] [email or web address]

## 2018-10-03 ENCOUNTER — Telehealth: Payer: Self-pay | Admitting: Cardiology

## 2018-10-03 DIAGNOSIS — I10 Essential (primary) hypertension: Secondary | ICD-10-CM | POA: Diagnosis not present

## 2018-10-03 DIAGNOSIS — R002 Palpitations: Secondary | ICD-10-CM | POA: Diagnosis not present

## 2018-10-03 DIAGNOSIS — Z87891 Personal history of nicotine dependence: Secondary | ICD-10-CM | POA: Diagnosis not present

## 2018-10-03 DIAGNOSIS — I4891 Unspecified atrial fibrillation: Secondary | ICD-10-CM | POA: Diagnosis not present

## 2018-10-03 DIAGNOSIS — E119 Type 2 diabetes mellitus without complications: Secondary | ICD-10-CM | POA: Diagnosis not present

## 2018-10-03 DIAGNOSIS — Z79899 Other long term (current) drug therapy: Secondary | ICD-10-CM | POA: Diagnosis not present

## 2018-10-03 NOTE — Telephone Encounter (Signed)
Pt aware and voiced understanding of extra 25 mg of atenolol as needed - nurse visit scheduled for 10/17 - and will continue to monitor BP/HR and bring those readings to nurse appt

## 2018-10-03 NOTE — Telephone Encounter (Signed)
No available extender appts until 11/14 - appt with provider 11/25 - does she need a nursing visit or monitor prior to this appt?

## 2018-10-03 NOTE — Telephone Encounter (Signed)
Pt says this morning around 1am woke up with chest pain - checked BP was 180s/80s - took clonidine, laid back down - woke up at 3am and was still hurting HR was 140 and went to [facility] - says she was told EKG was normal - BP was 160/94 HR 77 - says she was given one dose of metoprolol 25 mg and was sent home within an hour and told to take her normal dose of atenolol this morning and f/u with Dr Domenic Polite - hasn't checked BP/HR since being home - denies any symptoms at this time - wanted to know if she should be seen or continue to monitor BP/HR and continue atenolol 50 mg bid and clonidine prn - pt has appt with Dr Domenic Polite in November

## 2018-10-03 NOTE — Telephone Encounter (Signed)
I wonder whether she had an episode of transient atrial fibrillation based on her heart rate at home, perhaps this spontaneously went back to sinus rhythm when she was seen in the ER based on subsequent heart rate and blood pressure.  If she is back to baseline today, would continue with current medications.  Please schedule a closer office follow-up visit if possible with APP.  If she continues to have elevated heart rates documented at home, she could consider taking an additional one half atenolol 25 mg tablet.  We may also need to consider further monitoring to assess rhythm frequency.

## 2018-10-03 NOTE — Telephone Encounter (Signed)
Yes, have her record blood pressure and heart rate daily for the next 10 days and then get a nursing visit.  If she continues to have fluctuations in heart rate or palpitations, a cardiac monitor the can then be placed.

## 2018-10-03 NOTE — Telephone Encounter (Signed)
Patient was seen at [facility] ER for elevated BP & Chest pains.  Was told to call the office and discuss her BP.

## 2018-10-05 ENCOUNTER — Telehealth: Payer: Self-pay | Admitting: *Deleted

## 2018-10-05 NOTE — Telephone Encounter (Signed)
Pt c/o headache and BP has been running 170s/80s - 180s/80s HR 60s - says she took the clonidine yesterday but it didn't bring BP down - has not taken an extra 25mg  atenolol as suggested in previous phone note - pt will take the extra atenolol and continue to monitor

## 2018-10-06 ENCOUNTER — Telehealth: Payer: Self-pay | Admitting: *Deleted

## 2018-10-06 ENCOUNTER — Encounter (HOSPITAL_COMMUNITY): Payer: Self-pay | Admitting: Emergency Medicine

## 2018-10-06 ENCOUNTER — Emergency Department (HOSPITAL_COMMUNITY): Payer: Medicare Other

## 2018-10-06 ENCOUNTER — Emergency Department (HOSPITAL_COMMUNITY)
Admission: EM | Admit: 2018-10-06 | Discharge: 2018-10-06 | Disposition: A | Discharge status: Home / Self Care | Payer: Medicare Other | Attending: Emergency Medicine | Admitting: Emergency Medicine

## 2018-10-06 ENCOUNTER — Other Ambulatory Visit: Payer: Self-pay

## 2018-10-06 DIAGNOSIS — Z87891 Personal history of nicotine dependence: Secondary | ICD-10-CM | POA: Insufficient documentation

## 2018-10-06 DIAGNOSIS — E119 Type 2 diabetes mellitus without complications: Secondary | ICD-10-CM | POA: Insufficient documentation

## 2018-10-06 DIAGNOSIS — Z7902 Long term (current) use of antithrombotics/antiplatelets: Secondary | ICD-10-CM | POA: Insufficient documentation

## 2018-10-06 DIAGNOSIS — Z79899 Other long term (current) drug therapy: Secondary | ICD-10-CM | POA: Insufficient documentation

## 2018-10-06 DIAGNOSIS — R42 Dizziness and giddiness: Secondary | ICD-10-CM | POA: Diagnosis present

## 2018-10-06 DIAGNOSIS — Z7984 Long term (current) use of oral hypoglycemic drugs: Secondary | ICD-10-CM | POA: Insufficient documentation

## 2018-10-06 DIAGNOSIS — I10 Essential (primary) hypertension: Secondary | ICD-10-CM

## 2018-10-06 LAB — COMPREHENSIVE METABOLIC PANEL
ALT: 16 U/L (ref 0–44)
AST: 14 U/L — ABNORMAL LOW (ref 15–41)
Albumin: 3.5 g/dL (ref 3.5–5.0)
Alkaline Phosphatase: 66 U/L (ref 38–126)
Anion gap: 7 (ref 5–15)
BUN: 13 mg/dL (ref 8–23)
CO2: 28 mmol/L (ref 22–32)
Calcium: 9.1 mg/dL (ref 8.9–10.3)
Chloride: 104 mmol/L (ref 98–111)
Creatinine, Ser: 0.67 mg/dL (ref 0.44–1.00)
GFR calc Af Amer: >60 mL/min (ref >60)
GFR calc non Af Amer: >60 mL/min (ref >60)
Glucose, Bld: 119 mg/dL — ABNORMAL HIGH (ref 70–99)
Potassium: 3.8 mmol/L (ref 3.5–5.1)
Sodium: 139 mmol/L (ref 135–145)
Total Bilirubin: 0.5 mg/dL (ref 0.3–1.2)
Total Protein: 6.7 g/dL (ref 6.5–8.1)

## 2018-10-06 LAB — CBC WITH DIFFERENTIAL/PLATELET
Abs Immature Granulocytes: 0.06 10*3/uL (ref 0.00–0.07)
Basophils Absolute: 0.1 10*3/uL (ref 0.0–0.1)
Basophils Relative: 0 %
Eosinophils Absolute: 0 10*3/uL (ref 0.0–0.5)
Eosinophils Relative: 0 %
HCT: 37.6 % (ref 36.0–46.0)
Hemoglobin: 10.8 g/dL — ABNORMAL LOW (ref 12.0–15.0)
Immature Granulocytes: 1 %
Lymphocytes Relative: 20 %
Lymphs Abs: 2.4 10*3/uL (ref 0.7–4.0)
MCH: 23.4 pg — ABNORMAL LOW (ref 26.0–34.0)
MCHC: 28.7 g/dL — ABNORMAL LOW (ref 30.0–36.0)
MCV: 81.4 fL (ref 80.0–100.0)
Monocytes Absolute: 0.8 10*3/uL (ref 0.1–1.0)
Monocytes Relative: 7 %
Neutro Abs: 8.7 10*3/uL — ABNORMAL HIGH (ref 1.7–7.7)
Neutrophils Relative %: 72 %
Platelets: 278 10*3/uL (ref 150–400)
RBC: 4.62 MIL/uL (ref 3.87–5.11)
RDW: 17.2 % — ABNORMAL HIGH (ref 11.5–15.5)
WBC: 12.1 10*3/uL — ABNORMAL HIGH (ref 4.0–10.5)
nRBC: 0 % (ref 0.0–0.2)

## 2018-10-06 NOTE — Discharge Instructions (Addendum)
Start taking your chlorthalidone every day.  Record your blood pressure daily in the morning.  And contact your cardiologist next week to let them know which her blood pressure is doing.  If your blood pressure continues to be high then your cardiologist wants to increase her Cozaar.

## 2018-10-06 NOTE — Telephone Encounter (Signed)
Pt called regarding BP - last night was 131/68 HR 60 after taking extra 25 mg of atenolol - woke up around 6am and BP 185/86 took 75 mg of atenolol and rechecked around 8am and BP was 178/80 HR 60 - denies any symptoms says she hasn't had a headache since yesterday morning - wanting to know if she should increase atenolol 75 mg daily and if she should take in the morning or at night

## 2018-10-06 NOTE — Telephone Encounter (Signed)
Pt voiced understanding - will continue to monitor and update [place] if BP still remains high

## 2018-10-06 NOTE — ED Notes (Signed)
Patient transported to X-ray 

## 2018-10-06 NOTE — ED Triage Notes (Signed)
Pt with hx of a fib Card has changed atenolol to 50 mg bid And added clonidine 0.1mg  to daily meds  Today was told to hold clonidine and add atenolol 25 mg   Here for BP

## 2018-10-06 NOTE — ED Provider Notes (Signed)
[facility] EMERGENCY DEPARTMENT Provider Note   CSN: [id number] Arrival date & time: 10/06/18  1421     History   Chief Complaint Chief Complaint  Patient presents with  . Hypertension    HPI Martha Torres is a 72 y.o. female.  Patient complains of feeling little dizzy and her blood pressure being elevated.  Patient called her cardiologist who now recommends increasing her diuretic so she is taking it daily.  The history is provided by the patient. No language interpreter was used.  Hypertension  This is a recurrent problem. The current episode started more than 2 days ago. The problem occurs constantly. The problem has not changed since onset.Pertinent negatives include no chest pain, no abdominal pain and no headaches. Nothing aggravates the symptoms. Nothing relieves the symptoms. She has tried nothing for the symptoms. The treatment provided no relief.    Past Medical History:  Diagnosis Date  . Carotid artery occlusion    Right CEA in 2002  . Chest pain 2006   Stress nuclear-anteroapical and basilar inferior reversible defects; cath-normal coronary arteries and EF  . Chronic low back pain    MRI 2014: Spondylolisthesis of L4-L5 with foraminal narrowing, most prominent on the right and facet arthropathy.  L4-L5 surgery planned for 04/2013 with left-sided pedicle screw fixation.  . Degenerative joint disease   . DVT (deep venous thrombosis) ([place])   . Essential hypertension   . Gastroesophageal reflux    H/o stricture & hiatal hernia  . Hyperlipidemia   . MRSA (methicillin resistant Staphylococcus aureus) June 2014  . Paroxysmal atrial fibrillation ([place]) 04/2013  . Type 2 diabetes mellitus [facility])     Patient Active Problem List   Diagnosis Date Noted  . Pain in joint, lower leg 11/06/2014  . Occlusion and stenosis of carotid artery without mention of cerebral infarction 08/16/2013  . Aftercare following surgery of the circulatory system, [place] 08/16/2013  . Chronic  low back pain   . Hypertension   . Hyperlipidemia   . Chest pain   . Tobacco abuse   . Cerebrovascular disease   . DVT (deep venous thrombosis) ([place]) 05/08/2013  . Gastroesophageal reflux disease 05/08/2013    Past Surgical History:  Procedure Laterality Date  . CAROTID ENDARTERECTOMY Right 10/25/2001   Subsequent to episode of syncope  . LAMINECTOMY  2000   L5-S1 discectomy and laminectomy; 3 other surgical procedures subsequently performed  . LAPAROSCOPIC CHOLECYSTECTOMY    . SPINE SURGERY  05-23-13   X's 5 surgeries  . VAGINAL HYSTERECTOMY       OB History   None      Home Medications    Prior to Admission medications   Medication Sig Start Date End Date Taking? Authorizing Provider  Acetaminophen (TYLENOL ARTHRITIS PAIN PO) Take 1 tablet by mouth daily.     [provider]  ALPRAZolam Duanne Moron) 0.5 MG tablet Take 0.5 mg by mouth at bedtime as needed for anxiety.    [provider]  atenolol (TENORMIN) 50 MG tablet Take 1 tablet (50 mg total) by mouth 2 (two) times daily. 09/18/18   Satira Sark, MD  chlorthalidone (HYGROTON) 25 MG tablet daily.  05/07/13   [provider]  cloNIDine (CATAPRES) 0.1 MG tablet Take 1 tablet (0.1 mg total) by mouth as needed (for blood pressure greater than 180/90). 09/25/18   Satira Sark, MD  furosemide (LASIX) 20 MG tablet Take one tab by mouth every morning as needed for leg swelling. 05/15/18  Satira Sark, MD  gabapentin (NEURONTIN) 300 MG capsule Take 300 mg by mouth every 8 (eight) hours.    [provider]  glipiZIDE (GLUCOTROL XL) 10 MG 24 hr tablet Take 1 tablet by mouth daily. 03/02/17   [provider]  levothyroxine (SYNTHROID, LEVOTHROID) 50 MCG tablet Take 50 mcg by mouth daily.    [provider]  losartan (COZAAR) 50 MG tablet Take 50 mg by mouth daily.    [provider]  meclizine (ANTIVERT) 25 MG tablet Take 25 mg 3 (three) times daily as needed by  mouth for dizziness.    [provider]  omeprazole (PRILOSEC) 20 MG capsule Take 20 mg by mouth daily.    [provider]  potassium chloride SA (K-DUR,KLOR-CON) 20 MEQ tablet Take 20 mEq 2 (two) times daily by mouth.  08/01/12   [provider]  pravastatin (PRAVACHOL) 10 MG tablet Take 10 mg by mouth daily.    [provider]  rivaroxaban (XARELTO) 20 MG TABS tablet Take 1 tablet (20 mg total) by mouth daily with supper. 08/15/18   Satira Sark, MD    Family History Family History  Problem Relation Age of Onset  . Heart attack Father        Also mother  . Cancer - Prostate Father   . Heart disease Father        Heart Disease before age 52  . Hyperlipidemia Father   . Hypertension Sister   . Hyperlipidemia Sister   . Cancer Sister   . Stroke Mother   . Heart disease Mother        Heart Disease before age 59  . Hyperlipidemia Mother   . Heart attack Mother   . Diabetes Sister   . Hyperlipidemia Sister   . Hypertension Sister   . Hyperlipidemia Brother   . Cancer Brother        Luekemia  . Hypertension Brother     Social History Social History   Tobacco Use  . Smoking status: Former Smoker    Packs/day: 0.25    Years: 43.00    Pack years: 10.75    Types: Cigarettes    Start date: 03/10/1965    Last attempt to quit: 11/06/2008    Years since quitting: 9.9  . Smokeless tobacco: Never Used  Substance Use Topics  . Alcohol use: Not Currently    Alcohol/week: 1.0 standard drinks    Types: 1 Standard drinks or equivalent per week  . Drug use: Not Currently     Allergies   Patient has no known allergies.   Review of Systems Review of Systems  Constitutional: Negative for appetite change and fatigue.  HENT: Negative for congestion, ear discharge and sinus pressure.   Eyes: Negative for discharge.  Respiratory: Negative for cough.   Cardiovascular: Negative for chest pain.  Gastrointestinal: Negative for abdominal pain and  diarrhea.  Genitourinary: Negative for frequency and hematuria.  Musculoskeletal: Negative for back pain.  Skin: Negative for rash.  Neurological: Positive for dizziness. Negative for seizures and headaches.  Psychiatric/Behavioral: Negative for hallucinations.     Physical Exam Updated Vital Signs BP (!) 193/94 (BP Location: Right Arm)   Pulse 69   Temp 98.3 F (36.8 C) (Oral)   Resp 18   Ht 5\' 5"  (1.651 m)   Wt 97.5 kg   SpO2 98%   BMI 35.78 kg/m   Physical Exam  Constitutional: She is oriented to person, place, and time. She  appears well-developed.  HENT:  Head: Normocephalic.  Eyes: Conjunctivae and EOM are normal. No scleral icterus.  Neck: Neck supple. No thyromegaly present.  Cardiovascular: Normal rate and regular rhythm. Exam reveals no gallop and no friction rub.  No murmur heard. Pulmonary/Chest: No stridor. She has no wheezes. She has no rales. She exhibits no tenderness.  Abdominal: She exhibits no distension. There is no tenderness. There is no rebound.  Musculoskeletal: Normal range of motion. She exhibits no edema.  Lymphadenopathy:    She has no cervical adenopathy.  Neurological: She is oriented to person, place, and time. She exhibits normal muscle tone. Coordination normal.  Skin: No rash noted. No erythema.  Psychiatric: She has a normal mood and affect. Her behavior is normal.     ED Treatments / Results  Labs (all labs ordered are listed, but only abnormal results are displayed) Labs Reviewed  CBC WITH DIFFERENTIAL/PLATELET - Abnormal; Notable for the following components:      Result Value   WBC 12.1 (*)    Hemoglobin 10.8 (*)    MCH 23.4 (*)    MCHC 28.7 (*)    RDW 17.2 (*)    Neutro Abs 8.7 (*)    All other components within normal limits  COMPREHENSIVE METABOLIC PANEL - Abnormal; Notable for the following components:   Glucose, Bld 119 (*)    AST 14 (*)    All other components within normal limits    EKG None  Radiology Ct  Head Wo Contrast  Result Date: 10/06/2018 CLINICAL DATA:  72 year old female with altered mental status. Intermittent headaches, progressive. Atrial fibrillation. Hypertension. EXAM: CT HEAD WITHOUT CONTRAST TECHNIQUE: Contiguous axial images were obtained from the base of the skull through the vertex without intravenous contrast. COMPARISON:  [facility] head CT without contrast 11/28/2017 and earlier. FINDINGS: Brain: Normal cerebral volume. Cavum septum pellucidum, normal variant. No midline shift, ventriculomegaly, mass effect, evidence of mass lesion, intracranial hemorrhage or evidence of cortically based acute infarction. Gray-white matter differentiation is within normal limits throughout the brain. Vascular: Calcified atherosclerosis at the skull base. No suspicious intracranial vascular hyperdensity. Skull: Negative. Sinuses/Orbits: Visualized paranasal sinuses and mastoids are stable and well pneumatized. Other: Visualized orbits and scalp soft tissues are within normal limits. IMPRESSION: Stable and normal for age noncontrast Head CT. Electronically Signed   By: Genevie Ann M.D.   On: 10/06/2018 16:04    Procedures Procedures (including critical care time)  Medications Ordered in ED Medications - No data to display   Initial Impression / Assessment and Plan / ED Course  I have reviewed the triage vital signs and the nursing notes.  Pertinent labs & imaging results that were available during my care of the patient were reviewed by me and considered in my medical decision making (see chart for details).     Unremarkable.  CT scan shows no acute changes.  Patient has been instructed to take her chlorthalidone daily and record her blood pressures and contact her doctor next week  Final Clinical Impressions(s) / ED Diagnoses   Final diagnoses:  Essential hypertension    ED Discharge Orders    None       Milton Ferguson, MD 10/06/18 [facility]

## 2018-10-06 NOTE — Telephone Encounter (Signed)
Please verify for me all the doses of her antihypertensive medications. When I look at our current list, it's not clear that things are the same as reported.

## 2018-10-06 NOTE — Telephone Encounter (Signed)
I would suggest that she start taking the chlorthalidone 25 mg each day, not so much for leg swelling, but blood pressure control.  Cozaar could also be further increased as a next option.  It seems that her heart rate has not been elevated significantly, so I would continue the atenolol at 50 mg twice daily for now.  Actually, the next step would be increasing her Cozaar.

## 2018-10-06 NOTE — Telephone Encounter (Signed)
Pt confirmed she is taking losartan 50 mg daily, atenolol 50 mg bid - extra 25 mg prn - clonidine for BP > 180/90 - chlorthalidone 25 mg prn for swelling/BP (says she hasn't taken this in a month)

## 2018-10-09 ENCOUNTER — Telehealth: Payer: Self-pay | Admitting: *Deleted

## 2018-10-09 NOTE — Telephone Encounter (Signed)
Pt says she went to [facility] ED on Friday evening after she was getting her hair done and says her face turned blood red and she started to tingle all over - pt had not taken chlorthalidone that day said she had planned to take it Friday evening - ED provider told pt to contact us and continue to take chlorthalidone daily - says she has took it daily since Saturday - today at 7am BP was 169/80 HR 61 8am 147/79 HR 61 - denies any symptoms - is scheduled to come in for nursing appt on Thursday - wanted to know if she should change any meds since BP has come down some

## 2018-10-09 NOTE — Telephone Encounter (Signed)
Continue with same for now pending nurse blood pressure check.

## 2018-10-09 NOTE — Telephone Encounter (Signed)
Pt aware.

## 2018-10-12 ENCOUNTER — Ambulatory Visit (INDEPENDENT_AMBULATORY_CARE_PROVIDER_SITE_OTHER): Payer: Medicare Other | Admitting: *Deleted

## 2018-10-12 DIAGNOSIS — I480 Paroxysmal atrial fibrillation: Secondary | ICD-10-CM

## 2018-10-12 DIAGNOSIS — I10 Essential (primary) hypertension: Secondary | ICD-10-CM | POA: Diagnosis not present

## 2018-10-12 MED ORDER — RIVAROXABAN 20 MG PO TABS: 20.0000 mg | ORAL_TABLET | Freq: Every day | ORAL | 0 refills | Status: DC

## 2018-10-12 NOTE — Progress Notes (Signed)
Pt here for EKG/BP check per recent phone note - pt brought in medications she has been taking losartan 50 daily, chlorthalidone 25 mg daily, and atenolol 50 mg bid - BP at home 10/12 - 126/71 HR 65 10/13 - 172/79 HR 63 - 10/13 - 119/66 HR 66 - pt c/o leg cramps started 3 days ago and keeps her up at night, says she takes tonic water and this helps some - denies any other symptoms - BP today by nurse check is 120/62 HR 68 - says she has taken all morning medications - EKG done - also checked pt BP machine against manual check and was comparable at 110/62

## 2018-10-15 NOTE — Progress Notes (Signed)
ECG reviewed - sinus rhythm with LVH. Would continue with current medications.

## 2018-10-20 NOTE — Progress Notes (Signed)
LM to return call if needed

## 2018-10-23 ENCOUNTER — Telehealth: Payer: Self-pay | Admitting: Cardiology

## 2018-10-23 NOTE — Telephone Encounter (Signed)
Female at pt's home asks we call tomorrow 10/24/18 between 8 and 9 am

## 2018-10-23 NOTE — Telephone Encounter (Signed)
Patient states her BP has come down some however she is still feeling tired all the time.  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

## 2018-10-26 ENCOUNTER — Telehealth: Payer: Self-pay | Admitting: Cardiology

## 2018-10-26 NOTE — Telephone Encounter (Signed)
Yes

## 2018-10-26 NOTE — Telephone Encounter (Signed)
Left message to return call 

## 2018-10-26 NOTE — Telephone Encounter (Signed)
Can she take this medication  Levocetirizine 5mg  1 tab at bedtime for allergies

## 2018-10-26 NOTE — Telephone Encounter (Signed)
Antihistamines are typically fine, but will forward to provider for his calrification.

## 2018-10-30 NOTE — Telephone Encounter (Signed)
Patient states this issue has been resolved.

## 2018-10-30 NOTE — Telephone Encounter (Signed)
Patient notified and verbalized understanding. 

## 2018-10-31 ENCOUNTER — Other Ambulatory Visit: Payer: Self-pay

## 2018-10-31 DIAGNOSIS — I6523 Occlusion and stenosis of bilateral carotid arteries: Secondary | ICD-10-CM

## 2018-11-06 DIAGNOSIS — E782 Mixed hyperlipidemia: Secondary | ICD-10-CM | POA: Diagnosis not present

## 2018-11-06 DIAGNOSIS — I10 Essential (primary) hypertension: Secondary | ICD-10-CM | POA: Diagnosis not present

## 2018-11-06 DIAGNOSIS — E1140 Type 2 diabetes mellitus with diabetic neuropathy, unspecified: Secondary | ICD-10-CM | POA: Diagnosis not present

## 2018-11-06 DIAGNOSIS — E039 Hypothyroidism, unspecified: Secondary | ICD-10-CM | POA: Diagnosis not present

## 2018-11-14 DIAGNOSIS — E039 Hypothyroidism, unspecified: Secondary | ICD-10-CM | POA: Diagnosis not present

## 2018-11-14 DIAGNOSIS — E1140 Type 2 diabetes mellitus with diabetic neuropathy, unspecified: Secondary | ICD-10-CM | POA: Diagnosis not present

## 2018-11-14 DIAGNOSIS — Z23 Encounter for immunization: Secondary | ICD-10-CM | POA: Diagnosis not present

## 2018-11-14 DIAGNOSIS — Z0000 Encounter for general adult medical examination without abnormal findings: Secondary | ICD-10-CM | POA: Diagnosis not present

## 2018-11-14 DIAGNOSIS — I10 Essential (primary) hypertension: Secondary | ICD-10-CM | POA: Diagnosis not present

## 2018-11-15 ENCOUNTER — Encounter (HOSPITAL_COMMUNITY): Payer: Self-pay

## 2018-11-15 ENCOUNTER — Ambulatory Visit: Payer: Self-pay | Admitting: Vascular Surgery

## 2018-11-17 NOTE — Progress Notes (Signed)
Cardiology Office Note  Date: 11/20/2018   ID: Baileigh, Modisette Nov 22, 1946, MRN [id number]  PCP: Celene Squibb, MD  Primary Cardiologist: Rozann Lesches, MD   Chief Complaint  Patient presents with  . Atrial Fibrillation    History of Present Illness: Martha Torres is a 72 y.o. female last seen in May.  She is here for a routine follow-up visit.  She does not report any significant palpitations or chest pain.  Currently with URI symptoms.  She recently saw Dr. Nevada Crane for follow-up.  I reviewed interval lab work from October as outlined below.  She continues on Xarelto 20 mg daily without bleeding problems.  Also atenolol at 50 mg twice daily.  Blood pressure is well controlled today.  She is on Cozaar, chlorthalidone, and potassium supplements.  ECG from May is noted below.  Past Medical History:  Diagnosis Date  . Carotid artery occlusion    Right CEA in 2002  . Chest pain 2006   Stress nuclear-anteroapical and basilar inferior reversible defects; cath-normal coronary arteries and EF  . Chronic low back pain    MRI 2014: Spondylolisthesis of L4-L5 with foraminal narrowing, most prominent on the right and facet arthropathy.  L4-L5 surgery planned for 04/2013 with left-sided pedicle screw fixation.  . Degenerative joint disease   . DVT (deep venous thrombosis) ([place])   . Essential hypertension   . Gastroesophageal reflux    H/o stricture & hiatal hernia  . Hyperlipidemia   . MRSA (methicillin resistant Staphylococcus aureus) June 2014  . Paroxysmal atrial fibrillation ([place]) 04/2013  . Type 2 diabetes mellitus ([place])     Past Surgical History:  Procedure Laterality Date  . CAROTID ENDARTERECTOMY Right 10/25/2001   Subsequent to episode of syncope  . LAMINECTOMY  2000   L5-S1 discectomy and laminectomy; 3 other surgical procedures subsequently performed  . LAPAROSCOPIC CHOLECYSTECTOMY    . SPINE SURGERY  05-23-13   X's 5 surgeries  . VAGINAL HYSTERECTOMY       Current Outpatient Medications  Medication Sig Dispense Refill  . Acetaminophen (TYLENOL ARTHRITIS PAIN PO) Take 1 tablet by mouth daily.     Marland Kitchen ALPRAZolam (XANAX) 0.5 MG tablet Take 0.5 mg by mouth at bedtime as needed for anxiety.    Marland Kitchen atenolol (TENORMIN) 50 MG tablet Take 1 tablet (50 mg total) by mouth 2 (two) times daily. 180 tablet 1  . chlorthalidone (HYGROTON) 25 MG tablet daily.     Marland Kitchen gabapentin (NEURONTIN) 300 MG capsule Take 300 mg by mouth every 8 (eight) hours.    Marland Kitchen glipiZIDE (GLUCOTROL XL) 10 MG 24 hr tablet Take 1 tablet by mouth daily.    Marland Kitchen levothyroxine (SYNTHROID, LEVOTHROID) 50 MCG tablet Take 50 mcg by mouth daily.    Marland Kitchen losartan (COZAAR) 50 MG tablet Take 50 mg by mouth daily.    . meclizine (ANTIVERT) 25 MG tablet Take 25 mg 3 (three) times daily as needed by mouth for dizziness.    Marland Kitchen omeprazole (PRILOSEC) 20 MG capsule Take 20 mg by mouth daily.    . potassium chloride SA (K-DUR,KLOR-CON) 20 MEQ tablet Take 20 mEq 2 (two) times daily by mouth.     . pravastatin (PRAVACHOL) 10 MG tablet Take 10 mg by mouth daily.    . rivaroxaban (XARELTO) 20 MG TABS tablet Take 1 tablet (20 mg total) by mouth daily with supper. 21 tablet 0   No current facility-administered medications for this visit.    Allergies:  Patient has no known allergies.   Social History: The patient  reports that she quit smoking about 10 years ago. Her smoking use included cigarettes. She started smoking about 53 years ago. She has a 10.75 pack-year smoking history. She has never used smokeless tobacco. She reports that she drank about 1.0 standard drinks of alcohol per week. She reports that she has current or past drug history.   ROS:  Please see the history of present illness. Otherwise, complete review of systems is positive for recent cough and chest congestion, no fevers or chills.  All other systems are reviewed and negative.   Physical Exam: VS:  BP 128/64   Pulse 66   Ht 5\' 5"  (1.651 m)   Wt  218 lb (98.9 kg)   SpO2 95%   BMI 36.28 kg/m , BMI Body mass index is 36.28 kg/m.  Wt Readings from Last 3 Encounters:  11/20/18 218 lb (98.9 kg)  10/06/18 215 lb (97.5 kg)  05/15/18 220 lb (99.8 kg)    General: Patient appears comfortable at rest. HEENT: Conjunctiva and lids normal, oropharynx clear. Neck: Supple, no elevated JVP or carotid bruits, no thyromegaly. Lungs: Clear to auscultation, nonlabored breathing at rest. Cardiac: Regular rate and rhythm, no S3, soft systolic murmur. Abdomen: Soft, nontender, bowel sounds present. Extremities: Mild ankle edema, distal pulses 2+. Skin: Warm and dry. Musculoskeletal: No kyphosis. Neuropsychiatric: Alert and oriented x3, affect grossly appropriate.  ECG: I personally reviewed the tracing from 05/15/2018 which showed sinus rhythm with nonspecific T wave changes.  Recent Labwork: 10/06/2018: ALT 16; AST 14; BUN 13; Creatinine, Ser 0.67; Hemoglobin 10.8; Platelets 278; Potassium 3.8; Sodium 139   Other Studies Reviewed Today:  Carotid Dopplers 11/09/2017: Patent RICA CEA site with 1-39% stenosis, 69-48% LICA stenosis.   Assessment and Plan:  1.  Paroxysmal atrial fibrillation, CHADSVASC score is 5.  She is doing well with no significant palpitations on atenolol, remains on Xarelto for stroke prophylaxis.  Recent lab work reviewed above.  2.  Essential hypertension, blood pressures well controlled today.  No changes made to present regimen.  3.  Mild to moderate carotid artery disease, asymptomatic.  Continues on statin therapy.  Not on aspirin with concurrent use of Xarelto.  4.  Intermittent leg edema, controlled on current regimen.  Current medicines were reviewed with the patient today.  Disposition: Follow-up in 6 months.  Signed, Satira Sark, MD, [facility] 11/20/2018 8:55 AM    [facility] [place], [place], [place] [address] Phone: [phone]; Fax: [phone]

## 2018-11-20 ENCOUNTER — Encounter: Payer: Self-pay | Admitting: *Deleted

## 2018-11-20 ENCOUNTER — Ambulatory Visit: Payer: Medicare Other | Admitting: Cardiology

## 2018-11-20 ENCOUNTER — Encounter: Payer: Self-pay | Admitting: Cardiology

## 2018-11-20 VITALS — BP 128/64 | HR 66 | Ht 65.0 in | Wt 218.0 lb

## 2018-11-20 DIAGNOSIS — I6523 Occlusion and stenosis of bilateral carotid arteries: Secondary | ICD-10-CM

## 2018-11-20 DIAGNOSIS — I480 Paroxysmal atrial fibrillation: Secondary | ICD-10-CM

## 2018-11-20 DIAGNOSIS — I10 Essential (primary) hypertension: Secondary | ICD-10-CM | POA: Diagnosis not present

## 2018-11-20 DIAGNOSIS — R600 Localized edema: Secondary | ICD-10-CM | POA: Diagnosis not present

## 2018-11-20 MED ORDER — RIVAROXABAN 20 MG PO TABS: 20.0000 mg | ORAL_TABLET | Freq: Every day | ORAL | 0 refills | Status: DC

## 2018-11-20 NOTE — Patient Instructions (Addendum)

## 2018-12-05 ENCOUNTER — Telehealth: Payer: Self-pay

## 2018-12-05 ENCOUNTER — Encounter: Payer: Self-pay | Admitting: Nurse Practitioner

## 2018-12-05 ENCOUNTER — Ambulatory Visit: Payer: Medicare Other | Admitting: Nurse Practitioner

## 2018-12-05 ENCOUNTER — Other Ambulatory Visit: Payer: Self-pay

## 2018-12-05 VITALS — BP 152/66 | HR 68 | Temp 97.1°F | Ht 65.0 in | Wt 220.4 lb

## 2018-12-05 DIAGNOSIS — R197 Diarrhea, unspecified: Secondary | ICD-10-CM

## 2018-12-05 DIAGNOSIS — R109 Unspecified abdominal pain: Secondary | ICD-10-CM

## 2018-12-05 DIAGNOSIS — K219 Gastro-esophageal reflux disease without esophagitis: Secondary | ICD-10-CM | POA: Diagnosis not present

## 2018-12-05 MED ORDER — DICYCLOMINE HCL 10 MG PO CAPS: 10.0000 mg | ORAL_CAPSULE | Freq: Four times a day (QID) | ORAL | 2 refills | Status: DC | PRN

## 2018-12-05 MED ORDER — NA SULFATE-K SULFATE-MG SULF 17.5-3.13-1.6 GM/177ML PO SOLN: 1.0000 | ORAL | 0 refills | Status: DC

## 2018-12-05 NOTE — Progress Notes (Addendum)
REVIEWED-NO ADDITIONAL RECOMMENDATIONS. Primary Care Physician:  Celene Squibb, MD Primary Gastroenterologist:  Dr. Oneida Alar  Chief Complaint  Patient presents with  . Diarrhea    since started dm meds    HPI:   Martha Torres is a 72 y.o. female who presents on referral from primary care for diarrhea.  Reviewed information provided with referral including office visit dated 08/23/2018.  This is a chronic care management visit.  At that time the patient described chronic diarrhea, on probiotic which does not help.  Recommended referral to GI.  Reviewed labs which found CBC with low hemoglobin at 10.9, low normal MCV at 79, low MCHC at 29.5.  Query element of iron deficiency anemia.  CMP essentially normal.  No history of colonoscopy in our system.  Today she states her diarrhea started when she was started on metformin. Since that time this was changed to Glipizide, however her diarrhea persists. She has also had a dose adjustment with no improvement. She is not on a probiotic. No illness prior to diarrhea, no recent travel. She was in the hospital (ED) 10/06/18 for AFib; the week before she was in the ER at [facility]. Her diarrhea started about 2 years ago. Typically has 7-8 stools a day. Has stomach aching and followed quickly/urgently by diarrhea. Her aching/pain improves after a bowel movement. Stools are sometimes like a regular bowel movement, sometimes like loose/watery. Denies hard candies and gum. Eats cheese once in a while, but not much else in dairy. Decreased appetite. Denies hematochezia, melena, fever, chills, unintentional weight loss. Her last colonoscopy was more than 10 years ago at [facility]. Has a history of PUD, on Prilosec, which manages her symptoms well. Has intermittent chest tightness, PCP aware and working up. Denies dyspnea, dizziness, lightheadedness, syncope, near syncope. Denies any other upper or lower GI symptoms.  On Xarelto for AFib. No history of CVA or PE. Hasn't  had TSH checked at her last visit.  Past Medical History:  Diagnosis Date  . Carotid artery occlusion    Right CEA in 2002  . Chest pain 2006   Stress nuclear-anteroapical and basilar inferior reversible defects; cath-normal coronary arteries and EF  . Chronic low back pain    MRI 2014: Spondylolisthesis of L4-L5 with foraminal narrowing, most prominent on the right and facet arthropathy.  L4-L5 surgery planned for 04/2013 with left-sided pedicle screw fixation.  . Degenerative joint disease   . DVT (deep venous thrombosis) ([place])   . Essential hypertension   . Gastroesophageal reflux    H/o stricture & hiatal hernia  . Hyperlipidemia   . MRSA (methicillin resistant Staphylococcus aureus) June 2014  . Paroxysmal atrial fibrillation ([place]) 04/2013  . Type 2 diabetes mellitus ([place])     Past Surgical History:  Procedure Laterality Date  . CAROTID ENDARTERECTOMY Right 10/25/2001   Subsequent to episode of syncope  . LAMINECTOMY  2000   L5-S1 discectomy and laminectomy; 3 other surgical procedures subsequently performed  . LAPAROSCOPIC CHOLECYSTECTOMY    . SPINE SURGERY  05-23-13   X's 5 surgeries  . VAGINAL HYSTERECTOMY      Current Outpatient Medications  Medication Sig Dispense Refill  . Acetaminophen (TYLENOL ARTHRITIS PAIN PO) Take 1 tablet by mouth as needed.     . ALPRAZolam (XANAX) 0.5 MG tablet Take 0.5 mg by mouth at bedtime as needed for anxiety.    Marland Kitchen atenolol (TENORMIN) 50 MG tablet Take 1 tablet (50 mg total) by mouth 2 (two) times daily. [place]  tablet 1  . chlorthalidone (HYGROTON) 25 MG tablet daily.     Marland Kitchen gabapentin (NEURONTIN) 300 MG capsule Take 300 mg by mouth 2 (two) times daily.     Marland Kitchen glipiZIDE (GLUCOTROL) 5 MG tablet Take by mouth 2 (two) times daily before a meal.    . levothyroxine (SYNTHROID, LEVOTHROID) 50 MCG tablet Take 50 mcg by mouth daily.    Marland Kitchen losartan (COZAAR) 50 MG tablet Take 50 mg by mouth daily.    . meclizine (ANTIVERT) 25 MG tablet Take 25 mg 3  (three) times daily as needed by mouth for dizziness.    Marland Kitchen omeprazole (PRILOSEC) 20 MG capsule Take 20 mg by mouth daily.    . potassium chloride SA (K-DUR,KLOR-CON) 20 MEQ tablet Take 20 mEq 2 (two) times daily by mouth.     . pravastatin (PRAVACHOL) 10 MG tablet Take 10 mg by mouth daily.    . rivaroxaban (XARELTO) 20 MG TABS tablet Take 1 tablet (20 mg total) by mouth daily with supper. 21 tablet 0   No current facility-administered medications for this visit.     Allergies as of 12/05/2018  . (No Known Allergies)    Family History  Problem Relation Age of Onset  . Heart attack Father        Also mother  . Cancer - Prostate Father   . Heart disease Father        Heart Disease before age 50  . Hyperlipidemia Father   . Hypertension Sister   . Hyperlipidemia Sister   . Cancer Sister   . Stroke Mother   . Heart disease Mother        Heart Disease before age 42  . Hyperlipidemia Mother   . Heart attack Mother   . Diabetes Sister   . Hyperlipidemia Sister   . Hypertension Sister   . Hyperlipidemia Brother   . Cancer Brother        Luekemia  . Hypertension Brother   . Colon cancer Neg Hx     Social History   Socioeconomic History  . Marital status: Widowed    Spouse name: Not on file  . Number of children: 3  . Years of education: Not on file  . Highest education level: Not on file  Occupational History  . Occupation: Software engineer    Comment: After [employer]  Social Needs  . Financial resource strain: Not on file  . Food insecurity:    Worry: Not on file    Inability: Not on file  . Transportation needs:    Medical: Not on file    Non-medical: Not on file  Tobacco Use  . Smoking status: Former Smoker    Packs/day: 0.25    Years: 43.00    Pack years: 10.75    Types: Cigarettes    Start date: 03/10/1965    Last attempt to quit: 11/06/2008    Years since quitting: 10.0  . Smokeless tobacco: Never Used  Substance and Sexual  Activity  . Alcohol use: Not Currently    Alcohol/week: 1.0 standard drinks    Types: 1 Standard drinks or equivalent per week  . Drug use: Not Currently  . Sexual activity: Not on file  Lifestyle  . Physical activity:    Days per week: Not on file    Minutes per session: Not on file  . Stress: Not on file  Relationships  . Social connections:    Talks on phone: Not on file  Gets together: Not on file    Attends religious service: Not on file    Active member of club or organization: Not on file    Attends meetings of clubs or organizations: Not on file    Relationship status: Not on file  . Intimate partner violence:    Fear of current or ex partner: Not on file    Emotionally abused: Not on file    Physically abused: Not on file    Forced sexual activity: Not on file  Other Topics Concern  . Not on file  Social History Narrative  . Not on file    Review of Systems: Complete ROS negative except as per HPI.    Physical Exam: BP (!) 152/66   Pulse 68   Temp (!) 97.1 F (36.2 C) (Oral)   Ht 5\' 5"  (1.651 m)   Wt 220 lb 6.4 oz (100 kg)   BMI 36.68 kg/m  General:   Alert and oriented. Pleasant and cooperative. Well-nourished and well-developed.  Head:  Normocephalic and atraumatic. Eyes:  Without icterus, sclera clear and conjunctiva pink.  Ears:  Normal auditory acuity. Cardiovascular:  S1, S2 present without murmurs appreciated. Extremities without clubbing or edema. Respiratory:  Clear to auscultation bilaterally. No wheezes, rales, or rhonchi. No distress.  Gastrointestinal:  +BS, soft, non-tender and non-distended. No HSM noted. No guarding or rebound. No masses appreciated.  Rectal:  Deferred  Musculoskalatal:  Symmetrical without gross deformities. Neurologic:  Alert and oriented x4;  grossly normal neurologically. Psych:  Alert and cooperative. Normal mood and affect. Heme/Lymph/Immune: No excessive bruising noted.    12/05/2018 8:33 AM   Disclaimer:  This note was dictated with voice recognition software. Similar sounding words can inadvertently be transcribed and may not be corrected upon review.

## 2018-12-05 NOTE — Telephone Encounter (Signed)
I have faxed a request of HOLDING XARELTO 48 hours prior to procedure to Dr. Nevada Crane.

## 2018-12-05 NOTE — Patient Instructions (Signed)
EG advised for pt to be given Phenergan 12.5mg  IV prior to TCS w/SLF. EG also advised for pt to hold Glipizide evening before and morning of TCS (noted on pt's instructions).

## 2018-12-05 NOTE — Patient Instructions (Signed)
1. I have sent a prescription for Bentyl 10 mg to your pharmacy.  You can take this up to 4 times a day, as needed for diarrhea.  You can try taking it before going out for a meal to see if this helps prevent diarrhea while you were eating out with friends. 2. Have your labs drawn when you are able to. 3. We will schedule your colonoscopy for you. 4. Further recommendations will follow your results and your colonoscopy. 5. Return for follow-up in 3 months. 6. Call us if you have any questions or concerns.  At [facility] we value your feedback. You may receive a survey about your visit today. Please share your experience as we strive to create trusting relationships with our patients to provide genuine, compassionate, quality care.  We appreciate your understanding and patience as we review any laboratory studies, imaging, and other diagnostic tests that are ordered as we care for you. Our office policy is 5 business days for review of these results, and any emergent or urgent results are addressed in a timely manner for your best interest. If you do not hear from our office in 1 week, please contact us.   We also encourage the use of MyChart, which contains your medical information for your review as well. If you are not enrolled in this feature, an access code is on this after visit summary for your convenience. Thank you for allowing [place] to be involved in your care.  It was great to see you today!  I hope you have a Merry Christmas!!

## 2018-12-08 NOTE — Assessment & Plan Note (Signed)
Chronic diarrhea for over 2 years.  7 8 stools a day, sometimes soft, sometimes watery diarrhea.  Abdominal pain associated with her diarrhea that resolves after defecation.  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  At this point I will trial her on Bentyl 10 mg daily.  Doubt overt infection without persistent, profuse watery diarrhea.  I also check a TSH and celiac panel.  Minimal dairy intake, no artificial sweeteners with gum and hard candies.  I will also start her on a probiotic.  Return for follow-up in 3 months.  As an aside she is also due for colonoscopy and we will plan for this to check for other etiologies such as eosinophilic colitis as well as to accomplish surveillance.  We will have to contact her PCP with the okay to hold Xarelto for 48 hours prior.  Proceed with TCS with Dr. Oneida Alar in near future: the risks, benefits, and alternatives have been discussed with the patient in detail. The patient states understanding and desires to proceed.  The patient is currently on Neurontin twice daily.  The patient is not on any other anticoagulants, anxiolytics, chronic pain medications, or antidepressants.  Conscious sedation should be adequate for her procedure.

## 2018-12-08 NOTE — Assessment & Plan Note (Signed)
History of GERD and peptic ulcer disease currently on Prilosec which manages her symptoms well.  Recommend she continue PPI.  Return for follow-up as needed.

## 2018-12-08 NOTE — Assessment & Plan Note (Signed)
The patient has abdominal cramping/pain preceding diarrhea.  Pain resolves after diarrhea which is diagnostic/reminiscent of IBS diarrhea type.  Further management as per above.  Colonoscopy as per above.  Follow-up in 3 months.

## 2018-12-11 NOTE — Progress Notes (Signed)
cc'd to pcp 

## 2018-12-12 NOTE — Telephone Encounter (Signed)
I faxed a 2nd request to Dr. Nevada Crane.

## 2018-12-13 NOTE — Telephone Encounter (Signed)
I have also mailed patient new instructions

## 2018-12-13 NOTE — Telephone Encounter (Signed)
LMTCB for pt 

## 2018-12-13 NOTE — Telephone Encounter (Signed)
LMTCB-instructions mailed.

## 2018-12-13 NOTE — Telephone Encounter (Signed)
Noted  

## 2018-12-13 NOTE — Telephone Encounter (Addendum)
I have just received the fax from Dr. Nevada Crane. OK to HOLD the Xarelto for 2 days prior to procedure and resume ASAP after procedure. Paperwork on [employer].  Forwarding to [facility] to schedule.

## 2018-12-14 DIAGNOSIS — R109 Unspecified abdominal pain: Secondary | ICD-10-CM | POA: Diagnosis not present

## 2018-12-14 DIAGNOSIS — R197 Diarrhea, unspecified: Secondary | ICD-10-CM | POA: Diagnosis not present

## 2018-12-14 DIAGNOSIS — K219 Gastro-esophageal reflux disease without esophagitis: Secondary | ICD-10-CM | POA: Diagnosis not present

## 2018-12-14 NOTE — Telephone Encounter (Signed)
PT called and is aware Dr. Nevada Crane said OK to HOLD Xarelto 48 hours prior to procedure. She will be expecting the instructions in the mail.

## 2018-12-15 LAB — CELIAC DISEASE COMPREHENSIVE PANEL WITH REFLEXES
(TTG) AB, IGA: 1 U/mL
Immunoglobulin A: 182 mg/dL (ref 70–320)

## 2018-12-15 LAB — TSH: TSH: 0.97 mIU/L (ref 0.40–4.50)

## 2018-12-18 NOTE — Progress Notes (Signed)
Pt is aware. Forwarding to Manuela Schwartz to send to PCP.

## 2018-12-18 NOTE — Progress Notes (Signed)
CC'D TO PCP °

## 2018-12-29 DIAGNOSIS — M170 Bilateral primary osteoarthritis of knee: Secondary | ICD-10-CM | POA: Diagnosis not present

## 2019-01-03 ENCOUNTER — Ambulatory Visit: Payer: Self-pay | Admitting: Vascular Surgery

## 2019-01-03 ENCOUNTER — Encounter (HOSPITAL_COMMUNITY): Payer: Self-pay

## 2019-01-03 ENCOUNTER — Ambulatory Visit (HOSPITAL_COMMUNITY)
Admission: RE | Admit: 2019-01-03 | Discharge: 2019-01-03 | Disposition: A | Discharge status: Home / Self Care | Payer: Medicare Other | Source: Ambulatory Visit | Attending: Vascular Surgery | Admitting: Vascular Surgery

## 2019-01-03 ENCOUNTER — Encounter: Payer: Self-pay | Admitting: Vascular Surgery

## 2019-01-03 ENCOUNTER — Other Ambulatory Visit: Payer: Self-pay

## 2019-01-03 ENCOUNTER — Ambulatory Visit: Payer: Medicare Other | Admitting: Vascular Surgery

## 2019-01-03 VITALS — BP 155/78 | HR 57 | Temp 97.2°F | Resp 20 | Ht 65.0 in | Wt 220.0 lb

## 2019-01-03 DIAGNOSIS — I6523 Occlusion and stenosis of bilateral carotid arteries: Secondary | ICD-10-CM

## 2019-01-03 NOTE — Progress Notes (Signed)
Patient name: Martha Torres MRN: [id number] DOB: 1946/04/14 Sex: female  REASON FOR VISIT:   Follow-up of carotid disease.  HPI:   Martha Torres is a pleasant 73 y.o. female who underwent a right carotid endarterectomy by Dr. Amedeo Plenty in 2002.  I last saw the patient on 11/09/2017.  At that time the right carotid endarterectomy site was widely patent.  There was a 40 to 59% left carotid stenosis.  She was asymptomatic.  She was on Xarelto so did not take aspirin.  She was on a statin.  She comes in for a one-year follow-up visit.  Since I saw her last, she denies any history of stroke, TIAs, expressive or receptive aphasia, or amaurosis fugax.  She is not on aspirin because she is on Xarelto.  She does take a statin.  She has had some problems with dizziness lately and also significant knee pain bilaterally which is more significant on the right side.  She has arthritis in both knees and is followed by orthopedics.  She has been to the emergency department several times with poorly controlled blood pressure.  She is on Xarelto for atrial fibrillation.  Past Medical History:  Diagnosis Date  . Carotid artery occlusion    Right CEA in 2002  . Chest pain 2006   Stress nuclear-anteroapical and basilar inferior reversible defects; cath-normal coronary arteries and EF  . Chronic low back pain    MRI 2014: Spondylolisthesis of L4-L5 with foraminal narrowing, most prominent on the right and facet arthropathy.  L4-L5 surgery planned for 04/2013 with left-sided pedicle screw fixation.  . Degenerative joint disease   . DVT (deep venous thrombosis) ([place])   . Essential hypertension   . Gastroesophageal reflux    H/o stricture & hiatal hernia  . Hyperlipidemia   . MRSA (methicillin resistant Staphylococcus aureus) June 2014  . Paroxysmal atrial fibrillation ([place]) 04/2013  . Type 2 diabetes mellitus ([facility])     Family History  Problem Relation Age of Onset  . Heart attack Father        Also mother   . Cancer - Prostate Father   . Heart disease Father        Heart Disease before age 66  . Hyperlipidemia Father   . Hypertension Sister   . Hyperlipidemia Sister   . Cancer Sister   . Stroke Mother   . Heart disease Mother        Heart Disease before age 36  . Hyperlipidemia Mother   . Heart attack Mother   . Diabetes Sister   . Hyperlipidemia Sister   . Hypertension Sister   . Hyperlipidemia Brother   . Cancer Brother        Luekemia  . Hypertension Brother   . Colon cancer Neg Hx     SOCIAL HISTORY: Social History   Tobacco Use  . Smoking status: Former Smoker    Packs/day: 0.25    Years: 43.00    Pack years: 10.75    Types: Cigarettes    Start date: 03/10/1965    Last attempt to quit: 11/06/2008    Years since quitting: 10.1  . Smokeless tobacco: Never Used  Substance Use Topics  . Alcohol use: Not Currently    Alcohol/week: 1.0 standard drinks    Types: 1 Standard drinks or equivalent per week    No Known Allergies  Current Outpatient Medications  Medication Sig Dispense Refill  . Acetaminophen (TYLENOL ARTHRITIS PAIN PO) Take 1 tablet by mouth  as needed.     . ALPRAZolam (XANAX) 0.5 MG tablet Take 0.5 mg by mouth at bedtime as needed for anxiety.    Marland Kitchen atenolol (TENORMIN) 50 MG tablet Take 1 tablet (50 mg total) by mouth 2 (two) times daily. 180 tablet 1  . chlorthalidone (HYGROTON) 25 MG tablet daily.     Marland Kitchen dicyclomine (BENTYL) 10 MG capsule Take 1 capsule (10 mg total) by mouth 4 (four) times daily as needed (diarrhea, abdominal discomfort). 90 capsule 2  . gabapentin (NEURONTIN) 300 MG capsule Take 300 mg by mouth 2 (two) times daily.     Marland Kitchen glipiZIDE (GLUCOTROL) 5 MG tablet Take by mouth 2 (two) times daily before a meal.    . levothyroxine (SYNTHROID, LEVOTHROID) 50 MCG tablet Take 50 mcg by mouth daily.    Marland Kitchen losartan (COZAAR) 50 MG tablet Take 50 mg by mouth daily.    . meclizine (ANTIVERT) 25 MG tablet Take 25 mg 3 (three) times daily as needed by  mouth for dizziness.    Marland Kitchen omeprazole (PRILOSEC) 20 MG capsule Take 20 mg by mouth daily.    . potassium chloride SA (K-DUR,KLOR-CON) 20 MEQ tablet Take 20 mEq 2 (two) times daily by mouth.     . pravastatin (PRAVACHOL) 10 MG tablet Take 10 mg by mouth daily.    . rivaroxaban (XARELTO) 20 MG TABS tablet Take 1 tablet (20 mg total) by mouth daily with supper. 21 tablet 0  . Na Sulfate-K Sulfate-Mg Sulf (SUPREP BOWEL PREP KIT) 17.5-3.13-1.6 GM/177ML SOLN Take 1 kit by mouth as directed. (Patient not taking: Reported on 01/03/2019) 1 Bottle 0   No current facility-administered medications for this visit.     REVIEW OF SYSTEMS:  [X]  denotes positive finding, [ ]  denotes negative finding Cardiac  Comments:  Chest pain or chest pressure:    Shortness of breath upon exertion:    Short of breath when lying flat:    Irregular heart rhythm:        Vascular    Pain in calf, thigh, or hip brought on by ambulation:    Pain in feet at night that wakes you up from your sleep:     Blood clot in your veins:    Leg swelling:         Pulmonary    Oxygen at home:    Productive cough:     Wheezing:         Neurologic    Sudden weakness in arms or legs:     Sudden numbness in arms or legs:     Sudden onset of difficulty speaking or slurred speech:    Temporary loss of vision in one eye:     Problems with dizziness:  x       Gastrointestinal    Blood in stool:     Vomited blood:         Genitourinary    Burning when urinating:     Blood in urine:        Psychiatric    Major depression:         Hematologic    Bleeding problems:    Problems with blood clotting too easily:        Skin    Rashes or ulcers:        Constitutional    Fever or chills:     PHYSICAL EXAM:   Vitals:   01/03/19 0907 01/03/19 0910  BP: (!) 156/85 (!) 155/78  Pulse: Marland Kitchen)  57   Resp: 20   Temp: (!) 97.2 F (36.2 C)   SpO2: 98%   Weight: 220 lb (99.8 kg)   Height: 5' 5"  (1.651 m)     GENERAL: The patient  is a well-nourished female, in no acute distress. The vital signs are documented above. CARDIAC: There is a regular rate and rhythm.  VASCULAR: I do not detect carotid bruits. I cannot palpate pedal pulses however both feet are warm and well-perfused. PULMONARY: There is good air exchange bilaterally without wheezing or rales. ABDOMEN: Soft and non-tender with normal pitched bowel sounds.  MUSCULOSKELETAL: There are no major deformities or cyanosis. NEUROLOGIC: No focal weakness or paresthesias are detected. SKIN: There are no ulcers or rashes noted. PSYCHIATRIC: The patient has a normal affect.  DATA:    CAROTID DUPLEX: I have independently interpreted her carotid duplex scan today.  On the right side she has a recurrent 60 to 79% carotid stenosis probably in the lower end of that range.  The right vertebral artery is patent with antegrade flow.  On the left side there is a 40 to 59% stenosis which is stable.  The left vertebral artery is patent with antegrade flow.  MEDICAL ISSUES:   BILATERAL CAROTID DISEASE: This patient has developed a recurrent right carotid stenosis in the 60 to 79% range.  The 40 to 59% carotid stenosis on the left is stable.  She is asymptomatic.  She is on a statin.  Today her blood pressure was fine but she states in general her blood pressure has not been under good control and this is followed closely by cardiology.  Given the recurrent right carotid stenosis I have recommended a follow-up duplex scan in 6 months.  I will see her back at that time.  She knows to call sooner if she has problems.  DIZZINESS: I do not think that her dizziness can be attributed to vertebrobasilar insufficiency given that both vertebral arteries are patent with antegrade flow.  Deitra Mayo [facility] [phone]

## 2019-01-18 DIAGNOSIS — I739 Peripheral vascular disease, unspecified: Secondary | ICD-10-CM | POA: Diagnosis not present

## 2019-01-18 DIAGNOSIS — M47816 Spondylosis without myelopathy or radiculopathy, lumbar region: Secondary | ICD-10-CM | POA: Diagnosis not present

## 2019-02-13 DIAGNOSIS — Z7901 Long term (current) use of anticoagulants: Secondary | ICD-10-CM | POA: Diagnosis not present

## 2019-02-13 DIAGNOSIS — I10 Essential (primary) hypertension: Secondary | ICD-10-CM | POA: Diagnosis not present

## 2019-02-13 DIAGNOSIS — Z8669 Personal history of other diseases of the nervous system and sense organs: Secondary | ICD-10-CM | POA: Diagnosis not present

## 2019-02-13 DIAGNOSIS — R42 Dizziness and giddiness: Secondary | ICD-10-CM | POA: Diagnosis not present

## 2019-02-13 DIAGNOSIS — E119 Type 2 diabetes mellitus without complications: Secondary | ICD-10-CM | POA: Diagnosis not present

## 2019-02-13 DIAGNOSIS — G629 Polyneuropathy, unspecified: Secondary | ICD-10-CM | POA: Diagnosis not present

## 2019-02-16 ENCOUNTER — Telehealth: Payer: Self-pay | Admitting: Cardiology

## 2019-02-16 NOTE — Telephone Encounter (Signed)
patient stated she was returning Staci's call. Thought that it would be about her upcoming colonoscopy on March 2.  She has to be instructed on when to stop her Louanna Raw

## 2019-02-16 NOTE — Telephone Encounter (Signed)
Patient called back and said that it was Colletta Maryland that left her a message. Patient advised that she was being contacted to schedule her follow up appointment to see Domenic Polite in May. Appointment scheduled.

## 2019-02-17 DIAGNOSIS — R079 Chest pain, unspecified: Secondary | ICD-10-CM | POA: Diagnosis not present

## 2019-02-17 DIAGNOSIS — I4891 Unspecified atrial fibrillation: Secondary | ICD-10-CM | POA: Diagnosis not present

## 2019-02-17 DIAGNOSIS — R002 Palpitations: Secondary | ICD-10-CM | POA: Diagnosis not present

## 2019-02-17 DIAGNOSIS — I498 Other specified cardiac arrhythmias: Secondary | ICD-10-CM | POA: Diagnosis not present

## 2019-02-17 DIAGNOSIS — R062 Wheezing: Secondary | ICD-10-CM | POA: Diagnosis not present

## 2019-02-17 DIAGNOSIS — Z7984 Long term (current) use of oral hypoglycemic drugs: Secondary | ICD-10-CM | POA: Diagnosis not present

## 2019-02-17 DIAGNOSIS — Z79899 Other long term (current) drug therapy: Secondary | ICD-10-CM | POA: Diagnosis not present

## 2019-02-17 DIAGNOSIS — E039 Hypothyroidism, unspecified: Secondary | ICD-10-CM | POA: Diagnosis not present

## 2019-02-18 DIAGNOSIS — I498 Other specified cardiac arrhythmias: Secondary | ICD-10-CM | POA: Diagnosis not present

## 2019-02-19 ENCOUNTER — Telehealth: Payer: Self-pay | Admitting: Cardiology

## 2019-02-19 ENCOUNTER — Encounter: Payer: Self-pay | Admitting: *Deleted

## 2019-02-19 NOTE — Telephone Encounter (Signed)
Nothing received from [facility] yet.    Left message to return call.

## 2019-02-19 NOTE — Telephone Encounter (Signed)
Patient was in [facility] over the weekend for AFIB   Was told to contact our office to be seen.  I offered several appointments but she declined due to other obligations with doctor appointments.   She asked that a nurse call her instead of being seen due to not being able to take appointment dates that we had

## 2019-02-19 NOTE — Telephone Encounter (Signed)
Notes requested from [facility].

## 2019-02-20 NOTE — Telephone Encounter (Signed)
Patient returned call to [facility]. Informed patient that we are still waiting on records from [facility] .

## 2019-02-21 DIAGNOSIS — E782 Mixed hyperlipidemia: Secondary | ICD-10-CM | POA: Diagnosis not present

## 2019-02-21 DIAGNOSIS — E1140 Type 2 diabetes mellitus with diabetic neuropathy, unspecified: Secondary | ICD-10-CM | POA: Diagnosis not present

## 2019-02-21 DIAGNOSIS — I480 Paroxysmal atrial fibrillation: Secondary | ICD-10-CM | POA: Diagnosis not present

## 2019-02-21 DIAGNOSIS — I10 Essential (primary) hypertension: Secondary | ICD-10-CM | POA: Diagnosis not present

## 2019-02-21 DIAGNOSIS — E039 Hypothyroidism, unspecified: Secondary | ICD-10-CM | POA: Diagnosis not present

## 2019-02-23 NOTE — Telephone Encounter (Signed)
Left detailed message on voice mail - received call from [facility] today after second attempt to request notes.  They stated they did not have records for her / she had not been there.  Asked for patient to return call on Monday morning.

## 2019-02-26 ENCOUNTER — Other Ambulatory Visit (HOSPITAL_BASED_OUTPATIENT_CLINIC_OR_DEPARTMENT_OTHER): Payer: Self-pay

## 2019-02-26 ENCOUNTER — Ambulatory Visit (HOSPITAL_COMMUNITY)
Admission: RE | Admit: 2019-02-26 | Discharge: 2019-02-26 | Disposition: A | Discharge status: Home / Self Care | Payer: Medicare Other | Attending: Gastroenterology | Admitting: Gastroenterology

## 2019-02-26 ENCOUNTER — Encounter (HOSPITAL_COMMUNITY): Payer: Self-pay

## 2019-02-26 ENCOUNTER — Other Ambulatory Visit: Payer: Self-pay

## 2019-02-26 ENCOUNTER — Encounter (HOSPITAL_COMMUNITY)
Admission: RE | Disposition: A | Discharge status: Home / Self Care | Payer: Self-pay | Source: Home / Self Care | Attending: Gastroenterology

## 2019-02-26 DIAGNOSIS — Z7984 Long term (current) use of oral hypoglycemic drugs: Secondary | ICD-10-CM | POA: Diagnosis not present

## 2019-02-26 DIAGNOSIS — K529 Noninfective gastroenteritis and colitis, unspecified: Secondary | ICD-10-CM | POA: Diagnosis not present

## 2019-02-26 DIAGNOSIS — K5730 Diverticulosis of large intestine without perforation or abscess without bleeding: Secondary | ICD-10-CM | POA: Insufficient documentation

## 2019-02-26 DIAGNOSIS — R197 Diarrhea, unspecified: Secondary | ICD-10-CM | POA: Diagnosis not present

## 2019-02-26 DIAGNOSIS — M545 Low back pain: Secondary | ICD-10-CM | POA: Diagnosis not present

## 2019-02-26 DIAGNOSIS — Z79890 Hormone replacement therapy (postmenopausal): Secondary | ICD-10-CM | POA: Diagnosis not present

## 2019-02-26 DIAGNOSIS — Z8249 Family history of ischemic heart disease and other diseases of the circulatory system: Secondary | ICD-10-CM | POA: Insufficient documentation

## 2019-02-26 DIAGNOSIS — Z86718 Personal history of other venous thrombosis and embolism: Secondary | ICD-10-CM | POA: Insufficient documentation

## 2019-02-26 DIAGNOSIS — K219 Gastro-esophageal reflux disease without esophagitis: Secondary | ICD-10-CM | POA: Diagnosis not present

## 2019-02-26 DIAGNOSIS — K648 Other hemorrhoids: Secondary | ICD-10-CM

## 2019-02-26 DIAGNOSIS — E785 Hyperlipidemia, unspecified: Secondary | ICD-10-CM | POA: Diagnosis not present

## 2019-02-26 DIAGNOSIS — Z7901 Long term (current) use of anticoagulants: Secondary | ICD-10-CM | POA: Insufficient documentation

## 2019-02-26 DIAGNOSIS — G8929 Other chronic pain: Secondary | ICD-10-CM | POA: Diagnosis not present

## 2019-02-26 DIAGNOSIS — I4891 Unspecified atrial fibrillation: Secondary | ICD-10-CM

## 2019-02-26 DIAGNOSIS — Z8614 Personal history of Methicillin resistant Staphylococcus aureus infection: Secondary | ICD-10-CM | POA: Insufficient documentation

## 2019-02-26 DIAGNOSIS — Z79899 Other long term (current) drug therapy: Secondary | ICD-10-CM | POA: Insufficient documentation

## 2019-02-26 DIAGNOSIS — Z87891 Personal history of nicotine dependence: Secondary | ICD-10-CM | POA: Diagnosis not present

## 2019-02-26 DIAGNOSIS — K644 Residual hemorrhoidal skin tags: Secondary | ICD-10-CM | POA: Insufficient documentation

## 2019-02-26 DIAGNOSIS — I10 Essential (primary) hypertension: Secondary | ICD-10-CM | POA: Insufficient documentation

## 2019-02-26 DIAGNOSIS — Q438 Other specified congenital malformations of intestine: Secondary | ICD-10-CM | POA: Insufficient documentation

## 2019-02-26 DIAGNOSIS — E119 Type 2 diabetes mellitus without complications: Secondary | ICD-10-CM | POA: Diagnosis not present

## 2019-02-26 DIAGNOSIS — I480 Paroxysmal atrial fibrillation: Secondary | ICD-10-CM | POA: Insufficient documentation

## 2019-02-26 DIAGNOSIS — R109 Unspecified abdominal pain: Secondary | ICD-10-CM

## 2019-02-26 DIAGNOSIS — G4710 Hypersomnia, unspecified: Secondary | ICD-10-CM

## 2019-02-26 HISTORY — PX: COLONOSCOPY: SHX5424

## 2019-02-26 HISTORY — PX: BIOPSY: SHX5522

## 2019-02-26 LAB — GLUCOSE, CAPILLARY: Glucose-Capillary: 146 mg/dL — ABNORMAL HIGH (ref 70–99)

## 2019-02-26 SURGERY — COLONOSCOPY
Anesthesia: Moderate Sedation

## 2019-02-26 MED ORDER — PROMETHAZINE HCL 25 MG/ML IJ SOLN: 12.5000 mg | Freq: Once | INTRAMUSCULAR | Status: DC

## 2019-02-26 MED ORDER — MEPERIDINE HCL 100 MG/ML IJ SOLN
INTRAMUSCULAR | Status: DC | PRN
  Administered 2019-02-26 (×2): 25 mg via INTRAVENOUS

## 2019-02-26 MED ORDER — MEPERIDINE HCL 100 MG/ML IJ SOLN
INTRAMUSCULAR | Status: AC
  Filled 2019-02-26: qty 2

## 2019-02-26 MED ORDER — SODIUM CHLORIDE 0.9 % IV SOLN
INTRAVENOUS | Status: DC
  Administered 2019-02-26: 08:00:00 via INTRAVENOUS

## 2019-02-26 MED ORDER — STERILE WATER FOR IRRIGATION IR SOLN
Status: DC | PRN
  Administered 2019-02-26: 100 mL

## 2019-02-26 MED ORDER — PROMETHAZINE HCL 25 MG/ML IJ SOLN
INTRAMUSCULAR | Status: AC
  Administered 2019-02-26: 12.5 mg
  Filled 2019-02-26: qty 1

## 2019-02-26 MED ORDER — SODIUM CHLORIDE 0.9% FLUSH
INTRAVENOUS | Status: AC
  Filled 2019-02-26: qty 10

## 2019-02-26 MED ORDER — MIDAZOLAM HCL 5 MG/5ML IJ SOLN
INTRAMUSCULAR | Status: DC | PRN
  Administered 2019-02-26: 2 mg via INTRAVENOUS
  Administered 2019-02-26 (×2): 1 mg via INTRAVENOUS

## 2019-02-26 MED ORDER — MIDAZOLAM HCL 5 MG/5ML IJ SOLN
INTRAMUSCULAR | Status: AC
  Filled 2019-02-26: qty 10

## 2019-02-26 NOTE — Telephone Encounter (Signed)
Finally received notes from [facility] - they have her listed as Martha Torres.  Will place notes on provider desk for his review.  Patient aware.    Also, wanted to let doctor know that her pmd felt like she needed sleep study & wanted Dr. Domenic Polite opinion on this.  They have scheduled her for 03/16/19.  Next OV with provider in [place] is June.

## 2019-02-26 NOTE — H&P (Signed)
Primary Care Physician:  Celene Squibb, MD Primary Gastroenterologist:  Dr. Oneida Alar  Pre-Procedure History & Physical: HPI:  Martha Torres is a 73 y.o. female here for  [place].  Past Medical History:  Diagnosis Date  . Carotid artery occlusion    Right CEA in 2002  . Chest pain 2006   Stress nuclear-anteroapical and basilar inferior reversible defects; cath-normal coronary arteries and EF  . Chronic low back pain    MRI 2014: Spondylolisthesis of L4-L5 with foraminal narrowing, most prominent on the right and facet arthropathy.  L4-L5 surgery planned for 04/2013 with left-sided pedicle screw fixation.  . Degenerative joint disease   . DVT (deep venous thrombosis) ([place])   . Essential hypertension   . Gastroesophageal reflux    H/o stricture & hiatal hernia  . Hyperlipidemia   . MRSA (methicillin resistant Staphylococcus aureus) June 2014  . Paroxysmal atrial fibrillation ([place]) 04/2013  . Type 2 diabetes mellitus ([place])     Past Surgical History:  Procedure Laterality Date  . CAROTID ENDARTERECTOMY Right 10/25/2001   Subsequent to episode of syncope  . LAMINECTOMY  2000   L5-S1 discectomy and laminectomy; 3 other surgical procedures subsequently performed  . LAPAROSCOPIC CHOLECYSTECTOMY    . SPINE SURGERY  05-23-13   X's 5 surgeries  . VAGINAL HYSTERECTOMY      Prior to Admission medications   Medication Sig Start Date End Date Taking? Authorizing Provider  acetaminophen (TYLENOL) 650 MG CR tablet Take 650 mg by mouth See admin instructions. Take 650 mg twice daily, may take a 3rd 650 mg dose midday as needed for pain   Yes [provider]  ALPRAZolam (XANAX) 0.5 MG tablet Take 0.5 mg by mouth at bedtime.    Yes [provider]  atenolol (TENORMIN) 50 MG tablet Take 1 tablet (50 mg total) by mouth 2 (two) times daily. 09/18/18  Yes Satira Sark, MD  Carboxymethylcellul-Glycerin (LUBRICATING EYE DROPS OP) Place 2 drops into both eyes daily  as needed (dry eyes).   Yes [provider]  chlorthalidone (HYGROTON) 25 MG tablet Take 25 mg by mouth daily.  05/07/13  Yes [provider]  dicyclomine (BENTYL) 10 MG capsule Take 1 capsule (10 mg total) by mouth 4 (four) times daily as needed (diarrhea, abdominal discomfort). 12/05/18  Yes Carlis Stable, NP  gabapentin (NEURONTIN) 300 MG capsule Take 300 mg by mouth 2 (two) times daily.    Yes [provider]  glipiZIDE (GLUCOTROL XL) 5 MG 24 hr tablet Take 5 mg by mouth 2 (two) times daily.   Yes [provider]  levothyroxine (SYNTHROID, LEVOTHROID) 50 MCG tablet Take 50 mcg by mouth daily.   Yes [provider]  losartan (COZAAR) 50 MG tablet Take 50 mg by mouth daily.   Yes [provider]  Magnesium 250 MG TABS Take 250 mg by mouth daily.   Yes [provider]  meclizine (ANTIVERT) 25 MG tablet Take 25 mg 3 (three) times daily as needed by mouth for dizziness.   Yes [provider]  Na Sulfate-K Sulfate-Mg Sulf (SUPREP BOWEL PREP KIT) 17.5-3.13-1.6 GM/177ML SOLN Take 1 kit by mouth as directed. 12/05/18  Yes Ajamu Maxon L, MD  omeprazole (PRILOSEC) 20 MG capsule Take 20 mg by mouth daily.   Yes [provider]  potassium chloride SA (K-DUR,KLOR-CON) 20 MEQ tablet Take 20 mEq 2 (two) times daily by mouth.  08/01/12  Yes [provider]  pravastatin (PRAVACHOL) 20  MG tablet Take 20 mg by mouth at bedtime.   Yes [provider]  rivaroxaban (XARELTO) 20 MG TABS tablet Take 1 tablet (20 mg total) by mouth daily with supper. Patient taking differently: Take 20 mg by mouth at bedtime.  11/20/18  Yes Satira Sark, MD  trolamine salicylate (ASPERCREME) 10 % cream Apply 1 application topically 2 (two) times daily.   Yes [provider]    Allergies as of 12/05/2018  . (No Known Allergies)    Family History  Problem Relation Age of Onset  . Heart attack Father        Also mother  .  Cancer - Prostate Father   . Heart disease Father        Heart Disease before age 60  . Hyperlipidemia Father   . Hypertension Sister   . Hyperlipidemia Sister   . Cancer Sister   . Stroke Mother   . Heart disease Mother        Heart Disease before age 51  . Hyperlipidemia Mother   . Heart attack Mother   . Diabetes Sister   . Hyperlipidemia Sister   . Hypertension Sister   . Hyperlipidemia Brother   . Cancer Brother        Luekemia  . Hypertension Brother   . Colon cancer Neg Hx     Social History   Socioeconomic History  . Marital status: Widowed    Spouse name: Not on file  . Number of children: 3  . Years of education: Not on file  . Highest education level: Not on file  Occupational History  . Occupation: Software engineer    Comment: After [employer]  Social Needs  . Financial resource strain: Not on file  . Food insecurity:    Worry: Not on file    Inability: Not on file  . Transportation needs:    Medical: Not on file    Non-medical: Not on file  Tobacco Use  . Smoking status: Former Smoker    Packs/day: 0.25    Years: 43.00    Pack years: 10.75    Types: Cigarettes    Start date: 03/10/1965    Last attempt to quit: 11/06/2008    Years since quitting: 10.3  . Smokeless tobacco: Never Used  Substance and Sexual Activity  . Alcohol use: Never    Alcohol/week: 1.0 standard drinks    Types: 1 Standard drinks or equivalent per week    Frequency: Never  . Drug use: Never  . Sexual activity: Not on file  Lifestyle  . Physical activity:    Days per week: Not on file    Minutes per session: Not on file  . Stress: Not on file  Relationships  . Social connections:    Talks on phone: Not on file    Gets together: Not on file    Attends religious service: Not on file    Active member of club or organization: Not on file    Attends meetings of clubs or organizations: Not on file    Relationship status: Not on file  . Intimate  partner violence:    Fear of current or ex partner: Not on file    Emotionally abused: Not on file    Physically abused: Not on file    Forced sexual activity: Not on file  Other Topics Concern  . Not on file  Social History Narrative  . Not on file    Review  of Systems: See HPI, otherwise negative ROS   Physical Exam: BP (!) 143/70   Pulse 70   Temp 98.7 F (37.1 C) (Oral)   Resp 15   Ht 5' 5"  (1.651 m)   Wt 98 kg   SpO2 96%   BMI 35.94 kg/m  General:   Alert,  pleasant and cooperative in NAD Head:  Normocephalic and atraumatic. Neck:  Supple; Lungs:  Clear throughout to auscultation.    Heart:  Regular rate and rhythm. Abdomen:  Soft, nontender and nondistended. Normal bowel sounds, without guarding, and without rebound.   Neurologic:  Alert and  oriented x4;  grossly normal neurologically.  Impression/Plan:     Diarrhea  PLAN: TCS TODAY WITH BIOPSY. DISCUSSED PROCEDURE, BENEFITS, & RISKS: < 1% chance of medication reaction, bleeding, perforation, or rupture of spleen/liver.

## 2019-02-26 NOTE — Op Note (Signed)
[facility] Patient Name: Martha Torres Procedure Date: 02/26/2019 7:42 AM MRN: [id number] Date of Birth: 1946/05/17 Attending MD: Barney Drain MD, MD CSN: [id number] Age: 73 Admit Type: Outpatient Procedure:                Colonoscopy WITH COLD FORCEPS BIOPSY Indications:              Chronic diarrhea Providers:                Barney Drain MD, MD, Rosina Lowenstein, RN, Aram Candela Referring MD:             Edwinna Areola. [place] Crane MD Medicines:                Meperidine 50 mg IV, Midazolam 4 mg IV Complications:            No immediate complications. Estimated Blood Loss:     Estimated blood loss was minimal. Procedure:                Pre-Anesthesia Assessment:                           - Prior to the procedure, a History and Physical                            was performed, and patient medications and                            allergies were reviewed. The patient's tolerance of                            previous anesthesia was also reviewed. The risks                            and benefits of the procedure and the sedation                            options and risks were discussed with the patient.                            All questions were answered, and informed consent                            was obtained. Prior Anticoagulants: The patient has                            taken Xarelto (rivaroxaban), last dose was 2 days                            prior to procedure. ASA Grade Assessment: II - A                            patient with mild systemic disease. After reviewing                            the risks and benefits, the patient was deemed in  satisfactory condition to undergo the procedure.                            After obtaining informed consent, the colonoscope                            was passed under direct vision. Throughout the                            procedure, the patient's blood pressure, pulse, and                            oxygen  saturations were monitored continuously. The                            PCF-[id number] ([id number]) was introduced through the                            anus and advanced to the 10 cm into the ileum. The                            colonoscopy was somewhat difficult due to a                            tortuous colon. Successful completion of the                            procedure was aided by straightening and shortening                            the scope to obtain bowel loop reduction and                            COLOWRAP. The patient tolerated the procedure                            fairly well. The quality of the bowel preparation                            was good. The terminal ileum, ileocecal valve,                            appendiceal orifice, and rectum were photographed. Scope In: 9:02:04 AM Scope Out: 9:18:45 AM Scope Withdrawal Time: 0 hours 13 minutes 8 seconds  Total Procedure Duration: 0 hours 16 minutes 41 seconds  Findings:      The terminal ileum appeared normal.      The recto-sigmoid colon and sigmoid colon were mildly tortuous. Biopsies       for histology were taken with a cold forceps from the entire colon for       evaluation of microscopic colitis.      External and internal hemorrhoids were found.      Multiple small and large-mouthed diverticula were found in the       recto-sigmoid colon, sigmoid  colon, descending colon and transverse       colon. Impression:               - NO SOURCE FOR DIARRHEA IDENTIFIED                           - Tortuous LEFT colon. Biopsied.                           - External and internal hemorrhoids.                           - MODERATE Diverticulosis in the recto-sigmoid                            colon, in the sigmoid colon, in the descending                            colon and in the transverse colon. Moderate Sedation:      Moderate (conscious) sedation was administered by the endoscopy nurse       and supervised by the  endoscopist. The following parameters were       monitored: oxygen saturation, heart rate, blood pressure, and response       to care. Total physician intraservice time was 28 minutes. Recommendation:           - Patient has a contact number available for                            emergencies. The signs and symptoms of potential                            delayed complications were discussed with the                            patient. Return to normal activities tomorrow.                            Written discharge instructions were provided to the                            patient.                           - Lactose free diet. ADD LACTASE IF SHE MUST HAVE                            CHEESE.                           - Continue present medications.                           - Await pathology results.                           - Repeat colonoscopy is not recommended due to  current age (59 years or older) for surveillance.                           - Return to GI office in 4 months. Procedure Code(s):        --- Professional ---                           [phone], Colonoscopy, flexible; with biopsy, single                            or multiple                           99153, Moderate sedation; each additional 15                            minutes intraservice time                           G0500, Moderate sedation services provided by the                            same physician or other qualified health care                            professional performing a gastrointestinal                            endoscopic service that sedation supports,                            requiring the presence of an independent trained                            observer to assist in the monitoring of the                            patient's level of consciousness and physiological                            status; initial 15 minutes of intra-service time;                             patient age 41 years or older (additional time may                            be reported with [phone], as appropriate) Diagnosis Code(s):        --- Professional ---                           K64.8, Other hemorrhoids                           K52.9, Noninfective gastroenteritis and colitis,  unspecified                           K57.30, Diverticulosis of large intestine without                            perforation or abscess without bleeding                           Q43.8, Other specified congenital malformations of                            intestine CPT copyright 2018 [employer]. All rights reserved. The codes documented in this report are preliminary and upon coder review may  be revised to meet current compliance requirements. Barney Drain, MD Barney Drain MD, MD 02/26/2019 71:24:58 AM This report has been signed electronically. Number of Addenda: 0

## 2019-02-26 NOTE — Discharge Instructions (Signed)
THE LAST PART OF YOUR SMALL BOWEL IS NORMAL. YOUR LOOSE STOOLS ARE MOST LIKELY DUE TO DAIRY(LACTOSE INTOLERANCE). You have  Internal & EXTERNAL hemorrhoids and diverticulosis IN YOUR RIGHT AND LEFT COLON. I BIOPSIED YOUR COLON.   RE-START XARELTO TODAY.  DRINK WATER TO KEEP YOUR URINE LIGHT YELLOW.  FOLLOW A LACTOSE DIET FOR ONE MONTH. SEE INFO BELOW. IF YOU MUST HAVE CHEESE, ADD LACTASE 3 PILLS WITH MEALS UP TO THREE TIMES A DAY.   USE PREPARATION H FOUR TIMES  A DAY IF NEEDED TO RELIEVE RECTAL PAIN/PRESSURE/BLEEDING.  YOUR BIOPSY RESULTS WILL BE BACK IN 5 BUSINESS DAYS.   FOLLOW UP IN 4 MOS.   We do not routinely screen for polyps after the age of 19.   Colonoscopy Care After Read the instructions outlined below and refer to this sheet in the next week. These discharge instructions provide you with general information on caring for yourself after you leave the hospital. While your treatment has been planned according to the most current medical practices available, unavoidable complications occasionally occur. If you have any problems or questions after discharge, call DR. FIELDS, [phone].  ACTIVITY  You may resume your regular activity, but move at a slower pace for the next 24 hours.   Take frequent rest periods for the next 24 hours.   Walking will help get rid of the air and reduce the bloated feeling in your belly (abdomen).   No driving for 24 hours (because of the medicine (anesthesia) used during the test).   You may shower.   Do not sign any important legal documents or operate any machinery for 24 hours (because of the anesthesia used during the test).    NUTRITION  Drink plenty of fluids.   You may resume your normal diet as instructed by your doctor.   Begin with a light meal and progress to your normal diet. Heavy or fried foods are harder to digest and may make you feel sick to your stomach (nauseated).   Avoid alcoholic beverages for 24 hours or as  instructed.    MEDICATIONS  You may resume your normal medications.   WHAT YOU CAN EXPECT TODAY  Some feelings of bloating in the abdomen.   Passage of more gas than usual.   Spotting of blood in your stool or on the toilet paper  .  IF YOU HAD POLYPS REMOVED DURING THE COLONOSCOPY:  Eat a soft diet IF YOU HAVE NAUSEA, BLOATING, ABDOMINAL PAIN, OR VOMITING.    FINDING OUT THE RESULTS OF YOUR TEST Not all test results are available during your visit. DR. Oneida Alar WILL CALL YOU WITHIN 7 DAYS OF YOUR PROCEDUE WITH YOUR RESULTS. Do not assume everything is normal if you have not heard from DR. FIELDS IN ONE WEEK, CALL HER OFFICE AT [phone].  SEEK IMMEDIATE MEDICAL ATTENTION AND CALL THE OFFICE: [phone] IF:  You have more than a spotting of blood in your stool.   Your belly is swollen (abdominal distention).   You are nauseated or vomiting.   You have a temperature over 101F.   You have abdominal pain or discomfort that is severe or gets worse throughout the day.  Lactose Free Diet Lactose is a carbohydrate that is found mainly in milk and milk products, as well as in foods with added milk or whey. Lactose must be digested by the enzyme in order to be used by the body. Lactose intolerance occurs when there is a shortage of lactase. When your body  is not able to digest lactose, you may feel sick to your stomach (nausea), bloating, cramping, gas and diarrhea.  There are many dairy products that may be tolerated better than milk by some people:  The use of cultured dairy products such as yogurt, buttermilk, cottage cheese, and sweet acidophilus milk (Kefir) for lactase-deficient individuals is usually well tolerated. This is because the healthy bacteria help digest lactose.   Lactose-hydrolyzed milk (Lactaid) contains 40-90% less lactose than milk and may also be well tolerated.    SPECIAL NOTES  Lactose is a carbohydrates. The major food source is dairy products.  Reading food labels is important. Many products contain lactose even when they are not made from milk. Look for the following words: whey, milk solids, dry milk solids, nonfat dry milk powder. Typical sources of lactose other than dairy products include breads, candies, cold cuts, prepared and processed foods, and commercial sauces and gravies.   All foods must be prepared without milk, cream, or other dairy foods.   Soy milk and lactose-free supplements (LACTASE) may be used as an alternative to milk.   FOOD GROUP ALLOWED/RECOMMENDED AVOID/USE SPARINGLY  BREADS / STARCHES 4 servings or more* Breads and rolls made without milk. [place], [place], or [place] bread. Breads and rolls that contain milk. Prepared mixes such as muffins, biscuits, waffles, pancakes. Sweet rolls, donuts, [place] toast (if made with milk or lactose).  Crackers: Soda crackers, graham crackers. Any crackers prepared without lactose. Zwieback crackers, corn curls, or any that contain lactose.  Cereals: Cooked or dry cereals prepared without lactose (read labels). Cooked or dry cereals prepared with lactose (read labels). Total, Cocoa Krispies. Special K.  Potatoes / Pasta / Rice: Any prepared without milk or lactose. Popcorn. Instant potatoes, frozen [place] fries, scalloped or au gratin potatoes.  VEGETABLES 2 servings or more Fresh, frozen, and canned vegetables. Creamed or breaded vegetables. Vegetables in a cheese sauce or with lactose-containing margarines.  FRUIT 2 servings or more All fresh, canned, or frozen fruits that are not processed with lactose. Any canned or frozen fruits processed with lactose.  MEAT & SUBSTITUTES 2 servings or more (4 to 6 oz. total per day) Plain beef, chicken, fish, [place], lamb, veal, pork, or ham. Kosher prepared meat products. Strained or junior meats that do not contain milk. Eggs, soy meat substitutes, nuts. Scrambled eggs, omelets, and souffles that contain milk. Creamed or breaded meat,  fish, or fowl. Sausage products such as wieners, liver sausage, or cold cuts that contain milk solids. Cheese, cottage cheese, or cheese spreads.  MILK None. (See BEVERAGES for milk substitutes. See DESSERTS for ice cream and frozen desserts.) Milk (whole, 2%, skim, or chocolate). Evaporated, powdered, or condensed milk; malted milk.  SOUPS & COMBINATION FOODS Bouillon, broth, vegetable soups, clear soups, consomms. Homemade soups made with allowed ingredients. Combination or prepared foods that do not contain milk or milk products (read labels). Cream soups, chowders, commercially prepared soups containing lactose. Macaroni and cheese, pizza. Combination or prepared foods that contain milk or milk products.  DESSERTS & SWEETS In moderation Water and fruit ices; gelatin; angel food cake. Homemade cookies, pies, or cakes made from allowed ingredients. Pudding (if made with water or a milk substitute). Lactose-free tofu desserts. Sugar, honey, corn syrup, jam, jelly; marmalade; molasses (beet sugar); Pure sugar candy; marshmallows. Ice cream, ice milk, sherbet, custard, pudding, frozen yogurt. Commercial cake and cookie mixes. Desserts that contain chocolate. Pie crust made with milk-containing margarine; reduced-calorie desserts made with a sugar substitute that  contains lactose. Toffee, peppermint, butterscotch, chocolate, caramels.  FATS & OILS In moderation Butter (as tolerated; contains very small amounts of lactose). Margarines and dressings that do not contain milk, Vegetable oils, shortening, Miracle Whip, mayonnaise, nondairy cream & whipped toppings without lactose or milk solids added (examples: Coffee Rich, Carnation Coffeemate, Rich's Whipped Topping, PolyRich). Berniece Salines. Margarines and salad dressings containing milk; cream, cream cheese; peanut butter with added milk solids, sour cream, chip dips, made with sour cream.  BEVERAGES Carbonated drinks; tea; coffee and freeze-dried coffee; some  instant coffees (check labels). Fruit drinks; fruit and vegetable juice; Rice or Soy milk. Ovaltine, hot chocolate. Some cocoas; some instant coffees; instant iced teas; powdered fruit drinks (read labels).   CONDIMENTS / MISCELLANEOUS Soy sauce, carob powder, olives, gravy made with water, baker's cocoa, pickles, pure seasonings and spices, wine, pure monosodium glutamate, catsup, mustard. Some chewing gums, chocolate, some cocoas. Certain antibiotics and vitamin / mineral preparations. Spice blends if they contain milk products. MSG extender. Artificial sweeteners that contain lactose such as Equal (Nutra-Sweet) and Sweet 'n Low. Some nondairy creamers (read labels).    Diverticulosis Diverticulosis is a common condition that develops when small pouches (diverticula) form in the wall of the colon. The risk of diverticulosis increases with age. It happens more often in people who eat a low-fiber diet. Most individuals with diverticulosis have no symptoms. Those individuals with symptoms usually experience belly (abdominal) pain, constipation, or loose stools (diarrhea).  HOME CARE INSTRUCTIONS  Increase the amount of fiber in your diet as directed by your caregiver or dietician. This may reduce symptoms of diverticulosis.   Drink at least 6 to 8 glasses of water each day to prevent constipation.   Try not to strain when you have a bowel movement.   Avoiding nuts and seeds to prevent complications is NOT NECESSARY.    FOODS HAVING HIGH FIBER CONTENT INCLUDE:  Fruits. Apple, peach, pear, tangerine, raisins, prunes.   Vegetables. Brussels sprouts, asparagus, broccoli, cabbage, carrot, cauliflower, romaine lettuce, spinach, summer squash, tomato, winter squash, zucchini.   Starchy Vegetables. Baked beans, kidney beans, lima beans, split peas, lentils, potatoes (with skin).   Grains. Whole wheat bread, brown rice, bran flake cereal, plain oatmeal, white rice, shredded wheat, bran muffins.      SEEK IMMEDIATE MEDICAL CARE IF:  You develop increasing pain or severe bloating.   You have an oral temperature above 101F.   You develop vomiting or bowel movements that are bloody or black.    Hemorrhoids Hemorrhoids are swollen veins that may develop:  In the butt (rectum). These are called internal hemorrhoids.  Around the opening of the butt (anus). These are called external hemorrhoids. Hemorrhoids can cause pain, itching, or bleeding. Most of the time, they do not cause serious problems. They usually get better with diet changes, lifestyle changes, and other home treatments. What are the causes? This condition may be caused by:  Having trouble pooping (constipation).  Pushing hard (straining) to poop.  Watery poop (diarrhea).  Pregnancy.  Being very overweight (obese).  Sitting for long periods of time.  Heavy lifting or other activity that causes you to strain.  Anal sex.  Riding a bike for a long period of time. What are the signs or symptoms? Symptoms of this condition include:  Pain.  Itching or soreness in the butt.  Bleeding from the butt.  Leaking poop.  Swelling in the area.  One or more lumps around the opening of your butt. How is this  diagnosed? A doctor can often diagnose this condition by looking at the affected area. The doctor may also:  Do an exam that involves feeling the area with a gloved hand (digital rectal exam).  Examine the area inside your butt using a small tube (anoscope).  Order blood tests. This may be done if you have lost a lot of blood.  Have you get a test that involves looking inside the colon using a flexible tube with a camera on the end (sigmoidoscopy or colonoscopy). How is this treated? This condition can usually be treated at home. Your doctor may tell you to change what you eat, make lifestyle changes, or try home treatments. If these do not help, procedures can be done to remove the hemorrhoids or make  them smaller. These may involve:  Placing rubber bands at the base of the hemorrhoids to cut off their blood supply.  Injecting medicine into the hemorrhoids to shrink them.  Shining a type of light energy onto the hemorrhoids to cause them to fall off.  Doing surgery to remove the hemorrhoids or cut off their blood supply. Follow these instructions at home: Eating and drinking   Eat foods that have a lot of fiber in them. These include whole grains, beans, nuts, fruits, and vegetables.  Ask your doctor about taking products that have added fiber (fibersupplements).  Reduce the amount of fat in your diet. You can do this by: ? Eating low-fat dairy products. ? Eating less red meat. ? Avoiding processed foods.  Drink enough fluid to keep your pee (urine) pale yellow. Managing pain and swelling   Take a warm-water bath (sitz bath) for 20 minutes to ease pain. Do this 3-4 times a day. You may do this in a bathtub or using a portable sitz bath that fits over the toilet.  If told, put ice on the painful area. It may be helpful to use ice between your warm baths. ? Put ice in a plastic bag. ? Place a towel between your skin and the bag. ? Leave the ice on for 20 minutes, 2-3 times a day. General instructions  Take over-the-counter and prescription medicines only as told by your doctor. ? Medicated creams and medicines may be used as told.  Exercise often. Ask your doctor how much and what kind of exercise is best for you.  Go to the bathroom when you have the urge to poop. Do not wait.  Avoid pushing too hard when you poop.  Keep your butt dry and clean. Use wet toilet paper or moist towelettes after pooping.  Do not sit on the toilet for a long time.  Keep all follow-up visits as told by your doctor. This is important. Contact a doctor if you:  Have pain and swelling that do not get better with treatment or medicine.  Have trouble pooping.  Cannot poop.  Have pain or  swelling outside the area of the hemorrhoids. Get help right away if you have:  Bleeding that will not stop. Summary  Hemorrhoids are swollen veins in the butt or around the opening of the butt.  They can cause pain, itching, or bleeding.  Eat foods that have a lot of fiber in them. These include whole grains, beans, nuts, fruits, and vegetables.  Take a warm-water bath (sitz bath) for 20 minutes to ease pain. Do this 3-4 times a day. This information is not intended to replace advice given to you by your health care provider. Make sure you discuss  any questions you have with your health care provider. Document Released: 09/21/2008 Document Revised: 05/04/2018 Document Reviewed: 05/04/2018 Elsevier Interactive Patient Education  2019 [employer].

## 2019-02-27 MED ORDER — POTASSIUM CHLORIDE CRYS ER 20 MEQ PO TBCR: 40.0000 meq | EXTENDED_RELEASE_TABLET | ORAL | 3 refills | Status: DC

## 2019-02-27 NOTE — Telephone Encounter (Signed)
Patient informed and verbalized understanding of plan. Patient says that she recently saw her PCP and was given diltiazem 30 mg to take as needed for rapid heart rate, and also that he increased her magnesium to 500 mg daily instead of 250 mg. Medication profile updated with recent changes. Appointment scheduled to see Domenic Polite on 03/29/2019.

## 2019-02-27 NOTE — Telephone Encounter (Signed)
I reviewed the records from [facility]  Patient seen in the ER on February 22-23.  She presented with rapid atrial fibrillation and chest discomfort, spontaneously converted to sinus rhythm  Troponin I levels were negative for ACS.  I personally reviewed her presenting ECG from 02/18/2019 which showed atrial fibrillation at 137 bpm, no acute ST-T wave abnormalities.  Subsequent tracing approximately 2 hours later showed normal sinus rhythm.  Lab work showed BUN 15, creatinine 0.69, potassium 3.2, magnesium 1.5, troponin T less than 0.012, hemoglobin 11.1, platelets 273, UDS negative.  Chest x-ray reported no acute pulmonary process with stable large hiatal hernia.  In reviewing her medication list, she is already on KCl 20 mEq twice daily.  I would suggest that she increase this to 40 mEq in the morning and 20 mEq in the afternoon to get potassium levels more stable.  Would also start magnesium oxide 400 mg once daily.  This may help reduce breakthrough atrial arrhythmias by keeping her electrolytes better in line.  Please schedule an office visit for her, may need to be on APP schedule in [place] depending on availability.  Question will be whether we need to consider using an antiarrhythmic medication to suppress her atrial fibrillation.  This will depend largely on how frequently she is having breakthrough events.  She has a history of normal coronary arteries in the past, flecainide would be an option, even pill in the pocket approach perhaps.  As far as her question regarding sleep apnea evaluation, I do think that would be reasonable since there is an association between uncontrolled sleep apnea and recurring atrial arrhythmias.

## 2019-03-01 ENCOUNTER — Encounter (HOSPITAL_COMMUNITY): Payer: Self-pay | Admitting: Gastroenterology

## 2019-03-06 ENCOUNTER — Telehealth: Payer: Self-pay | Admitting: Gastroenterology

## 2019-03-06 ENCOUNTER — Ambulatory Visit: Payer: Self-pay | Admitting: Nurse Practitioner

## 2019-03-06 NOTE — Telephone Encounter (Signed)
LMOM to call.

## 2019-03-06 NOTE — Telephone Encounter (Signed)
Please call pt. Her colon biopsies are normal. HER LOOSE STOOLS ARE MOST LIKELY DUE TO DAIRY(LACTOSE INTOLERANCE).Marland Kitchen  DRINK WATER TO KEEP YOUR URINE LIGHT YELLOW.  FOLLOW A LACTOSE FREE DIET FOR ONE MONTH.  IF SHE MUST HAVE CHEESE, SHE SHOULD ADD LACTASE 3 PILLS WITH MEALS UP TO THREE TIMES A DAY.  FOLLOW UP IN 4 MOS, DX: DIARRHEA.

## 2019-03-07 ENCOUNTER — Ambulatory Visit: Payer: Medicare Other | Admitting: Nurse Practitioner

## 2019-03-07 ENCOUNTER — Telehealth: Payer: Self-pay | Admitting: Gastroenterology

## 2019-03-07 NOTE — Telephone Encounter (Signed)
PATIENT SCHEDULED  °

## 2019-03-07 NOTE — Telephone Encounter (Signed)
PATIENT RETURNED CALL, PLEASE CALL BACK  °

## 2019-03-07 NOTE — Telephone Encounter (Signed)
PT is aware. I am mailing the diet and she will get the Lactase pills. She said she had had 5 watery stools this morning. Yesterday she ate chili beans, sauerkraut with weiners and potato salad and pintos.  She wanted to know if there is anything else she needs to do.

## 2019-03-07 NOTE — Telephone Encounter (Signed)
PT is aware.

## 2019-03-07 NOTE — Telephone Encounter (Signed)
LMOM to call.

## 2019-03-07 NOTE — Telephone Encounter (Signed)
See previous note

## 2019-03-07 NOTE — Telephone Encounter (Signed)
PLEASE CALL PT. She can use imodium prn loose stools. Potato salad has dairy products in it. FOLLOW LACTOSE FREE DIET AND ADD LACTASE IF SHE DECIDES TO EAT OR DRINK A DAIRY PRODUCT.

## 2019-03-27 ENCOUNTER — Telehealth: Payer: Self-pay | Admitting: *Deleted

## 2019-03-27 NOTE — Telephone Encounter (Signed)
LM to return call.

## 2019-03-28 ENCOUNTER — Encounter: Payer: Self-pay | Admitting: Cardiology

## 2019-03-28 DIAGNOSIS — E782 Mixed hyperlipidemia: Secondary | ICD-10-CM | POA: Diagnosis not present

## 2019-03-28 DIAGNOSIS — I10 Essential (primary) hypertension: Secondary | ICD-10-CM | POA: Diagnosis not present

## 2019-03-28 DIAGNOSIS — I480 Paroxysmal atrial fibrillation: Secondary | ICD-10-CM | POA: Diagnosis not present

## 2019-03-28 DIAGNOSIS — G9009 Other idiopathic peripheral autonomic neuropathy: Secondary | ICD-10-CM | POA: Diagnosis not present

## 2019-03-28 DIAGNOSIS — E1140 Type 2 diabetes mellitus with diabetic neuropathy, unspecified: Secondary | ICD-10-CM | POA: Diagnosis not present

## 2019-03-28 NOTE — Telephone Encounter (Signed)
Pt contacted and consented for telehealth phone visit with Dr Domenic Polite and that insurance would be billed for the encounter - pt medications/pharmacy/allergies reviewed and updated. Has home BP monitor but doesn't have a scale.

## 2019-03-28 NOTE — Progress Notes (Signed)
Virtual Visit via Telephone Note    Evaluation Performed:  Follow-up visit  This visit type was conducted due to national recommendations for restrictions regarding the COVID-19 Pandemic (e.g. social distancing).  This format is felt to be most appropriate for this patient at this time.  All issues noted in this document were discussed and addressed.  No physical exam was performed (except for noted visual exam findings with Video Visits).  The patient verbally consented to a telehealth phone visit today with [facility] and understands that their insurance company will be billed for the encounter.  Date:  03/29/2019   ID:  Martha Torres, DOB November 20, 1946, MRN [id number]  Patient Location:  [place], [place] [address]  Provider location:   [place] [facility] [place], [place],  [address] Phone: [phone]; Fax: [phone]  PCP:  Celene Squibb, MD  Cardiologist:  Rozann Lesches, MD  Chief Complaint: Follow-up atrial fibrillation  History of Present Illness:    RHEALYN Torres is a 73 y.o. female who presents via audio conferencing for a telehealth visit today.  She was last seen in November 2019.  She states that she has not had any palpitations since February.  Chart reviewed, she was seen at [facility] with an episode of rapid atrial fibrillation that spontaneously converted to sinus rhythm.  Potassium supplementation was subsequently increased as well as magnesium supplementation based on review of lab work.  Was also given short acting diltiazem 30 mg tablets to take as needed per PCP.  She has not had to use these diltiazem tablets as yet.  She denies any bleeding problems on Xarelto.  She has continued on potassium and magnesium supplements with follow-up lab work pending.  She is currently out of work, typically employed at a preschool.  The patient does not have symptoms concerning for COVID-19 infection (fever,  chills, cough, or new shortness of breath).    Prior CV studies:   The following studies were reviewed today:  Carotid Dopplers 01/03/2019: Summary: Right Carotid: Velocities in the right ICA are consistent with a 60-79%                stenosis.  Left Carotid: Velocities in the left ICA are consistent with a 40-59% stenosis.  Vertebrals:  Bilateral vertebral arteries demonstrate antegrade flow. Subclavians: Normal flow hemodynamics were seen in bilateral subclavian              arteries.  ECG 02/18/2019: I personally reviewed the tracing which showed atrial fibrillation with RVR.  Past Medical History:  Diagnosis Date  . Carotid artery occlusion    Right CEA in 2002  . Chest pain 2006   Stress nuclear-anteroapical and basilar inferior reversible defects; cath-normal coronary arteries and EF  . Chronic low back pain    MRI 2014: Spondylolisthesis of L4-L5 with foraminal narrowing, most prominent on the right and facet arthropathy.  L4-L5 surgery planned for 04/2013 with left-sided pedicle screw fixation.  . Degenerative joint disease   . DVT (deep venous thrombosis) ([place])   . Essential hypertension   . Gastroesophageal reflux    H/o stricture & hiatal hernia  . Hyperlipidemia   . MRSA (methicillin resistant Staphylococcus aureus) June 2014  . Paroxysmal atrial fibrillation ([place]) 04/2013  . Type 2 diabetes mellitus ([place])    Past Surgical History:  Procedure Laterality Date  . BIOPSY  02/26/2019   Procedure: BIOPSY;  Surgeon: Oneida Alar,  Marga Melnick, MD;  [facility];  Service: Endoscopy;;  random colon bx's  . CAROTID ENDARTERECTOMY Right 10/25/2001   Subsequent to episode of syncope  . COLONOSCOPY N/A 02/26/2019   Procedure: COLONOSCOPY;  Surgeon: Danie Binder, MD;  [facility];  Service: Endoscopy;  Laterality: N/A;  8:30am  . LAMINECTOMY  2000   L5-S1 discectomy and laminectomy; 3 other surgical procedures subsequently performed  . LAPAROSCOPIC  CHOLECYSTECTOMY    . SPINE SURGERY  05-23-13   X's 5 surgeries  . VAGINAL HYSTERECTOMY       Current Meds  Medication Sig  . acetaminophen (TYLENOL) 650 MG CR tablet Take 650 mg by mouth See admin instructions. Take 650 mg twice daily, may take a 3rd 650 mg dose midday as needed for pain  . ALPRAZolam (XANAX) 0.5 MG tablet Take 0.5 mg by mouth at bedtime.   Marland Kitchen atenolol (TENORMIN) 50 MG tablet Take 1 tablet (50 mg total) by mouth 2 (two) times daily.  . Carboxymethylcellul-Glycerin (LUBRICATING EYE DROPS OP) Place 2 drops into both eyes daily as needed (dry eyes).  . chlorthalidone (HYGROTON) 25 MG tablet Take 25 mg by mouth daily.   Marland Kitchen dicyclomine (BENTYL) 10 MG capsule Take 1 capsule (10 mg total) by mouth 4 (four) times daily as needed (diarrhea, abdominal discomfort).  Marland Kitchen diltiazem (CARDIZEM) 30 MG tablet Take 1 tablet by mouth as needed. At onset of rapid heart rate  . gabapentin (NEURONTIN) 300 MG capsule Take 300 mg by mouth 2 (two) times daily.   Marland Kitchen glipiZIDE (GLUCOTROL XL) 5 MG 24 hr tablet Take 5 mg by mouth 2 (two) times daily.  Marland Kitchen levothyroxine (SYNTHROID, LEVOTHROID) 50 MCG tablet Take 50 mcg by mouth daily.  Marland Kitchen losartan (COZAAR) 50 MG tablet Take 50 mg by mouth daily.  . Magnesium 250 MG TABS Take 500 mg by mouth daily.   . meclizine (ANTIVERT) 25 MG tablet Take 25 mg 3 (three) times daily as needed by mouth for dizziness.  Marland Kitchen omeprazole (PRILOSEC) 20 MG capsule Take 20 mg by mouth daily.  . potassium chloride SA (K-DUR,KLOR-CON) 20 MEQ tablet Take 2 tablets (40 mEq total) by mouth every morning. & 20 meq in the afternoon  . pravastatin (PRAVACHOL) 20 MG tablet Take 20 mg by mouth at bedtime.  . rivaroxaban (XARELTO) 20 MG TABS tablet Take 1 tablet (20 mg total) by mouth daily with supper. (Patient taking differently: Take 20 mg by mouth at bedtime. )  . trolamine salicylate (ASPERCREME) 10 % cream Apply 1 application topically 2 (two) times daily.     Allergies:   Patient has no  known allergies.   Social History   Tobacco Use  . Smoking status: Former Smoker    Packs/day: 0.25    Years: 43.00    Pack years: 10.75    Types: Cigarettes    Start date: 03/10/1965    Last attempt to quit: 11/06/2008    Years since quitting: 10.3  . Smokeless tobacco: Never Used  Substance Use Topics  . Alcohol use: Never    Alcohol/week: 1.0 standard drinks    Types: 1 Standard drinks or equivalent per week    Frequency: Never  . Drug use: Never     Family Hx: The patient's family history includes Cancer in her brother and sister; Cancer - Prostate in her father; Diabetes in her sister; Heart attack in her father and mother; Heart disease in her father and mother; Hyperlipidemia in her brother,  father, mother, sister, and sister; Hypertension in her brother, sister, and sister; Stroke in her mother. There is no history of Colon cancer.  ROS:   Please see the history of present illness.    All other systems reviewed and are negative.   Labs/Other Tests and Data Reviewed:    Recent Labs: 10/06/2018: ALT 16; BUN 13; Creatinine, Ser 0.67; Hemoglobin 10.8; Platelets 278; Potassium 3.8; Sodium 139 12/14/2018: TSH 0.97   February 2020: BUN 15, creatinine 0.69, potassium 3.2, magnesium 1.5, troponin T less than 0.012, hemoglobin 11.1, platelets 273, UDS negative  Wt Readings from Last 3 Encounters:  02/26/19 216 lb (98 kg)  01/03/19 220 lb (99.8 kg)  12/05/18 220 lb 6.4 oz (100 kg)     Objective:    Vital Signs:  BP (!) 157/86   Pulse 72   Ht 5\' 5"  (1.651 m)   BMI 35.94 kg/m    ASSESSMENT & PLAN:    1.  Paroxysmal atrial fibrillation with CHADSVASC score of 5.  Continue Xarelto and atenolol, more recently placed on as needed short acting diltiazem for breakthrough palpitations.  Also continue with electrolyte supplementation and follow-up lab work as scheduled.  2.  Essential hypertension, blood pressure is mildly elevated today.  No changes made to current  regimen.  Keep follow-up with Dr. [place] Crane.  COVID-19 Education: The signs and symptoms of COVID-19 were discussed with the patient and how to seek care for testing (follow up with PCP or arrange E-visit).  The importance of social distancing was discussed today.  Patient Risk:   After full review of this patient's clinical status, I feel that they are at least moderate risk at this time.  Time:   Today, I have spent 6 minutes with the patient with telehealth technology discussing atrial fibrillation symptoms and current medical therapy.     Medication Adjustments/Labs and Tests Ordered: Current medicines are reviewed at length with the patient today.  Concerns regarding medicines are outlined above.  Tests Ordered: No orders of the defined types were placed in this encounter.  Medication Changes: No orders of the defined types were placed in this encounter.   Disposition:  Follow up 3 months  Signed, Rozann Lesches, MD  03/29/2019 1:33 PM    [facility]

## 2019-03-29 ENCOUNTER — Encounter: Payer: Self-pay | Admitting: Cardiology

## 2019-03-29 ENCOUNTER — Telehealth (INDEPENDENT_AMBULATORY_CARE_PROVIDER_SITE_OTHER): Payer: Medicare Other | Admitting: Cardiology

## 2019-03-29 ENCOUNTER — Ambulatory Visit: Payer: Self-pay | Admitting: Cardiology

## 2019-03-29 VITALS — BP 157/86 | HR 72 | Ht 65.0 in

## 2019-03-29 DIAGNOSIS — I10 Essential (primary) hypertension: Secondary | ICD-10-CM

## 2019-03-29 DIAGNOSIS — I480 Paroxysmal atrial fibrillation: Secondary | ICD-10-CM | POA: Diagnosis not present

## 2019-03-29 NOTE — Patient Instructions (Signed)
Medication Instructions:   Your physician recommends that you continue on your current medications as directed. Please refer to the Current Medication list given to you today.  Labwork:  NONE  Testing/Procedures:  NONE  Follow-Up:  Your physician recommends that you schedule a follow-up appointment in: 3 months.  Any Other Special Instructions Will Be Listed Below (If Applicable).  If you need a refill on your cardiac medications before your next appointment, please call your pharmacy. 

## 2019-03-30 DIAGNOSIS — G629 Polyneuropathy, unspecified: Secondary | ICD-10-CM | POA: Diagnosis not present

## 2019-03-30 DIAGNOSIS — Z7901 Long term (current) use of anticoagulants: Secondary | ICD-10-CM | POA: Diagnosis not present

## 2019-03-30 DIAGNOSIS — R42 Dizziness and giddiness: Secondary | ICD-10-CM | POA: Diagnosis not present

## 2019-03-30 DIAGNOSIS — Z8669 Personal history of other diseases of the nervous system and sense organs: Secondary | ICD-10-CM | POA: Diagnosis not present

## 2019-03-30 DIAGNOSIS — I10 Essential (primary) hypertension: Secondary | ICD-10-CM | POA: Diagnosis not present

## 2019-04-30 ENCOUNTER — Other Ambulatory Visit: Payer: Self-pay | Admitting: Internal Medicine

## 2019-04-30 DIAGNOSIS — E2839 Other primary ovarian failure: Secondary | ICD-10-CM

## 2019-04-30 DIAGNOSIS — R197 Diarrhea, unspecified: Secondary | ICD-10-CM | POA: Diagnosis not present

## 2019-04-30 DIAGNOSIS — H8110 Benign paroxysmal vertigo, unspecified ear: Secondary | ICD-10-CM | POA: Diagnosis not present

## 2019-04-30 DIAGNOSIS — Z0000 Encounter for general adult medical examination without abnormal findings: Secondary | ICD-10-CM | POA: Diagnosis not present

## 2019-04-30 DIAGNOSIS — E739 Lactose intolerance, unspecified: Secondary | ICD-10-CM | POA: Diagnosis not present

## 2019-04-30 DIAGNOSIS — E876 Hypokalemia: Secondary | ICD-10-CM | POA: Diagnosis not present

## 2019-05-02 DIAGNOSIS — M7580 Other shoulder lesions, unspecified shoulder: Secondary | ICD-10-CM | POA: Diagnosis not present

## 2019-05-02 DIAGNOSIS — M170 Bilateral primary osteoarthritis of knee: Secondary | ICD-10-CM | POA: Diagnosis not present

## 2019-05-30 ENCOUNTER — Ambulatory Visit: Payer: Self-pay | Admitting: Cardiology

## 2019-06-05 DIAGNOSIS — R197 Diarrhea, unspecified: Secondary | ICD-10-CM | POA: Diagnosis not present

## 2019-06-05 DIAGNOSIS — E119 Type 2 diabetes mellitus without complications: Secondary | ICD-10-CM | POA: Diagnosis not present

## 2019-06-05 DIAGNOSIS — E1169 Type 2 diabetes mellitus with other specified complication: Secondary | ICD-10-CM | POA: Diagnosis not present

## 2019-06-05 DIAGNOSIS — I480 Paroxysmal atrial fibrillation: Secondary | ICD-10-CM | POA: Diagnosis not present

## 2019-06-05 DIAGNOSIS — I6529 Occlusion and stenosis of unspecified carotid artery: Secondary | ICD-10-CM | POA: Diagnosis not present

## 2019-06-05 DIAGNOSIS — M170 Bilateral primary osteoarthritis of knee: Secondary | ICD-10-CM | POA: Diagnosis not present

## 2019-06-06 DIAGNOSIS — E739 Lactose intolerance, unspecified: Secondary | ICD-10-CM | POA: Diagnosis not present

## 2019-06-06 DIAGNOSIS — R197 Diarrhea, unspecified: Secondary | ICD-10-CM | POA: Diagnosis not present

## 2019-06-06 DIAGNOSIS — H8110 Benign paroxysmal vertigo, unspecified ear: Secondary | ICD-10-CM | POA: Diagnosis not present

## 2019-06-06 DIAGNOSIS — Z7901 Long term (current) use of anticoagulants: Secondary | ICD-10-CM | POA: Diagnosis not present

## 2019-06-06 DIAGNOSIS — Z8669 Personal history of other diseases of the nervous system and sense organs: Secondary | ICD-10-CM | POA: Diagnosis not present

## 2019-06-15 DIAGNOSIS — E039 Hypothyroidism, unspecified: Secondary | ICD-10-CM | POA: Diagnosis not present

## 2019-06-15 DIAGNOSIS — I480 Paroxysmal atrial fibrillation: Secondary | ICD-10-CM | POA: Diagnosis not present

## 2019-06-15 DIAGNOSIS — K219 Gastro-esophageal reflux disease without esophagitis: Secondary | ICD-10-CM | POA: Diagnosis not present

## 2019-06-15 DIAGNOSIS — E782 Mixed hyperlipidemia: Secondary | ICD-10-CM | POA: Diagnosis not present

## 2019-06-15 DIAGNOSIS — I10 Essential (primary) hypertension: Secondary | ICD-10-CM | POA: Diagnosis not present

## 2019-07-02 ENCOUNTER — Other Ambulatory Visit: Payer: Self-pay

## 2019-07-02 DIAGNOSIS — I6523 Occlusion and stenosis of bilateral carotid arteries: Secondary | ICD-10-CM

## 2019-07-03 ENCOUNTER — Telehealth (HOSPITAL_COMMUNITY): Payer: Self-pay

## 2019-07-03 NOTE — Telephone Encounter (Signed)
Left voicemail with Covid information.       

## 2019-07-04 ENCOUNTER — Inpatient Hospital Stay (HOSPITAL_COMMUNITY): Admission: RE | Admit: 2019-07-04 | Payer: Self-pay | Source: Ambulatory Visit

## 2019-07-04 ENCOUNTER — Ambulatory Visit: Payer: Medicare Other | Admitting: Vascular Surgery

## 2019-07-06 ENCOUNTER — Telehealth (INDEPENDENT_AMBULATORY_CARE_PROVIDER_SITE_OTHER): Payer: Medicare Other | Admitting: Cardiology

## 2019-07-06 ENCOUNTER — Encounter: Payer: Self-pay | Admitting: Cardiology

## 2019-07-06 ENCOUNTER — Ambulatory Visit: Payer: Medicare Other | Admitting: Nurse Practitioner

## 2019-07-06 ENCOUNTER — Encounter: Payer: Self-pay | Admitting: *Deleted

## 2019-07-06 VITALS — BP 140/70 | HR 71 | Ht 65.0 in

## 2019-07-06 DIAGNOSIS — I6523 Occlusion and stenosis of bilateral carotid arteries: Secondary | ICD-10-CM | POA: Diagnosis not present

## 2019-07-06 DIAGNOSIS — I480 Paroxysmal atrial fibrillation: Secondary | ICD-10-CM

## 2019-07-06 DIAGNOSIS — Z7189 Other specified counseling: Secondary | ICD-10-CM

## 2019-07-06 DIAGNOSIS — I10 Essential (primary) hypertension: Secondary | ICD-10-CM

## 2019-07-06 NOTE — Progress Notes (Signed)
Virtual Visit via Telephone Note   This visit type was conducted due to national recommendations for restrictions regarding the COVID-19 Pandemic (e.g. social distancing) in an effort to limit this patient's exposure and mitigate transmission in our community.  Due to her co-morbid illnesses, this patient is at least at moderate risk for complications without adequate follow up.  This format is felt to be most appropriate for this patient at this time.  The patient did not have access to video technology/had technical difficulties with video requiring transitioning to audio format only (telephone).  All issues noted in this document were discussed and addressed.  No physical exam could be performed with this format.  Please refer to the patient's chart for her  consent to telehealth for [facility].   Date:  07/06/2019   ID:  Martha Torres, DOB 1946-08-29, MRN [id number]  Patient Location: Other:  Work Provider Location: Office  PCP:  Celene Squibb, MD  Cardiologist:  Rozann Lesches, MD Electrophysiologist:  None   Evaluation Performed:  Follow-Up Visit  Chief Complaint:   Cardiac follow-up  History of Present Illness:    Martha Torres is a 73 y.o. female last assessed by telehealth encounter in April.  She did not have video access and we spoke by phone today.  She does not report any breakthrough palpitations since last assessment, has not had to use short acting Cardizem.  She is back at work in a preschool.  She is due for follow-up lab work on [employer].  She states that she had lab work obtained a month ago by Dr. [place] Crane which we will request.  She does not report any bleeding problems.  I reviewed her medications which are listed below.  The patient does not have symptoms concerning for COVID-19 infection (fever, chills, cough, or new shortness of breath).   Past Medical History:  Diagnosis Date  . Carotid artery occlusion    Right CEA in 2002  . Chest pain 2006   Stress  nuclear-anteroapical and basilar inferior reversible defects; cath-normal coronary arteries and EF  . Chronic low back pain    MRI 2014: Spondylolisthesis of L4-L5 with foraminal narrowing, most prominent on the right and facet arthropathy.  L4-L5 surgery planned for 04/2013 with left-sided pedicle screw fixation.  . Degenerative joint disease   . DVT (deep venous thrombosis) ([place])   . Essential hypertension   . Gastroesophageal reflux    H/o stricture & hiatal hernia  . Hyperlipidemia   . MRSA (methicillin resistant Staphylococcus aureus) June 2014  . Paroxysmal atrial fibrillation ([place]) 04/2013  . Type 2 diabetes mellitus ([place])    Past Surgical History:  Procedure Laterality Date  . BIOPSY  02/26/2019   Procedure: BIOPSY;  Surgeon: Danie Binder, MD;  [facility];  Service: Endoscopy;;  random colon bx's  . CAROTID ENDARTERECTOMY Right 10/25/2001   Subsequent to episode of syncope  . COLONOSCOPY N/A 02/26/2019   Procedure: COLONOSCOPY;  Surgeon: Danie Binder, MD;  [facility];  Service: Endoscopy;  Laterality: N/A;  8:30am  . LAMINECTOMY  2000   L5-S1 discectomy and laminectomy; 3 other surgical procedures subsequently performed  . LAPAROSCOPIC CHOLECYSTECTOMY    . SPINE SURGERY  05-23-13   X's 5 surgeries  . VAGINAL HYSTERECTOMY       Current Meds  Medication Sig  . acetaminophen (TYLENOL) 650 MG CR tablet Take 650 mg by mouth See admin instructions. Take 650 mg twice daily, may  take a 3rd 650 mg dose midday as needed for pain  . ALPRAZolam (XANAX) 0.5 MG tablet Take 0.5 mg by mouth at bedtime.   Marland Kitchen atenolol (TENORMIN) 50 MG tablet Take 1 tablet (50 mg total) by mouth 2 (two) times daily.  . Carboxymethylcellul-Glycerin (LUBRICATING EYE DROPS OP) Place 2 drops into both eyes daily as needed (dry eyes).  . chlorthalidone (HYGROTON) 25 MG tablet Take 25 mg by mouth daily.   Marland Kitchen diltiazem (CARDIZEM) 30 MG tablet Take 1 tablet by mouth as needed. At onset of  rapid heart rate  . ferrous sulfate 325 (65 FE) MG tablet Take 325 mg by mouth daily with breakfast.  . gabapentin (NEURONTIN) 300 MG capsule Take 300 mg by mouth 2 (two) times daily.   Marland Kitchen glipiZIDE (GLUCOTROL XL) 5 MG 24 hr tablet Take 5 mg by mouth daily with breakfast.   . levothyroxine (SYNTHROID, LEVOTHROID) 50 MCG tablet Take 50 mcg by mouth daily.  Marland Kitchen losartan (COZAAR) 50 MG tablet Take 50 mg by mouth daily.  . Magnesium 250 MG TABS Take 250 mg by mouth 2 (two) times a day.   . meclizine (ANTIVERT) 25 MG tablet Take 25 mg 3 (three) times daily as needed by mouth for dizziness.  Marland Kitchen omeprazole (PRILOSEC) 20 MG capsule Take 20 mg by mouth daily.  . potassium chloride SA (K-DUR,KLOR-CON) 20 MEQ tablet Take 2 tablets (40 mEq total) by mouth every morning. & 20 meq in the afternoon (Patient taking differently: Take 40 mEq by mouth every morning. & 20 meq in the bedtime)  . pravastatin (PRAVACHOL) 20 MG tablet Take 20 mg by mouth at bedtime.  . rivaroxaban (XARELTO) 20 MG TABS tablet Take 1 tablet (20 mg total) by mouth daily with supper. (Patient taking differently: Take 20 mg by mouth at bedtime. )  . trolamine salicylate (ASPERCREME) 10 % cream Apply 1 application topically 2 (two) times daily.     Allergies:   Patient has no known allergies.   Social History   Tobacco Use  . Smoking status: Former Smoker    Packs/day: 0.25    Years: 43.00    Pack years: 10.75    Types: Cigarettes    Start date: 03/10/1965    Quit date: 11/06/2008    Years since quitting: 10.6  . Smokeless tobacco: Never Used  Substance Use Topics  . Alcohol use: Never    Alcohol/week: 1.0 standard drinks    Types: 1 Standard drinks or equivalent per week    Frequency: Never  . Drug use: Never     Family Hx: The patient's family history includes Cancer in her brother and sister; Cancer - Prostate in her father; Diabetes in her sister; Heart attack in her father and mother; Heart disease in her father and mother;  Hyperlipidemia in her brother, father, mother, sister, and sister; Hypertension in her brother, sister, and sister; Stroke in her mother. There is no history of Colon cancer.  ROS:   Please see the history of present illness.    Intermittent dizziness.  No syncope. All other systems reviewed and are negative.   Prior CV studies:   The following studies were reviewed today:  Carotid Dopplers 01/03/2019: Summary: Right Carotid: Velocities in the right ICA are consistent with a 60-79% stenosis.  Left Carotid: Velocities in the left ICA are consistent with a 40-59% stenosis.  Vertebrals: Bilateral vertebral arteries demonstrate antegrade flow. Subclavians: Normal flow hemodynamics were seen in bilateral subclavian arteries.  Labs/Other Tests and  Data Reviewed:    EKG:  An ECG dated 02/18/2019 was personally reviewed today and demonstrated:  Atrial fibrillation with RVR.  Recent Labs: 10/06/2018: ALT 16; BUN 13; Creatinine, Ser 0.67; Hemoglobin 10.8; Platelets 278; Potassium 3.8; Sodium 139 12/14/2018: TSH 0.97  February 2020: BUN 15, creatinine 0.69, potassium 3.2, magnesium 1.5, troponin T less than 0.012, hemoglobin 11.1, platelets 273, UDS negative   Wt Readings from Last 3 Encounters:  02/26/19 216 lb (98 kg)  01/03/19 220 lb (99.8 kg)  12/05/18 220 lb 6.4 oz (100 kg)     Objective:    Vital Signs:  BP 140/70   Pulse 71   Ht 5\' 5"  (1.651 m)   BMI 35.94 kg/m    Patient spoke in full sentences, not short of breath. No audible wheezing or coughing. Speech pattern normal.  ASSESSMENT & PLAN:    1.  Paroxysmal atrial fibrillation with chads vas score 5.  Continue Xarelto and atenolol.  She has short acting Cardizem available if needed but her palpitations have been well controlled over the last few months.  Requesting recent lab work from Dr. [place] Crane.  2.  Essential hypertension, no changes made to present regimen.  3.  Carotid artery  disease status post right CEA in 2002.  Follow-up carotid Dopplers in January revealed 60 to 79% R ICA stenosis and 40 to 77% LICA stenosis.  She is asymptomatic at this time.  Continue statin therapy.  Not on aspirin given concurrent use of Xarelto.  COVID-19 Education: The signs and symptoms of COVID-19 were discussed with the patient and how to seek care for testing (follow up with PCP or arrange E-visit).  =The importance of social distancing was discussed today.  Time:   Today, I have spent 6 minutes with the patient with telehealth technology discussing the above problems.     Medication Adjustments/Labs and Tests Ordered: Current medicines are reviewed at length with the patient today.  Concerns regarding medicines are outlined above.   Tests Ordered: No orders of the defined types were placed in this encounter.   Medication Changes: No orders of the defined types were placed in this encounter.   Follow Up:  In Person 6 months in the [place] office.  Signed, Rozann Lesches, MD  07/06/2019 10:11 AM    [place]

## 2019-07-06 NOTE — Patient Instructions (Addendum)

## 2019-07-20 ENCOUNTER — Encounter: Payer: Self-pay | Admitting: Adult Health Nurse Practitioner

## 2019-08-01 DIAGNOSIS — M25569 Pain in unspecified knee: Secondary | ICD-10-CM | POA: Diagnosis not present

## 2019-08-01 DIAGNOSIS — M79603 Pain in arm, unspecified: Secondary | ICD-10-CM | POA: Diagnosis not present

## 2019-08-01 DIAGNOSIS — M25519 Pain in unspecified shoulder: Secondary | ICD-10-CM | POA: Diagnosis not present

## 2019-08-02 DIAGNOSIS — M170 Bilateral primary osteoarthritis of knee: Secondary | ICD-10-CM | POA: Diagnosis not present

## 2019-08-06 ENCOUNTER — Telehealth: Payer: Self-pay | Admitting: Cardiology

## 2019-08-06 MED ORDER — RIVAROXABAN 20 MG PO TABS: 20.0000 mg | ORAL_TABLET | Freq: Every day | ORAL | 0 refills | Status: DC

## 2019-08-06 NOTE — Telephone Encounter (Signed)
Pt will come by Thursday to pick up samples - aware to call and we will bring Xarelto samples out to her

## 2019-08-06 NOTE — Telephone Encounter (Signed)
Patient calling the office for samples of medication:   1.  What medication and dosage are you requesting samples for? Xalreto  Please leave a message if you call after 9AM

## 2019-08-15 DIAGNOSIS — M25512 Pain in left shoulder: Secondary | ICD-10-CM | POA: Diagnosis not present

## 2019-08-15 DIAGNOSIS — M19012 Primary osteoarthritis, left shoulder: Secondary | ICD-10-CM | POA: Diagnosis not present

## 2019-08-29 DIAGNOSIS — M19012 Primary osteoarthritis, left shoulder: Secondary | ICD-10-CM | POA: Diagnosis not present

## 2019-08-29 DIAGNOSIS — M7582 Other shoulder lesions, left shoulder: Secondary | ICD-10-CM | POA: Diagnosis not present

## 2019-08-29 DIAGNOSIS — M25512 Pain in left shoulder: Secondary | ICD-10-CM | POA: Diagnosis not present

## 2019-08-29 DIAGNOSIS — M25412 Effusion, left shoulder: Secondary | ICD-10-CM | POA: Diagnosis not present

## 2019-08-29 DIAGNOSIS — M75102 Unspecified rotator cuff tear or rupture of left shoulder, not specified as traumatic: Secondary | ICD-10-CM | POA: Diagnosis not present

## 2019-09-11 DIAGNOSIS — M19012 Primary osteoarthritis, left shoulder: Secondary | ICD-10-CM | POA: Diagnosis not present

## 2019-09-11 DIAGNOSIS — M7522 Bicipital tendinitis, left shoulder: Secondary | ICD-10-CM | POA: Diagnosis not present

## 2019-09-17 ENCOUNTER — Telehealth: Payer: Self-pay | Admitting: *Deleted

## 2019-09-17 DIAGNOSIS — M25519 Pain in unspecified shoulder: Secondary | ICD-10-CM | POA: Diagnosis not present

## 2019-09-17 DIAGNOSIS — M25569 Pain in unspecified knee: Secondary | ICD-10-CM | POA: Diagnosis not present

## 2019-09-17 DIAGNOSIS — M79603 Pain in arm, unspecified: Secondary | ICD-10-CM | POA: Diagnosis not present

## 2019-09-17 MED ORDER — RIVAROXABAN 20 MG PO TABS: 20.0000 mg | ORAL_TABLET | Freq: Every day | ORAL | 0 refills | Status: DC

## 2019-09-17 NOTE — Telephone Encounter (Signed)
Samples of xarelto Rollene Fare will pick up driving white [employer]-will call before leaving home so they can be brought out to car

## 2019-09-27 DIAGNOSIS — M25569 Pain in unspecified knee: Secondary | ICD-10-CM | POA: Diagnosis not present

## 2019-09-27 DIAGNOSIS — M25519 Pain in unspecified shoulder: Secondary | ICD-10-CM | POA: Diagnosis not present

## 2019-09-27 DIAGNOSIS — M79603 Pain in arm, unspecified: Secondary | ICD-10-CM | POA: Diagnosis not present

## 2019-10-07 DIAGNOSIS — Z87891 Personal history of nicotine dependence: Secondary | ICD-10-CM | POA: Diagnosis not present

## 2019-10-07 DIAGNOSIS — E039 Hypothyroidism, unspecified: Secondary | ICD-10-CM | POA: Diagnosis not present

## 2019-10-07 DIAGNOSIS — I4891 Unspecified atrial fibrillation: Secondary | ICD-10-CM | POA: Diagnosis not present

## 2019-10-07 DIAGNOSIS — Z79899 Other long term (current) drug therapy: Secondary | ICD-10-CM | POA: Diagnosis not present

## 2019-10-07 DIAGNOSIS — I10 Essential (primary) hypertension: Secondary | ICD-10-CM | POA: Diagnosis not present

## 2019-10-07 DIAGNOSIS — Z7901 Long term (current) use of anticoagulants: Secondary | ICD-10-CM | POA: Diagnosis not present

## 2019-10-07 DIAGNOSIS — E1140 Type 2 diabetes mellitus with diabetic neuropathy, unspecified: Secondary | ICD-10-CM | POA: Diagnosis not present

## 2019-10-08 ENCOUNTER — Telehealth: Payer: Self-pay | Admitting: Cardiology

## 2019-10-08 ENCOUNTER — Encounter: Payer: Self-pay | Admitting: *Deleted

## 2019-10-08 NOTE — Telephone Encounter (Signed)
Patient informed and scheduled for a virtual visit on 11/15/2019 @4 :00 pm as this works best for her work schedule. Advised to call our office if she has any more problems. Verbalized understanding.

## 2019-10-08 NOTE — Telephone Encounter (Signed)
Patient states that woke up around 11:00pm last evening with tightness in her chest. Went to [facility] ER was told to contact our office today. Leave message if patient does not answer.

## 2019-10-08 NOTE — Telephone Encounter (Signed)
Records will be sent to [facility] for review. Offered several appointments to patient but due to her work schedule was unable to accommodate her.

## 2019-10-08 NOTE — Telephone Encounter (Signed)
Will request records.

## 2019-10-08 NOTE — Telephone Encounter (Signed)
It looks like she was in AF but spontaneously converted to sinus rhythm. We just need to get a routine visit scheduled - either with me or [place]. Please try and coordinate with her schedule as best you can.

## 2019-10-15 ENCOUNTER — Telehealth: Payer: Self-pay | Admitting: Cardiology

## 2019-10-15 MED ORDER — RIVAROXABAN 20 MG PO TABS: 20.0000 mg | ORAL_TABLET | Freq: Every day | ORAL | 0 refills | Status: DC

## 2019-10-15 NOTE — Telephone Encounter (Signed)
Needing Xalreto samples

## 2019-10-15 NOTE — Telephone Encounter (Signed)
Patient aware that samples are available for pick up.

## 2019-10-22 DIAGNOSIS — E782 Mixed hyperlipidemia: Secondary | ICD-10-CM | POA: Diagnosis not present

## 2019-10-22 DIAGNOSIS — I10 Essential (primary) hypertension: Secondary | ICD-10-CM | POA: Diagnosis not present

## 2019-10-22 DIAGNOSIS — E119 Type 2 diabetes mellitus without complications: Secondary | ICD-10-CM | POA: Diagnosis not present

## 2019-10-22 DIAGNOSIS — E039 Hypothyroidism, unspecified: Secondary | ICD-10-CM | POA: Diagnosis not present

## 2019-10-26 DIAGNOSIS — E782 Mixed hyperlipidemia: Secondary | ICD-10-CM | POA: Diagnosis not present

## 2019-10-26 DIAGNOSIS — E039 Hypothyroidism, unspecified: Secondary | ICD-10-CM | POA: Diagnosis not present

## 2019-10-26 DIAGNOSIS — I10 Essential (primary) hypertension: Secondary | ICD-10-CM | POA: Diagnosis not present

## 2019-10-26 DIAGNOSIS — I480 Paroxysmal atrial fibrillation: Secondary | ICD-10-CM | POA: Diagnosis not present

## 2019-10-26 DIAGNOSIS — K219 Gastro-esophageal reflux disease without esophagitis: Secondary | ICD-10-CM | POA: Diagnosis not present

## 2019-11-12 ENCOUNTER — Other Ambulatory Visit: Payer: Self-pay | Admitting: Cardiology

## 2019-11-12 DIAGNOSIS — J209 Acute bronchitis, unspecified: Secondary | ICD-10-CM | POA: Diagnosis not present

## 2019-11-12 DIAGNOSIS — J069 Acute upper respiratory infection, unspecified: Secondary | ICD-10-CM | POA: Diagnosis not present

## 2019-11-12 MED ORDER — RIVAROXABAN 20 MG PO TABS: 20.0000 mg | ORAL_TABLET | Freq: Every day | ORAL | 0 refills | Status: DC

## 2019-11-12 NOTE — Telephone Encounter (Signed)
Message left that samples available for pick up

## 2019-11-12 NOTE — Telephone Encounter (Signed)
Patient calling the office for samples of medication: ° ° °1.  What medication and dosage are you requesting samples for? Xalreto ° °2.  Are you currently out of this medication?  ° ° °

## 2019-11-15 ENCOUNTER — Encounter: Payer: Self-pay | Admitting: Cardiology

## 2019-11-15 ENCOUNTER — Telehealth (INDEPENDENT_AMBULATORY_CARE_PROVIDER_SITE_OTHER): Payer: Medicare Other | Admitting: Cardiology

## 2019-11-15 DIAGNOSIS — I480 Paroxysmal atrial fibrillation: Secondary | ICD-10-CM

## 2019-11-15 DIAGNOSIS — I6523 Occlusion and stenosis of bilateral carotid arteries: Secondary | ICD-10-CM | POA: Diagnosis not present

## 2019-11-15 DIAGNOSIS — I10 Essential (primary) hypertension: Secondary | ICD-10-CM

## 2019-11-15 NOTE — Progress Notes (Signed)
Virtual Visit via Telephone Note   This visit type was conducted due to national recommendations for restrictions regarding the COVID-19 Pandemic (e.g. social distancing) in an effort to limit this patient's exposure and mitigate transmission in our community.  Due to her co-morbid illnesses, this patient is at least at moderate risk for complications without adequate follow up.  This format is felt to be most appropriate for this patient at this time.  The patient did not have access to video technology/had technical difficulties with video requiring transitioning to audio format only (telephone).  All issues noted in this document were discussed and addressed.  No physical exam could be performed with this format.  Please refer to the patient's chart for her  consent to telehealth for [facility].   Date:  11/15/2019   ID:  [name], DOB [birth year], MRN [id number]  Patient Location: Home Provider Location: Office  PCP:  Celene Squibb, MD  Cardiologist:  Rozann Lesches, MD Electrophysiologist:  None   Evaluation Performed:  Follow-Up Visit  Chief Complaint:   Cardiac follow-up  History of Present Illness:    Martha Torres is a 73 y.o. female last assessed via telehealth encounter in July.  We spoke by phone today.  From a cardiac perspective she has been doing well.  She states that she was seen in the ER about a month ago with rapid atrial fibrillation although was treated with IV rate control and sent home.  She has continued on stable dose of atenolol and has short acting Cardizem available as well.  No reported bleeding problems on Xarelto.  I reviewed her lab work from June as outlined below.  She states that she was diagnosed with SARS coronavirus 2 on Monday per Dr. Nevada Crane.  Has had fairly mild symptoms, some upper respiratory and also diarrhea.  She is quarantining at home.  She was to follow-up with Dr. Scot Dock soon but plans to reschedule the visit due to her COVID-19  diagnosis.  Last carotid Dopplers were in January.   Past Medical History:  Diagnosis Date  . Carotid artery occlusion    Right CEA in 2002  . Chest pain 2006   Stress nuclear-anteroapical and basilar inferior reversible defects; cath-normal coronary arteries and EF  . Chronic low back pain    MRI 2014: Spondylolisthesis of L4-L5 with foraminal narrowing, most prominent on the right and facet arthropathy.  L4-L5 surgery planned for 04/2013 with left-sided pedicle screw fixation.  . Degenerative joint disease   . DVT (deep venous thrombosis) ([place])   . Essential hypertension   . Gastroesophageal reflux    H/o stricture & hiatal hernia  . Hyperlipidemia   . MRSA (methicillin resistant Staphylococcus aureus) June 2014  . Paroxysmal atrial fibrillation ([place]) 04/2013  . Type 2 diabetes mellitus ([place])    Past Surgical History:  Procedure Laterality Date  . BIOPSY  02/26/2019   Procedure: BIOPSY;  Surgeon: Danie Binder, MD;  Location: [facility];  Service: Endoscopy;;  random colon bx's  . CAROTID ENDARTERECTOMY Right 10/25/2001   Subsequent to episode of syncope  . COLONOSCOPY N/A 02/26/2019   Procedure: COLONOSCOPY;  Surgeon: Danie Binder, MD;  Location: [facility];  Service: Endoscopy;  Laterality: N/A;  8:30am  . LAMINECTOMY  2000   L5-S1 discectomy and laminectomy; 3 other surgical procedures subsequently performed  . LAPAROSCOPIC CHOLECYSTECTOMY    . SPINE SURGERY  05-23-13   X's 5 surgeries  . VAGINAL HYSTERECTOMY  Current Meds  Medication Sig  . acetaminophen (TYLENOL) 650 MG CR tablet Take 650 mg by mouth See admin instructions. Take 650 mg twice daily, may take a 3rd 650 mg dose midday as needed for pain  . albuterol (VENTOLIN HFA) 108 (90 Base) MCG/ACT inhaler Inhale 2 puffs into the lungs 2 (two) times daily.  Marland Kitchen ALPRAZolam (XANAX) 0.5 MG tablet Take 0.5 mg by mouth at bedtime.   Marland Kitchen atenolol (TENORMIN) 50 MG tablet Take 1 tablet (50 mg total) by mouth 2 (two)  times daily.  . Carboxymethylcellul-Glycerin (LUBRICATING EYE DROPS OP) Place 2 drops into both eyes daily as needed (dry eyes).  . chlorthalidone (HYGROTON) 25 MG tablet Take 25 mg by mouth daily.   . diclofenac Sodium (VOLTAREN) 1 % GEL Apply 2 g topically 2 (two) times daily as needed.  . dicyclomine (BENTYL) 10 MG capsule Take 1 capsule (10 mg total) by mouth 4 (four) times daily as needed (diarrhea, abdominal discomfort).  Marland Kitchen diltiazem (CARDIZEM) 30 MG tablet Take 1 tablet by mouth as needed. At onset of rapid heart rate  . ferrous sulfate 325 (65 FE) MG tablet Take 325 mg by mouth daily with breakfast.  . gabapentin (NEURONTIN) 300 MG capsule Take 300 mg by mouth 2 (two) times daily.   Marland Kitchen glipiZIDE (GLUCOTROL XL) 5 MG 24 hr tablet Take 5 mg by mouth 2 (two) times daily.   Marland Kitchen levothyroxine (SYNTHROID, LEVOTHROID) 50 MCG tablet Take 50 mcg by mouth daily.  Marland Kitchen loratadine (CLARITIN) 10 MG tablet Take 10 mg by mouth daily.  Marland Kitchen losartan (COZAAR) 50 MG tablet Take 50 mg by mouth daily.  . Magnesium 250 MG TABS Take 250 mg by mouth 2 (two) times a day.   . meclizine (ANTIVERT) 25 MG tablet Take 25 mg 3 (three) times daily as needed by mouth for dizziness.  Marland Kitchen omeprazole (PRILOSEC) 20 MG capsule Take 20 mg by mouth daily.  . potassium chloride SA (K-DUR,KLOR-CON) 20 MEQ tablet Take 2 tablets (40 mEq total) by mouth every morning. & 20 meq in the afternoon (Patient taking differently: Take 40 mEq by mouth every morning. & 20 meq in the bedtime)  . pravastatin (PRAVACHOL) 20 MG tablet Take 20 mg by mouth at bedtime.  . rivaroxaban (XARELTO) 20 MG TABS tablet Take 1 tablet (20 mg total) by mouth daily with supper.  . trolamine salicylate (ASPERCREME) 10 % cream Apply 1 application topically 2 (two) times daily.     Allergies:   Patient has no known allergies.   Social History   Tobacco Use  . Smoking status: Former Smoker    Packs/day: 0.25    Years: 43.00    Pack years: 10.75    Types: Cigarettes     Start date: 03/10/1965    Quit date: 11/06/2008    Years since quitting: 11.0  . Smokeless tobacco: Never Used  Substance Use Topics  . Alcohol use: Never    Alcohol/week: 1.0 standard drinks    Types: 1 Standard drinks or equivalent per week    Frequency: Never  . Drug use: Never     Family Hx: The patient's family history includes Cancer in her brother and sister; Cancer - Prostate in her father; Diabetes in her sister; Heart attack in her father and mother; Heart disease in her father and mother; Hyperlipidemia in her brother, father, mother, sister, and sister; Hypertension in her brother, sister, and sister; Stroke in her mother. There is no history of Colon cancer.  ROS:   Please see the history of present illness. All other systems reviewed and are negative.   Prior CV studies:   The following studies were reviewed today:  Carotid Dopplers1/07/2019: Summary: Right Carotid: Velocities in the right ICA are consistent with a 60-79% stenosis.  Left Carotid: Velocities in the left ICA are consistent with a 40-59% stenosis.  Vertebrals: Bilateral vertebral arteries demonstrate antegrade flow. Subclavians: Normal flow hemodynamics were seen in bilateral subclavian arteries.  Labs/Other Tests and Data Reviewed:    EKG:  An ECG dated 02/18/2019 was personally reviewed today and demonstrated:  Atrial fibrillation with RVR.  Recent Labs:  February 2020: BUN 15, creatinine 0.69, potassium 3.2, magnesium 1.5, troponin T less than 0.012, hemoglobin 11.1, platelets 273, UDS negative June 2020: Hemoglobin 12.1, platelets 285, BUN 20, creatinine 0.77, potassium 4.3, cholesterol 194, triglycerides 196, HDL 50, LDL 105, hemoglobin A1c 6.6%  Wt Readings from Last 3 Encounters:  11/15/19 220 lb (99.8 kg)  02/26/19 216 lb (98 kg)  01/03/19 220 lb (99.8 kg)     Objective:    Vital Signs:  BP 118/80   Ht 5\' 5"  (1.651 m)   Wt 220 lb (99.8 kg)   BMI  36.61 kg/m    Patient spoke in full sentences, not short of breath, did not sound to be in any sort of distress. No audible wheezing or coughing.  Speech pattern normal.  ASSESSMENT & PLAN:    1.  Paroxysmal atrial fibrillation, CHA2DS2-VASc score of 5.  Continue Xarelto for stroke prophylaxis, I reviewed her lab work from June.  No change in present dose of atenolol and use of as needed short acting Cardizem.  2.  Essential hypertension, blood pressure is normal based on today's measurement.  No change in current regimen.  Keep follow-up with Dr. Nevada Crane.  3.  Patient reported diagnosis of COVID-19, diagnosed by PCP earlier this week.  She is quarantining at home with mild symptoms.  4.  Carotid artery disease status post previous right CEA in 2002.  She continues to follow with [facility], last carotid Dopplers were in January.  She remains on statin therapy.   Time:  Today, I have spent 5 minutes with the patient with telehealth technology discussing the above problems.     Medication Adjustments/Labs and Tests Ordered: Current medicines are reviewed at length with the patient today.  Concerns regarding medicines are outlined above.   Tests Ordered: No orders of the defined types were placed in this encounter.   Medication Changes: No orders of the defined types were placed in this encounter.   Follow Up:  In Person 6 months in the [place] office.  Signed, Rozann Lesches, MD  11/15/2019 4:24 PM    [employer]

## 2019-11-15 NOTE — Patient Instructions (Addendum)

## 2019-11-16 ENCOUNTER — Other Ambulatory Visit: Payer: Self-pay

## 2019-11-16 DIAGNOSIS — I6523 Occlusion and stenosis of bilateral carotid arteries: Secondary | ICD-10-CM

## 2019-11-21 ENCOUNTER — Ambulatory Visit: Payer: Medicare Other | Admitting: Vascular Surgery

## 2019-11-21 ENCOUNTER — Encounter (HOSPITAL_COMMUNITY): Payer: Medicare Other

## 2019-12-10 ENCOUNTER — Telehealth: Payer: Self-pay | Admitting: Cardiology

## 2019-12-10 DIAGNOSIS — I482 Chronic atrial fibrillation, unspecified: Secondary | ICD-10-CM | POA: Diagnosis not present

## 2019-12-10 DIAGNOSIS — E119 Type 2 diabetes mellitus without complications: Secondary | ICD-10-CM | POA: Diagnosis not present

## 2019-12-10 DIAGNOSIS — E782 Mixed hyperlipidemia: Secondary | ICD-10-CM | POA: Diagnosis not present

## 2019-12-10 DIAGNOSIS — M130 Polyarthritis, unspecified: Secondary | ICD-10-CM | POA: Diagnosis not present

## 2019-12-10 DIAGNOSIS — I10 Essential (primary) hypertension: Secondary | ICD-10-CM | POA: Diagnosis not present

## 2019-12-10 NOTE — Telephone Encounter (Signed)
Patient calling the office for samples of medication:   1.  What medication and dosage are you requesting samples for?  Xarelto 20 mg   2.  Are you currently out of this medication?    Patient has to go to work. She requested to leave a message if she doesn't answer.

## 2019-12-10 NOTE — Telephone Encounter (Signed)
Detailed voice message left for patient - currently do not have any Xarelto samples.  We have called rep to request - may call back in a few days to see if have any in &/or may contact pmd to see if they have any they are willing to give.

## 2019-12-17 DIAGNOSIS — M170 Bilateral primary osteoarthritis of knee: Secondary | ICD-10-CM | POA: Diagnosis not present

## 2019-12-25 DIAGNOSIS — M67813 Other specified disorders of tendon, right shoulder: Secondary | ICD-10-CM | POA: Diagnosis not present

## 2019-12-25 DIAGNOSIS — M19012 Primary osteoarthritis, left shoulder: Secondary | ICD-10-CM | POA: Diagnosis not present

## 2019-12-25 DIAGNOSIS — M19011 Primary osteoarthritis, right shoulder: Secondary | ICD-10-CM | POA: Diagnosis not present

## 2019-12-25 DIAGNOSIS — M67814 Other specified disorders of tendon, left shoulder: Secondary | ICD-10-CM | POA: Diagnosis not present

## 2020-01-02 ENCOUNTER — Encounter (HOSPITAL_COMMUNITY): Payer: Medicare Other

## 2020-01-02 ENCOUNTER — Ambulatory Visit: Payer: Medicare Other | Admitting: Vascular Surgery

## 2020-02-13 ENCOUNTER — Encounter (HOSPITAL_COMMUNITY): Payer: Medicare Other

## 2020-02-13 ENCOUNTER — Ambulatory Visit: Payer: Medicare Other | Admitting: Vascular Surgery

## 2020-03-02 ENCOUNTER — Ambulatory Visit: Payer: Self-pay

## 2020-03-02 DIAGNOSIS — Z23 Encounter for immunization: Secondary | ICD-10-CM | POA: Diagnosis not present

## 2020-03-03 DIAGNOSIS — E119 Type 2 diabetes mellitus without complications: Secondary | ICD-10-CM | POA: Diagnosis not present

## 2020-03-03 DIAGNOSIS — Z7901 Long term (current) use of anticoagulants: Secondary | ICD-10-CM | POA: Diagnosis not present

## 2020-03-03 DIAGNOSIS — R197 Diarrhea, unspecified: Secondary | ICD-10-CM | POA: Diagnosis not present

## 2020-03-03 DIAGNOSIS — H8110 Benign paroxysmal vertigo, unspecified ear: Secondary | ICD-10-CM | POA: Diagnosis not present

## 2020-03-03 DIAGNOSIS — E739 Lactose intolerance, unspecified: Secondary | ICD-10-CM | POA: Diagnosis not present

## 2020-03-03 DIAGNOSIS — M25519 Pain in unspecified shoulder: Secondary | ICD-10-CM | POA: Diagnosis not present

## 2020-03-03 DIAGNOSIS — I10 Essential (primary) hypertension: Secondary | ICD-10-CM | POA: Diagnosis not present

## 2020-03-03 DIAGNOSIS — Z8669 Personal history of other diseases of the nervous system and sense organs: Secondary | ICD-10-CM | POA: Diagnosis not present

## 2020-03-06 DIAGNOSIS — E782 Mixed hyperlipidemia: Secondary | ICD-10-CM | POA: Diagnosis not present

## 2020-03-06 DIAGNOSIS — E039 Hypothyroidism, unspecified: Secondary | ICD-10-CM | POA: Diagnosis not present

## 2020-03-06 DIAGNOSIS — I480 Paroxysmal atrial fibrillation: Secondary | ICD-10-CM | POA: Diagnosis not present

## 2020-03-06 DIAGNOSIS — K219 Gastro-esophageal reflux disease without esophagitis: Secondary | ICD-10-CM | POA: Diagnosis not present

## 2020-03-06 DIAGNOSIS — I10 Essential (primary) hypertension: Secondary | ICD-10-CM | POA: Diagnosis not present

## 2020-03-12 ENCOUNTER — Other Ambulatory Visit: Payer: Self-pay | Admitting: Internal Medicine

## 2020-03-12 ENCOUNTER — Other Ambulatory Visit (HOSPITAL_COMMUNITY): Payer: Self-pay | Admitting: Internal Medicine

## 2020-03-12 DIAGNOSIS — I739 Peripheral vascular disease, unspecified: Secondary | ICD-10-CM

## 2020-03-18 ENCOUNTER — Ambulatory Visit (HOSPITAL_COMMUNITY)
Admission: RE | Admit: 2020-03-18 | Discharge: 2020-03-18 | Disposition: A | Discharge status: Home / Self Care | Payer: Medicare Other | Source: Ambulatory Visit | Attending: Internal Medicine | Admitting: Internal Medicine

## 2020-03-18 ENCOUNTER — Other Ambulatory Visit: Payer: Self-pay

## 2020-03-18 DIAGNOSIS — I739 Peripheral vascular disease, unspecified: Secondary | ICD-10-CM | POA: Insufficient documentation

## 2020-03-18 DIAGNOSIS — M79662 Pain in left lower leg: Secondary | ICD-10-CM | POA: Diagnosis not present

## 2020-03-18 DIAGNOSIS — M79661 Pain in right lower leg: Secondary | ICD-10-CM | POA: Diagnosis not present

## 2020-03-20 DIAGNOSIS — M170 Bilateral primary osteoarthritis of knee: Secondary | ICD-10-CM | POA: Diagnosis not present

## 2020-03-26 ENCOUNTER — Other Ambulatory Visit: Payer: Self-pay

## 2020-03-26 ENCOUNTER — Ambulatory Visit: Payer: Medicare Other | Admitting: Vascular Surgery

## 2020-03-26 ENCOUNTER — Ambulatory Visit (HOSPITAL_COMMUNITY)
Admission: RE | Admit: 2020-03-26 | Discharge: 2020-03-26 | Disposition: A | Discharge status: Home / Self Care | Payer: Medicare Other | Source: Ambulatory Visit | Attending: Vascular Surgery | Admitting: Vascular Surgery

## 2020-03-26 ENCOUNTER — Encounter: Payer: Self-pay | Admitting: Vascular Surgery

## 2020-03-26 VITALS — BP 148/67 | HR 59 | Temp 97.6°F | Resp 20 | Ht 65.0 in | Wt 220.0 lb

## 2020-03-26 DIAGNOSIS — I6523 Occlusion and stenosis of bilateral carotid arteries: Secondary | ICD-10-CM | POA: Diagnosis not present

## 2020-03-26 NOTE — Progress Notes (Signed)
Patient name: Martha Torres MRN: [id number] DOB: 1946-09-11 Sex: female  REASON FOR VISIT:   Follow-up of carotid disease.  HPI:   Martha Torres is a pleasant 74 y.o. female who I last saw in January 2020.  This patient had undergone a right carotid endarterectomy by Dr. Amedeo Plenty in 2002.  Patient had a known 18 to 59% left carotid stenosis.  She was asymptomatic.  She was on Xarelto for atrial fibrillation and for this reason was not on aspirin.  She was on a statin.  When I saw her last she had a recurrent 60 to 79% right carotid stenosis and a 40 to 59% left carotid stenosis.  Both vertebral arteries were patent with antegrade flow.  She was to come back in 6 months for a follow-up visit.  She did contract Covid and her follow-up visit was delayed.  She is followed by Dr. Domenic Polite with her atrial fibrillation.  Since I saw her last she denies any history of stroke, TIAs, expressive or receptive aphasia, or amaurosis fugax.  She is on Xarelto for atrial fibrillation and for this reason has not been on aspirin.  She is on a statin.  She did have some leg swelling and underwent a noninvasive study in [place].  I do not have those results.  I do not get any history of claudication or rest pain.  Past Medical History:  Diagnosis Date  . Carotid artery occlusion    Right CEA in 2002  . Chest pain 2006   Stress nuclear-anteroapical and basilar inferior reversible defects; cath-normal coronary arteries and EF  . Chronic low back pain    MRI 2014: Spondylolisthesis of L4-L5 with foraminal narrowing, most prominent on the right and facet arthropathy.  L4-L5 surgery planned for 04/2013 with left-sided pedicle screw fixation.  . Degenerative joint disease   . DVT (deep venous thrombosis) ([place])   . Essential hypertension   . Gastroesophageal reflux    H/o stricture & hiatal hernia  . Hyperlipidemia   . MRSA (methicillin resistant Staphylococcus aureus) June 2014  . Paroxysmal atrial  fibrillation ([place]) 04/2013  . Type 2 diabetes mellitus ([facility])     Family History  Problem Relation Age of Onset  . Heart attack Father        Also mother  . Cancer - Prostate Father   . Heart disease Father        Heart Disease before age 74  . Hyperlipidemia Father   . Hypertension Sister   . Hyperlipidemia Sister   . Cancer Sister   . Stroke Mother   . Heart disease Mother        Heart Disease before age 31  . Hyperlipidemia Mother   . Heart attack Mother   . Diabetes Sister   . Hyperlipidemia Sister   . Hypertension Sister   . Hyperlipidemia Brother   . Cancer Brother        Luekemia  . Hypertension Brother   . Colon cancer Neg Hx     SOCIAL HISTORY: Social History   Tobacco Use  . Smoking status: Former Smoker    Packs/day: 0.25    Years: 43.00    Pack years: 10.75    Types: Cigarettes    Start date: 03/10/1965    Quit date: 11/06/2008    Years since quitting: 11.3  . Smokeless tobacco: Never Used  Substance Use Topics  . Alcohol use: Never    Alcohol/week: 1.0 standard drinks    Types:  1 Standard drinks or equivalent per week    No Known Allergies  Current Outpatient Medications  Medication Sig Dispense Refill  . acetaminophen (TYLENOL) 650 MG CR tablet Take 650 mg by mouth See admin instructions. Take 650 mg twice daily, may take a 3rd 650 mg dose midday as needed for pain    . albuterol (VENTOLIN HFA) 108 (90 Base) MCG/ACT inhaler Inhale 2 puffs into the lungs 2 (two) times daily.    Marland Kitchen ALPRAZolam (XANAX) 0.5 MG tablet Take 0.5 mg by mouth at bedtime.     Marland Kitchen atenolol (TENORMIN) 50 MG tablet Take 1 tablet (50 mg total) by mouth 2 (two) times daily. 180 tablet 1  . Carboxymethylcellul-Glycerin (LUBRICATING EYE DROPS OP) Place 2 drops into both eyes daily as needed (dry eyes).    . chlorthalidone (HYGROTON) 25 MG tablet Take 25 mg by mouth daily.     . diclofenac Sodium (VOLTAREN) 1 % GEL Apply 2 g topically 2 (two) times daily as needed.    . dicyclomine  (BENTYL) 10 MG capsule Take 1 capsule (10 mg total) by mouth 4 (four) times daily as needed (diarrhea, abdominal discomfort). 90 capsule 2  . diltiazem (CARDIZEM) 30 MG tablet Take 1 tablet by mouth as needed. At onset of rapid heart rate    . ferrous sulfate 325 (65 FE) MG tablet Take 325 mg by mouth daily with breakfast.    . gabapentin (NEURONTIN) 300 MG capsule Take 300 mg by mouth 2 (two) times daily.     Marland Kitchen glipiZIDE (GLUCOTROL XL) 5 MG 24 hr tablet Take 5 mg by mouth 2 (two) times daily.     Marland Kitchen levothyroxine (SYNTHROID, LEVOTHROID) 50 MCG tablet Take 50 mcg by mouth daily.    Marland Kitchen loratadine (CLARITIN) 10 MG tablet Take 10 mg by mouth daily.    Marland Kitchen losartan (COZAAR) 50 MG tablet Take 50 mg by mouth daily.    . Magnesium 250 MG TABS Take 250 mg by mouth 2 (two) times a day.     . meclizine (ANTIVERT) 25 MG tablet Take 25 mg 3 (three) times daily as needed by mouth for dizziness.    Marland Kitchen omeprazole (PRILOSEC) 20 MG capsule Take 20 mg by mouth daily.    . potassium chloride SA (K-DUR,KLOR-CON) 20 MEQ tablet Take 2 tablets (40 mEq total) by mouth every morning. & 20 meq in the afternoon (Patient taking differently: Take 40 mEq by mouth every morning. & 20 meq in the bedtime) 270 tablet 3  . pravastatin (PRAVACHOL) 20 MG tablet Take 20 mg by mouth at bedtime.    . rivaroxaban (XARELTO) 20 MG TABS tablet Take 1 tablet (20 mg total) by mouth daily with supper. 28 tablet 0  . rosuvastatin (CRESTOR) 10 MG tablet Take 10 mg by mouth at bedtime.    . trolamine salicylate (ASPERCREME) 10 % cream Apply 1 application topically 2 (two) times daily.     No current facility-administered medications for this visit.    REVIEW OF SYSTEMS:  [X]  denotes positive finding, [ ]  denotes negative finding Cardiac  Comments:  Chest pain or chest pressure:    Shortness of breath upon exertion:    Short of breath when lying flat:    Irregular heart rhythm:        Vascular    Pain in calf, thigh, or hip brought on by  ambulation:    Pain in feet at night that wakes you up from your sleep:  x  Blood clot in your veins:    Leg swelling:  x       Pulmonary    Oxygen at home:    Productive cough:     Wheezing:         Neurologic    Sudden weakness in arms or legs:     Sudden numbness in arms or legs:     Sudden onset of difficulty speaking or slurred speech:    Temporary loss of vision in one eye:     Problems with dizziness:         Gastrointestinal    Blood in stool:     Vomited blood:         Genitourinary    Burning when urinating:     Blood in urine:        Psychiatric    Major depression:         Hematologic    Bleeding problems:    Problems with blood clotting too easily:        Skin    Rashes or ulcers:        Constitutional    Fever or chills:     PHYSICAL EXAM:   Vitals:   03/26/20 0907 03/26/20 0910  BP: 131/73 (!) 148/67  Pulse: (!) 59   Resp: 20   Temp: 97.6 F (36.4 C)   SpO2: 99%   Weight: 220 lb (99.8 kg)   Height: 5\' 5"  (1.651 m)    Body mass index is 36.61 kg/m.  GENERAL: The patient is a well-nourished female, in no acute distress. The vital signs are documented above. CARDIAC: There is a regular rate and rhythm.  VASCULAR: I do not detect carotid bruits. She has palpable posterior tibial pulses bilaterally. She has biphasic posterior tibial and dorsalis pedis signals bilaterally. She has mild bilateral lower extremity swelling. PULMONARY: There is good air exchange bilaterally without wheezing or rales. ABDOMEN: Soft and non-tender with normal pitched bowel sounds.  It is difficult to assess for aneurysm given her size. MUSCULOSKELETAL: There are no major deformities or cyanosis. NEUROLOGIC: No focal weakness or paresthesias are detected. SKIN: There are no ulcers or rashes noted. PSYCHIATRIC: The patient has a normal affect.  DATA:    CAROTID DUPLEX: I have independently interpreted her carotid duplex scan today.   On the right side, the  recurrent carotid stenosis has progressed to greater than 80%.  There are areas where it is difficult to visualize flow in the carotid.  The right vertebral artery is patent with antegrade flow.  On the left side there is a 40 to 59% stenosis which is stable.  LABS: I reviewed her labs from 10/06/2018 at that time she had a normal GFR.  I do not see any more recent labs.  MEDICAL ISSUES:   BILATERAL CAROTID DISEASE: This patient had a right carotid endarterectomy by Dr. Amedeo Plenty in 2002.  She is developed a greater than 80% recurrent right carotid stenosis.  She is asymptomatic.  On the left side she has a 40 to 59% stenosis.  Given the progression of her right carotid stenosis I have recommended a CT angiogram of the neck and head to determine if she might be a candidate for a TCAR.  I have also instructed her to begin taking 81 mg of aspirin daily.  She is on a statin.  If she is not a candidate for TCAR we would have to consider redo right carotid endarterectomy which is associated with a  slightly higher risk of nerve injury and bleeding.  I will make further recommendations pending the results of her CT angiogram.  Of note we will need to hold her Xarelto for 48 hours prior to any intervention.  Deitra Mayo [facility] [phone]

## 2020-03-26 NOTE — H&P (View-Only) (Signed)
Patient name: Martha Torres MRN: [id number] DOB: 1946-11-20 Sex: female  REASON FOR VISIT:   Follow-up of carotid disease.  HPI:   Martha Torres is a pleasant 74 y.o. female who I last saw in January 2020.  This patient had undergone a right carotid endarterectomy by Dr. Amedeo Plenty in 2002.  Patient had a known 75 to 59% left carotid stenosis.  She was asymptomatic.  She was on Xarelto for atrial fibrillation and for this reason was not on aspirin.  She was on a statin.  When I saw her last she had a recurrent 60 to 79% right carotid stenosis and a 40 to 59% left carotid stenosis.  Both vertebral arteries were patent with antegrade flow.  She was to come back in 6 months for a follow-up visit.  She did contract Covid and her follow-up visit was delayed.  She is followed by Dr. Domenic Polite with her atrial fibrillation.  Since I saw her last she denies any history of stroke, TIAs, expressive or receptive aphasia, or amaurosis fugax.  She is on Xarelto for atrial fibrillation and for this reason has not been on aspirin.  She is on a statin.  She did have some leg swelling and underwent a noninvasive study in [place].  I do not have those results.  I do not get any history of claudication or rest pain.  Past Medical History:  Diagnosis Date  . Carotid artery occlusion    Right CEA in 2002  . Chest pain 2006   Stress nuclear-anteroapical and basilar inferior reversible defects; cath-normal coronary arteries and EF  . Chronic low back pain    MRI 2014: Spondylolisthesis of L4-L5 with foraminal narrowing, most prominent on the right and facet arthropathy.  L4-L5 surgery planned for 04/2013 with left-sided pedicle screw fixation.  . Degenerative joint disease   . DVT (deep venous thrombosis) ([place])   . Essential hypertension   . Gastroesophageal reflux    H/o stricture & hiatal hernia  . Hyperlipidemia   . MRSA (methicillin resistant Staphylococcus aureus) June 2014  . Paroxysmal atrial  fibrillation ([place]) 04/2013  . Type 2 diabetes mellitus ([facility])     Family History  Problem Relation Age of Onset  . Heart attack Father        Also mother  . Cancer - Prostate Father   . Heart disease Father        Heart Disease before age 1  . Hyperlipidemia Father   . Hypertension Sister   . Hyperlipidemia Sister   . Cancer Sister   . Stroke Mother   . Heart disease Mother        Heart Disease before age 32  . Hyperlipidemia Mother   . Heart attack Mother   . Diabetes Sister   . Hyperlipidemia Sister   . Hypertension Sister   . Hyperlipidemia Brother   . Cancer Brother        Luekemia  . Hypertension Brother   . Colon cancer Neg Hx     SOCIAL HISTORY: Social History   Tobacco Use  . Smoking status: Former Smoker    Packs/day: 0.25    Years: 43.00    Pack years: 10.75    Types: Cigarettes    Start date: 03/10/1965    Quit date: 11/06/2008    Years since quitting: 11.3  . Smokeless tobacco: Never Used  Substance Use Topics  . Alcohol use: Never    Alcohol/week: 1.0 standard drinks    Types:  1 Standard drinks or equivalent per week    No Known Allergies  Current Outpatient Medications  Medication Sig Dispense Refill  . acetaminophen (TYLENOL) 650 MG CR tablet Take 650 mg by mouth See admin instructions. Take 650 mg twice daily, may take a 3rd 650 mg dose midday as needed for pain    . albuterol (VENTOLIN HFA) 108 (90 Base) MCG/ACT inhaler Inhale 2 puffs into the lungs 2 (two) times daily.    Marland Kitchen ALPRAZolam (XANAX) 0.5 MG tablet Take 0.5 mg by mouth at bedtime.     Marland Kitchen atenolol (TENORMIN) 50 MG tablet Take 1 tablet (50 mg total) by mouth 2 (two) times daily. 180 tablet 1  . Carboxymethylcellul-Glycerin (LUBRICATING EYE DROPS OP) Place 2 drops into both eyes daily as needed (dry eyes).    . chlorthalidone (HYGROTON) 25 MG tablet Take 25 mg by mouth daily.     . diclofenac Sodium (VOLTAREN) 1 % GEL Apply 2 g topically 2 (two) times daily as needed.    . dicyclomine  (BENTYL) 10 MG capsule Take 1 capsule (10 mg total) by mouth 4 (four) times daily as needed (diarrhea, abdominal discomfort). 90 capsule 2  . diltiazem (CARDIZEM) 30 MG tablet Take 1 tablet by mouth as needed. At onset of rapid heart rate    . ferrous sulfate 325 (65 FE) MG tablet Take 325 mg by mouth daily with breakfast.    . gabapentin (NEURONTIN) 300 MG capsule Take 300 mg by mouth 2 (two) times daily.     Marland Kitchen glipiZIDE (GLUCOTROL XL) 5 MG 24 hr tablet Take 5 mg by mouth 2 (two) times daily.     Marland Kitchen levothyroxine (SYNTHROID, LEVOTHROID) 50 MCG tablet Take 50 mcg by mouth daily.    Marland Kitchen loratadine (CLARITIN) 10 MG tablet Take 10 mg by mouth daily.    Marland Kitchen losartan (COZAAR) 50 MG tablet Take 50 mg by mouth daily.    . Magnesium 250 MG TABS Take 250 mg by mouth 2 (two) times a day.     . meclizine (ANTIVERT) 25 MG tablet Take 25 mg 3 (three) times daily as needed by mouth for dizziness.    Marland Kitchen omeprazole (PRILOSEC) 20 MG capsule Take 20 mg by mouth daily.    . potassium chloride SA (K-DUR,KLOR-CON) 20 MEQ tablet Take 2 tablets (40 mEq total) by mouth every morning. & 20 meq in the afternoon (Patient taking differently: Take 40 mEq by mouth every morning. & 20 meq in the bedtime) 270 tablet 3  . pravastatin (PRAVACHOL) 20 MG tablet Take 20 mg by mouth at bedtime.    . rivaroxaban (XARELTO) 20 MG TABS tablet Take 1 tablet (20 mg total) by mouth daily with supper. 28 tablet 0  . rosuvastatin (CRESTOR) 10 MG tablet Take 10 mg by mouth at bedtime.    . trolamine salicylate (ASPERCREME) 10 % cream Apply 1 application topically 2 (two) times daily.     No current facility-administered medications for this visit.    REVIEW OF SYSTEMS:  [X]  denotes positive finding, [ ]  denotes negative finding Cardiac  Comments:  Chest pain or chest pressure:    Shortness of breath upon exertion:    Short of breath when lying flat:    Irregular heart rhythm:        Vascular    Pain in calf, thigh, or hip brought on by  ambulation:    Pain in feet at night that wakes you up from your sleep:  x  Blood clot in your veins:    Leg swelling:  x       Pulmonary    Oxygen at home:    Productive cough:     Wheezing:         Neurologic    Sudden weakness in arms or legs:     Sudden numbness in arms or legs:     Sudden onset of difficulty speaking or slurred speech:    Temporary loss of vision in one eye:     Problems with dizziness:         Gastrointestinal    Blood in stool:     Vomited blood:         Genitourinary    Burning when urinating:     Blood in urine:        Psychiatric    Major depression:         Hematologic    Bleeding problems:    Problems with blood clotting too easily:        Skin    Rashes or ulcers:        Constitutional    Fever or chills:     PHYSICAL EXAM:   Vitals:   03/26/20 0907 03/26/20 0910  BP: 131/73 (!) 148/67  Pulse: (!) 59   Resp: 20   Temp: 97.6 F (36.4 C)   SpO2: 99%   Weight: 220 lb (99.8 kg)   Height: 5\' 5"  (1.651 m)    Body mass index is 36.61 kg/m.  GENERAL: The patient is a well-nourished female, in no acute distress. The vital signs are documented above. CARDIAC: There is a regular rate and rhythm.  VASCULAR: I do not detect carotid bruits. She has palpable posterior tibial pulses bilaterally. She has biphasic posterior tibial and dorsalis pedis signals bilaterally. She has mild bilateral lower extremity swelling. PULMONARY: There is good air exchange bilaterally without wheezing or rales. ABDOMEN: Soft and non-tender with normal pitched bowel sounds.  It is difficult to assess for aneurysm given her size. MUSCULOSKELETAL: There are no major deformities or cyanosis. NEUROLOGIC: No focal weakness or paresthesias are detected. SKIN: There are no ulcers or rashes noted. PSYCHIATRIC: The patient has a normal affect.  DATA:    CAROTID DUPLEX: I have independently interpreted her carotid duplex scan today.   On the right side, the  recurrent carotid stenosis has progressed to greater than 80%.  There are areas where it is difficult to visualize flow in the carotid.  The right vertebral artery is patent with antegrade flow.  On the left side there is a 40 to 59% stenosis which is stable.  LABS: I reviewed her labs from 10/06/2018 at that time she had a normal GFR.  I do not see any more recent labs.  MEDICAL ISSUES:   BILATERAL CAROTID DISEASE: This patient had a right carotid endarterectomy by Dr. Amedeo Plenty in 2002.  She is developed a greater than 80% recurrent right carotid stenosis.  She is asymptomatic.  On the left side she has a 40 to 59% stenosis.  Given the progression of her right carotid stenosis I have recommended a CT angiogram of the neck and head to determine if she might be a candidate for a TCAR.  I have also instructed her to begin taking 81 mg of aspirin daily.  She is on a statin.  If she is not a candidate for TCAR we would have to consider redo right carotid endarterectomy which is associated with a  slightly higher risk of nerve injury and bleeding.  I will make further recommendations pending the results of her CT angiogram.  Of note we will need to hold her Xarelto for 48 hours prior to any intervention.  Deitra Mayo [facility] [phone]

## 2020-03-31 ENCOUNTER — Other Ambulatory Visit: Payer: Self-pay

## 2020-03-31 ENCOUNTER — Ambulatory Visit (HOSPITAL_COMMUNITY)
Admission: RE | Admit: 2020-03-31 | Discharge: 2020-03-31 | Disposition: A | Discharge status: Home / Self Care | Payer: Medicare Other | Source: Ambulatory Visit | Attending: Vascular Surgery | Admitting: Vascular Surgery

## 2020-03-31 DIAGNOSIS — I6523 Occlusion and stenosis of bilateral carotid arteries: Secondary | ICD-10-CM | POA: Insufficient documentation

## 2020-03-31 LAB — POCT I-STAT CREATININE: Creatinine, Ser: 0.8 mg/dL (ref 0.44–1.00)

## 2020-03-31 MED ORDER — IOHEXOL 350 MG/ML SOLN
80.0000 mL | Freq: Once | INTRAVENOUS | Status: AC | PRN
  Administered 2020-03-31: 80 mL via INTRAVENOUS

## 2020-04-01 ENCOUNTER — Telehealth: Payer: Self-pay

## 2020-04-01 NOTE — Telephone Encounter (Signed)
Spoke to pt who called regarding her CT scan results/plan moving forward. I have let Dr. Scot Dock know she called.

## 2020-04-07 ENCOUNTER — Other Ambulatory Visit: Payer: Self-pay

## 2020-04-07 DIAGNOSIS — I6523 Occlusion and stenosis of bilateral carotid arteries: Secondary | ICD-10-CM

## 2020-04-07 MED ORDER — CLOPIDOGREL BISULFATE 75 MG PO TABS: 75.0000 mg | ORAL_TABLET | Freq: Every day | ORAL | 0 refills | Status: DC

## 2020-04-08 ENCOUNTER — Encounter (INDEPENDENT_AMBULATORY_CARE_PROVIDER_SITE_OTHER): Payer: Self-pay | Admitting: Otolaryngology

## 2020-04-08 ENCOUNTER — Ambulatory Visit (INDEPENDENT_AMBULATORY_CARE_PROVIDER_SITE_OTHER): Payer: Medicare Other | Admitting: Otolaryngology

## 2020-04-08 ENCOUNTER — Other Ambulatory Visit: Payer: Self-pay

## 2020-04-08 VITALS — Temp 97.9°F

## 2020-04-08 DIAGNOSIS — D110 Benign neoplasm of parotid gland: Secondary | ICD-10-CM

## 2020-04-08 NOTE — Progress Notes (Signed)
HPI: Martha Torres is a 74 y.o. female who presents is referred by her PCP for evaluation of left parotid mass that was initially diagnosed with a CTA of the neck performed 1 week ago.  On discussion with the patient she has known that she has had a lump in the left parotid gland for close to 20 years.  She initially was told that it was a cyst.  It is nontender and has not significantly changed in size over the past few years. 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  No adenopathy noted in the neck. She is scheduled for surgery next week.  Past Medical History:  Diagnosis Date  . Carotid artery occlusion    Right CEA in 2002  . Chest pain 2006   Stress nuclear-anteroapical and basilar inferior reversible defects; cath-normal coronary arteries and EF  . Chronic low back pain    MRI 2014: Spondylolisthesis of L4-L5 with foraminal narrowing, most prominent on the right and facet arthropathy.  L4-L5 surgery planned for 04/2013 with left-sided pedicle screw fixation.  . Degenerative joint disease   . DVT (deep venous thrombosis) ([place])   . Essential hypertension   . Gastroesophageal reflux    H/o stricture & hiatal hernia  . Hyperlipidemia   . MRSA (methicillin resistant Staphylococcus aureus) June 2014  . Paroxysmal atrial fibrillation ([place]) 04/2013  . Type 2 diabetes mellitus ([place])    Past Surgical History:  Procedure Laterality Date  . BIOPSY  02/26/2019   Procedure: BIOPSY;  Surgeon: Danie Binder, MD;  Location: [facility];  Service: Endoscopy;;  random colon bx's  . CAROTID ENDARTERECTOMY Right 10/25/2001   Subsequent to episode of syncope  . COLONOSCOPY N/A 02/26/2019   Procedure: COLONOSCOPY;  Surgeon: Danie Binder, MD;  Location: [facility];  Service: Endoscopy;  Laterality: N/A;  8:30am  . LAMINECTOMY  2000   L5-S1 discectomy and laminectomy; 3 other surgical procedures  subsequently performed  . LAPAROSCOPIC CHOLECYSTECTOMY    . SPINE SURGERY  05-23-13   X's 5 surgeries  . VAGINAL HYSTERECTOMY     Social History   Socioeconomic History  . Marital status: Widowed    Spouse name: Not on file  . Number of children: 3  . Years of education: Not on file  . Highest education level: Not on file  Occupational History  . 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 Smoking status: Former Smoker    Packs/day: 0.25    Years: 43.00    Pack years: 10.75    Types: Cigarettes    Start date: 03/10/1965    Quit date: 11/06/2008    Years since quitting: 11.4  . Smokeless tobacco: Never Used  Substance and Sexual Activity  . Alcohol use: Never    Alcohol/week: 1.0 standard drinks    Types: 1 Standard drinks or equivalent per week  . Drug use: Never  . Sexual activity: Not on file  Other Topics Concern  . Not on file  Social History Narrative  . Not on file   Social Determinants of Health   Financial Resource Strain:   . Difficulty of Paying Living Expenses:   Food Insecurity:   . Worried About Charity fundraiser in the Last Year:   . Arboriculturist in the Last Year:   Transportation Needs:   .  Lack of Transportation (Medical):   Marland Kitchen Lack of Transportation (Non-Medical):   Physical Activity:   . Days of Exercise per Week:   . Minutes of Exercise per Session:   Stress:   . Feeling of Stress :   Social Connections:   . Frequency of Communication with Friends and Family:   . Frequency of Social Gatherings with Friends and Family:   . Attends Religious Services:   . Active Member of Clubs or Organizations:   . Attends Archivist Meetings:   Marland Kitchen Marital Status:    Family History  Problem Relation Age of Onset  . Heart attack Father        Also mother  . Cancer - Prostate Father   . Heart disease Father        Heart Disease before age 27  . Hyperlipidemia Father   .  Hypertension Sister   . Hyperlipidemia Sister   . Cancer Sister   . Stroke Mother   . Heart disease Mother        Heart Disease before age 68  . Hyperlipidemia Mother   . Heart attack Mother   . Diabetes Sister   . Hyperlipidemia Sister   . Hypertension Sister   . Hyperlipidemia Brother   . Cancer Brother        Luekemia  . Hypertension Brother   . Colon cancer Neg Hx    No Known Allergies Prior to Admission medications   Medication Sig Start Date End Date Taking? Authorizing Provider  acetaminophen (TYLENOL) 650 MG CR tablet Take 650 mg by mouth 2 (two) times daily as needed for pain.    Yes [provider]  ALPRAZolam Duanne Moron) 0.5 MG tablet Take 0.5 mg by mouth at bedtime.    Yes [provider]  aspirin EC 81 MG tablet Take 81 mg by mouth daily.   Yes [provider]  atenolol (TENORMIN) 50 MG tablet Take 1 tablet (50 mg total) by mouth 2 (two) times daily. 09/18/18  Yes Satira Sark, MD  Carboxymethylcellul-Glycerin (LUBRICATING EYE DROPS OP) Place 2 drops into both eyes daily as needed (dry eyes).   Yes [provider]  celecoxib (CELEBREX) 200 MG capsule Take 200 mg by mouth daily.   Yes [provider]  chlorthalidone (HYGROTON) 25 MG tablet Take 25 mg by mouth daily.  05/07/13  Yes [provider]  clopidogrel (PLAVIX) 75 MG tablet Take 1 tablet (75 mg total) by mouth daily. 04/07/20  Yes Angelia Mould, MD  diclofenac Sodium (VOLTAREN) 1 % GEL Apply 2 g topically 2 (two) times daily as needed (pain).    Yes [provider]  dicyclomine (BENTYL) 10 MG capsule Take 1 capsule (10 mg total) by mouth 4 (four) times daily as needed (diarrhea, abdominal discomfort). 12/05/18  Yes Carlis Stable, NP  diltiazem (CARDIZEM) 30 MG tablet Take 30 mg by mouth 2 (two) times daily as needed (At onset of rapid heart rate).  02/21/19  Yes [provider]  gabapentin (NEURONTIN) 300 MG capsule Take 300 mg by mouth  every 8 (eight) hours.    Yes [provider]  glipiZIDE (GLUCOTROL XL) 5 MG 24 hr tablet Take 5 mg by mouth 2 (two) times daily.    Yes [provider]  levothyroxine (SYNTHROID, LEVOTHROID) 50 MCG tablet Take 50 mcg by mouth daily before breakfast.    Yes [provider]  losartan (COZAAR) 50 MG tablet Take 50 mg by mouth daily.  Yes [provider]  Magnesium 250 MG TABS Take 250 mg by mouth 2 (two) times a day.    Yes [provider]  Multiple Vitamin (MULTIVITAMIN WITH MINERALS) TABS tablet Take 1 tablet by mouth daily.   Yes [provider]  omeprazole (PRILOSEC) 20 MG capsule Take 20 mg by mouth daily.   Yes [provider]  potassium chloride SA (K-DUR,KLOR-CON) 20 MEQ tablet Take 2 tablets (40 mEq total) by mouth every morning. & 20 meq in the afternoon Patient taking differently: Take 20-40 mEq by mouth See admin instructions. 40 meq in the morning and 20 meq in the bedtime 02/27/19  Yes Satira Sark, MD  rivaroxaban (XARELTO) 20 MG TABS tablet Take 1 tablet (20 mg total) by mouth daily with supper. Patient taking differently: Take 20 mg by mouth daily.  11/12/19  Yes Satira Sark, MD  rosuvastatin (CRESTOR) 10 MG tablet Take 10 mg by mouth at bedtime. 03/06/20  Yes [provider]  trolamine salicylate (ASPERCREME) 10 % cream Apply 1 application topically 2 (two) times daily.   Yes [provider]     Positive ROS: Otherwise negative  All other systems have been reviewed and were otherwise negative with the exception of those mentioned in the HPI and as above.  Physical Exam: Constitutional: Alert, well-appearing, no acute distress Ears: External ears without lesions or tenderness. Ear canals are clear bilaterally with intact, clear TMs.  Nasal: External nose without lesions. Clear nasal passages. Oral: Lips and gums without lesions. Tongue and palate mucosa without lesions. Posterior  oropharynx clear. Neck: No palpable adenopathy or masses.  Patient has a easily palpable 2 - 2.5 cm left parotid mass that is mobile and nontender.  She has normal facial nerve function.  No palpable adenopathy lower in the neck. Respiratory: Breathing comfortably  Skin: No facial/neck lesions or rash noted.  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  Plan: Reviewed the CT scan with the patient in the office today. I discussed with her concerning possibly obtaining ultrasound-guided FNA of the parotid mass for definitive diagnosis however if this is been present for over 20 years without significant change I am not sure this is necessary.  This may warrant removal if it enlarges but it has been stable for several years.  She will call us back if she would like to schedule FNA for diagnosis of the mass.  I would recommend this if it increases in size.   Radene Journey, MD   CC:

## 2020-04-11 ENCOUNTER — Other Ambulatory Visit (HOSPITAL_COMMUNITY): Payer: Medicare Other

## 2020-04-18 ENCOUNTER — Encounter (HOSPITAL_COMMUNITY): Payer: Self-pay | Admitting: Vascular Surgery

## 2020-04-18 ENCOUNTER — Other Ambulatory Visit (HOSPITAL_COMMUNITY)
Admission: RE | Admit: 2020-04-18 | Discharge: 2020-04-18 | Disposition: A | Discharge status: Home / Self Care | Payer: Medicare Other | Source: Ambulatory Visit | Attending: Vascular Surgery | Admitting: Vascular Surgery

## 2020-04-18 ENCOUNTER — Other Ambulatory Visit: Payer: Self-pay

## 2020-04-18 DIAGNOSIS — Z20822 Contact with and (suspected) exposure to covid-19: Secondary | ICD-10-CM | POA: Insufficient documentation

## 2020-04-18 DIAGNOSIS — Z8249 Family history of ischemic heart disease and other diseases of the circulatory system: Secondary | ICD-10-CM | POA: Diagnosis not present

## 2020-04-18 DIAGNOSIS — Z86718 Personal history of other venous thrombosis and embolism: Secondary | ICD-10-CM | POA: Diagnosis not present

## 2020-04-18 DIAGNOSIS — Z01812 Encounter for preprocedural laboratory examination: Secondary | ICD-10-CM | POA: Insufficient documentation

## 2020-04-18 DIAGNOSIS — Z823 Family history of stroke: Secondary | ICD-10-CM | POA: Diagnosis not present

## 2020-04-18 DIAGNOSIS — E785 Hyperlipidemia, unspecified: Secondary | ICD-10-CM | POA: Diagnosis not present

## 2020-04-18 DIAGNOSIS — Z87442 Personal history of urinary calculi: Secondary | ICD-10-CM | POA: Diagnosis not present

## 2020-04-18 DIAGNOSIS — Z7984 Long term (current) use of oral hypoglycemic drugs: Secondary | ICD-10-CM | POA: Diagnosis not present

## 2020-04-18 DIAGNOSIS — Z79899 Other long term (current) drug therapy: Secondary | ICD-10-CM | POA: Diagnosis not present

## 2020-04-18 DIAGNOSIS — I351 Nonrheumatic aortic (valve) insufficiency: Secondary | ICD-10-CM | POA: Diagnosis not present

## 2020-04-18 DIAGNOSIS — Z7901 Long term (current) use of anticoagulants: Secondary | ICD-10-CM | POA: Diagnosis not present

## 2020-04-18 DIAGNOSIS — E119 Type 2 diabetes mellitus without complications: Secondary | ICD-10-CM | POA: Diagnosis not present

## 2020-04-18 DIAGNOSIS — I480 Paroxysmal atrial fibrillation: Secondary | ICD-10-CM | POA: Diagnosis not present

## 2020-04-18 DIAGNOSIS — I10 Essential (primary) hypertension: Secondary | ICD-10-CM | POA: Diagnosis not present

## 2020-04-18 DIAGNOSIS — Z8616 Personal history of COVID-19: Secondary | ICD-10-CM | POA: Diagnosis not present

## 2020-04-18 DIAGNOSIS — I6523 Occlusion and stenosis of bilateral carotid arteries: Secondary | ICD-10-CM | POA: Diagnosis not present

## 2020-04-18 DIAGNOSIS — K219 Gastro-esophageal reflux disease without esophagitis: Secondary | ICD-10-CM | POA: Diagnosis not present

## 2020-04-18 DIAGNOSIS — E039 Hypothyroidism, unspecified: Secondary | ICD-10-CM | POA: Diagnosis not present

## 2020-04-18 DIAGNOSIS — Z87891 Personal history of nicotine dependence: Secondary | ICD-10-CM | POA: Diagnosis not present

## 2020-04-18 DIAGNOSIS — Z833 Family history of diabetes mellitus: Secondary | ICD-10-CM | POA: Diagnosis not present

## 2020-04-18 NOTE — Progress Notes (Addendum)
Spoke with pt for pre-op call. Pt has hx of Atrial fibrillation, takes Xarelto. Was instructed to hold Xarelto 48 hours prior to surgery. Last dose was today per pt. Cardiologist is Dr. Domenic Polite. She states she had an episode of A-fib last week, but took a Diltiazem and it went it away shortly after taking it. Pt is a type 2 Diabetic. Pt states she had an A1C done in the past month at Dr. Josue Hector office. Office is closed now, will call later and get the result. Pt states her fasting blood sugar is usually between 89-170. Instructed pt not to take her Glipizide Sunday evening or Monday AM. Instructed pt to check her blood sugar when she gets up Monday AM and every 2 hours until she leaves for the hospital. If blood sugar is 70 or below, treat with 1/2 cup of clear juice (apple or cranberry) and recheck blood sugar 15 minutes after drinking juice. If blood sugar continues to be 70 or below, call the Short Stay department and ask to speak to a nurse. Pt voiced understanding.  Pt started Plavix today and was told to take it day of surgery along with her 81 mg Aspirin.  Covid test is scheduled for this afternoon. Pt instructed to be in quarantine after the test is done and to stay in quarantine until she comes to the hospital on Monday. She voiced understanding.  Chart sent to Anesthesia PA for review.

## 2020-04-18 NOTE — Progress Notes (Signed)
Anesthesia Chart Review:  Case: [id number] Date/Time: 04/21/20 0945   Procedure: TRANSCAROTID ARTERY REVASCULARIZATION (N/A )   Anesthesia type: General   Pre-op diagnosis: CAROTID ARTERY STENOSIS   Location: [facility] ROOM 16 / [facility]   Surgeons: Angelia Mould, MD      DISCUSSION: Patient is a 74 year old female scheduled for the above procedure. She as recurrent RICA stenosis.  History includes former smoker (quit 11/06/08), HLD, PAF (diagnosed 2014; ED visit 10/07/19 [facility]; uses PRN Cardizem for breakthrough episodes, last early 03/2020), carotid artery disease (s/p right carotid endarterectomy 2002, recurrent severe stenosis 02/2020), DM2, HTN, hypothyroidism, GERD, DVT (LLE DVT 2012). Normal coronaries in 2006 for chest pain evaluation.  COVID-19+ ~ 11/16//20. Had virtual visit with cardiologist Dr. Domenic Polite on 11/15/19. No changes made in PAF therapy. She is on b-blocker and PRN Cardizem for breakthrough episodes.  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 If enlarges, he would recommend consideration of biopsy or removal. She is to contact his office if she decides to proceed with FNA for definitive tissue diagnosis.   She reported instructions to hold Xarelto for 48 hours prior to surgery, last dose 04/18/20. She started Plavix on 04/18/20 and is to take with ASA 81 mg for procedure.  She is a same day work-up, so anesthesia team evaluation on the day of surgery. Depending on what records are received from her primary care, she may need EKG as well as preoperative labs on arrival.  04/18/20 COVID-19 test in process.   VS: As of 03/26/20, WT 99.8KG, BP 148/67, HR 59.    PROVIDERS: Celene Squibb, MD is PCP  Rozann Lesches, MD is cardiologist. Last evaluation 11/15/19 for PAF follow-up. CHA2DS2-VASc score of 5.  Continue Xarelto for stroke prophylaxis.  No change in present dose of  atenolol and use of as needed short acting Cardizem.   LABS: She is for labs on the day of surgery. Cr 0.80 on 03/31/20. A1c from ~ a month ago requested from PCP. Reported fasting CBGs ~ 89-170.   IMAGES: CTA head/neck 03/31/20: IMPRESSION: 1. Short segment hairline stenosis of the proximal cervical right ICA, approximately 1.5 mm in length. 2. A 50% stenosis of the left carotid bifurcation. 3. Luminal irregularity of the mid and upper cervical segment of the left ICA, question fibromuscular dysplasia. 4. 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 5. Extensive calcified plaques in the bilateral carotid siphons extending to the supraclinoid segment on the right where there appears to be mild stenosis. 6. A 2.7 cm enhancing left parotid nodule.  ENT consult suggested. 7. Degenerative changes of the cervical spine with grade 1 anterolisthesis of C4 over C5 and high-grade right-sided neural foraminal stenosis at C3-4 and bilaterally at C5-6. - Aortic Atherosclerosis (ICD10-I70.0).   EKG: Currently, last EKG available is > 44 year old, so may need to update on the day of surgery.    CV: Carotid US 03/26/20: Summary:  Right Carotid: Velocities in the right ICA are consistent with a 80-99%         stenosis, higher end of range..  Left Carotid: Velocities in the left ICA are consistent with a 40-59%  stenosis,        probably higher end of range. Calcific plaque may obscure  higher        velocity.  Vertebrals: Bilateral vertebral arteries demonstrate antegrade flow.  Subclavians:  Normal flow hemodynamics were seen in bilateral subclavian        arteries.    Echo 02/01/14: Summary: The left ventricle is normal in size. There is mild concentric LVH. The left ventricular ejection fraction is normal at 60 to 65%. The left ventricular wall motion is normal The right ventricular fraction is normal. Aortic valve is trileaflet. Mild 1+ aortic  regurgitation.Mild aortic sclerosis with good valvular opening. No thrombus. Grade 1 mild diastolic dysfunction; abnormal relaxation pattern. Pulmonary hypertension is not suggested by Doppler findings. Right ventricular systolic pressure is normal.   Cardiac cath 04/22/05: IMPRESSION/RECOMMENDATIONS:  Angiographically normal coronary arteries.  Normal left ventricular size and systolic function   Past Medical History:  Diagnosis Date  . Carotid artery occlusion    Right CEA in 2002  . Chest pain 2006   Stress nuclear-anteroapical and basilar inferior reversible defects; cath-normal coronary arteries and EF  . Chronic low back pain    MRI 2014: Spondylolisthesis of L4-L5 with foraminal narrowing, most prominent on the right and facet arthropathy.  L4-L5 surgery planned for 04/2013 with left-sided pedicle screw fixation.  . Degenerative joint disease   . Depression   . Diarrhea, functional   . DVT (deep venous thrombosis) ([place])   . Essential hypertension   . Gastroesophageal reflux    H/o stricture & hiatal hernia  . History of kidney stones   . Hyperlipidemia   . Hypothyroidism   . MRSA (methicillin resistant Staphylococcus aureus) June 2014  . Paroxysmal atrial fibrillation ([place]) 04/2013  . Type 2 diabetes mellitus ([place])     Past Surgical History:  Procedure Laterality Date  . BIOPSY  02/26/2019   Procedure: BIOPSY;  Surgeon: Danie Binder, MD;  Location: [facility];  Service: Endoscopy;;  random colon bx's  . CAROTID ENDARTERECTOMY Right 10/25/2001   Subsequent to episode of syncope  . COLONOSCOPY N/A 02/26/2019   Procedure: COLONOSCOPY;  Surgeon: Danie Binder, MD;  Location: [facility];  Service: Endoscopy;  Laterality: N/A;  8:30am  . LAMINECTOMY  2000   L5-S1 discectomy and laminectomy; 3 other surgical procedures subsequently performed  . LAPAROSCOPIC CHOLECYSTECTOMY    . SPINE SURGERY  05-23-13   X's 5 surgeries  . VAGINAL HYSTERECTOMY       MEDICATIONS: No current facility-administered medications for this encounter.   Marland Kitchen acetaminophen (TYLENOL) 650 MG CR tablet  . ALPRAZolam (XANAX) 0.5 MG tablet  . aspirin EC 81 MG tablet  . atenolol (TENORMIN) 50 MG tablet  . Carboxymethylcellul-Glycerin (LUBRICATING EYE DROPS OP)  . celecoxib (CELEBREX) 200 MG capsule  . chlorthalidone (HYGROTON) 25 MG tablet  . diclofenac Sodium (VOLTAREN) 1 % GEL  . dicyclomine (BENTYL) 10 MG capsule  . diltiazem (CARDIZEM) 30 MG tablet  . gabapentin (NEURONTIN) 300 MG capsule  . glipiZIDE (GLUCOTROL XL) 5 MG 24 hr tablet  . levothyroxine (SYNTHROID, LEVOTHROID) 50 MCG tablet  . losartan (COZAAR) 50 MG tablet  . Magnesium 250 MG TABS  . Multiple Vitamin (MULTIVITAMIN WITH MINERALS) TABS tablet  . omeprazole (PRILOSEC) 20 MG capsule  . potassium chloride SA (K-DUR,KLOR-CON) 20 MEQ tablet  . rivaroxaban (XARELTO) 20 MG TABS tablet  . rosuvastatin (CRESTOR) 10 MG tablet  . trolamine salicylate (ASPERCREME) 10 % cream  . clopidogrel (PLAVIX) 75 MG tablet    Myra Gianotti, PA-C Surgical Short Stay/Anesthesiology [facility] Phone [phone] [facility] Phone [phone] 04/18/2020 2:49 PM

## 2020-04-18 NOTE — Progress Notes (Signed)
Called Dr. Josue Hector office and got pt's most recent A1C that was done on 03/10/20. It was 7.1

## 2020-04-18 NOTE — Anesthesia Preprocedure Evaluation (Deleted)
Anesthesia Evaluation    Airway        Dental   Pulmonary former smoker,           Cardiovascular hypertension,      Neuro/Psych    GI/Hepatic   Endo/Other  diabetes  Renal/GU      Musculoskeletal   Abdominal   Peds  Hematology   Anesthesia Other Findings   Reproductive/Obstetrics                             Anesthesia Physical Anesthesia Plan  ASA:   Anesthesia Plan:    Post-op Pain Management:    Induction:   PONV Risk Score and Plan:   Airway Management Planned:   Additional Equipment:   Intra-op Plan:   Post-operative Plan:   Informed Consent:   Plan Discussed with:   Anesthesia Plan Comments: (PAT note written 04/18/2020 by Myra Gianotti, PA-C. SAME DAY WORK-UP   )        Anesthesia Quick Evaluation

## 2020-04-19 LAB — SARS CORONAVIRUS 2 (TAT 6-24 HRS): SARS Coronavirus 2: NEGATIVE

## 2020-04-19 NOTE — Progress Notes (Signed)
Called patient for time change. She advised she had been having diarrhea with Plavix. She denies stomach pain. Requested she contact on call provider for Dr. Scot Dock.

## 2020-04-20 NOTE — Anesthesia Preprocedure Evaluation (Addendum)
Anesthesia Evaluation  Patient identified by MRN, date of birth, ID band Patient awake    Reviewed: Allergy & Precautions, NPO status , Patient's Chart, lab work & pertinent test results, reviewed documented beta blocker date and time   History of Anesthesia Complications Negative for: history of anesthetic complications  Airway Mallampati: III  TM Distance: >3 FB Neck ROM: Full    Dental no notable dental hx. (+) Dental Advisory Given   Pulmonary neg pulmonary ROS, former smoker,    Pulmonary exam normal        Cardiovascular hypertension, Pt. on home beta blockers and Pt. on medications Normal cardiovascular exam+ dysrhythmias Atrial Fibrillation   Echo 02/01/14: Summary: The left ventricle is normal in size. There is mild concentric LVH. The left ventricular ejection fraction is normal at 60 to 65%. The left ventricular wall motion is normal The right ventricular fraction is normal. Aortic valve is trileaflet. Mild 1+ aortic regurgitation.Mild aortic sclerosis with good valvular opening. No thrombus. Grade 1 mild diastolic dysfunction; abnormal relaxation pattern. Pulmonary hypertension is not suggested by Doppler findings. Right ventricular systolic pressure is normal.   Cardiac cath 04/22/05: IMPRESSION/RECOMMENDATIONS: Angiographically normal coronary arteries. Normal left ventricular size and systolic function    Neuro/Psych PSYCHIATRIC DISORDERS Depression negative neurological ROS     GI/Hepatic Neg liver ROS, GERD  ,  Endo/Other  diabetesHypothyroidism   Renal/GU negative Renal ROS     Musculoskeletal negative musculoskeletal ROS (+)   Abdominal   Peds  Hematology negative hematology ROS (+)   Anesthesia Other Findings Day of surgery medications reviewed with the patient.  Reproductive/Obstetrics                            Anesthesia Physical Anesthesia Plan  ASA:  III  Anesthesia Plan: General   Post-op Pain Management:    Induction: Intravenous  PONV Risk Score and Plan: 4 or greater and Ondansetron, Dexamethasone, Diphenhydramine and Treatment may vary due to age or medical condition  Airway Management Planned: Oral ETT  Additional Equipment: Arterial line  Intra-op Plan:   Post-operative Plan: Extubation in OR  Informed Consent: I have reviewed the patients History and Physical, chart, labs and discussed the procedure including the risks, benefits and alternatives for the proposed anesthesia with the patient or authorized representative who has indicated his/her understanding and acceptance.     Dental advisory given  Plan Discussed with: Anesthesiologist, CRNA and Surgeon  Anesthesia Plan Comments:        Anesthesia Quick Evaluation

## 2020-04-21 ENCOUNTER — Encounter (HOSPITAL_COMMUNITY): Payer: Self-pay | Admitting: Vascular Surgery

## 2020-04-21 ENCOUNTER — Other Ambulatory Visit: Payer: Self-pay

## 2020-04-21 ENCOUNTER — Inpatient Hospital Stay (HOSPITAL_COMMUNITY): Payer: Medicare Other

## 2020-04-21 ENCOUNTER — Inpatient Hospital Stay (HOSPITAL_COMMUNITY)
Admission: RE | Admit: 2020-04-21 | Discharge: 2020-04-22 | DRG: 036 | Disposition: A | Discharge status: Home / Self Care | Payer: Medicare Other | Attending: Vascular Surgery | Admitting: Vascular Surgery

## 2020-04-21 ENCOUNTER — Encounter (HOSPITAL_COMMUNITY)
Admission: RE | Disposition: A | Discharge status: Home / Self Care | Payer: Self-pay | Source: Home / Self Care | Attending: Vascular Surgery

## 2020-04-21 ENCOUNTER — Inpatient Hospital Stay (HOSPITAL_COMMUNITY): Payer: Medicare Other | Admitting: Vascular Surgery

## 2020-04-21 DIAGNOSIS — I10 Essential (primary) hypertension: Secondary | ICD-10-CM | POA: Diagnosis not present

## 2020-04-21 DIAGNOSIS — Z833 Family history of diabetes mellitus: Secondary | ICD-10-CM

## 2020-04-21 DIAGNOSIS — E119 Type 2 diabetes mellitus without complications: Secondary | ICD-10-CM | POA: Diagnosis present

## 2020-04-21 DIAGNOSIS — E785 Hyperlipidemia, unspecified: Secondary | ICD-10-CM | POA: Diagnosis not present

## 2020-04-21 DIAGNOSIS — Z8616 Personal history of COVID-19: Secondary | ICD-10-CM

## 2020-04-21 DIAGNOSIS — Z7901 Long term (current) use of anticoagulants: Secondary | ICD-10-CM

## 2020-04-21 DIAGNOSIS — Z79899 Other long term (current) drug therapy: Secondary | ICD-10-CM

## 2020-04-21 DIAGNOSIS — Z79890 Hormone replacement therapy (postmenopausal): Secondary | ICD-10-CM

## 2020-04-21 DIAGNOSIS — Z20822 Contact with and (suspected) exposure to covid-19: Secondary | ICD-10-CM | POA: Diagnosis not present

## 2020-04-21 DIAGNOSIS — Z823 Family history of stroke: Secondary | ICD-10-CM

## 2020-04-21 DIAGNOSIS — I351 Nonrheumatic aortic (valve) insufficiency: Secondary | ICD-10-CM | POA: Diagnosis present

## 2020-04-21 DIAGNOSIS — K219 Gastro-esophageal reflux disease without esophagitis: Secondary | ICD-10-CM | POA: Diagnosis present

## 2020-04-21 DIAGNOSIS — E039 Hypothyroidism, unspecified: Secondary | ICD-10-CM | POA: Diagnosis present

## 2020-04-21 DIAGNOSIS — I6529 Occlusion and stenosis of unspecified carotid artery: Secondary | ICD-10-CM | POA: Diagnosis present

## 2020-04-21 DIAGNOSIS — Z87442 Personal history of urinary calculi: Secondary | ICD-10-CM

## 2020-04-21 DIAGNOSIS — Z7984 Long term (current) use of oral hypoglycemic drugs: Secondary | ICD-10-CM | POA: Diagnosis not present

## 2020-04-21 DIAGNOSIS — I480 Paroxysmal atrial fibrillation: Secondary | ICD-10-CM | POA: Diagnosis present

## 2020-04-21 DIAGNOSIS — I6523 Occlusion and stenosis of bilateral carotid arteries: Secondary | ICD-10-CM | POA: Diagnosis not present

## 2020-04-21 DIAGNOSIS — Z86718 Personal history of other venous thrombosis and embolism: Secondary | ICD-10-CM | POA: Diagnosis not present

## 2020-04-21 DIAGNOSIS — I6521 Occlusion and stenosis of right carotid artery: Secondary | ICD-10-CM | POA: Diagnosis not present

## 2020-04-21 DIAGNOSIS — Z87891 Personal history of nicotine dependence: Secondary | ICD-10-CM | POA: Diagnosis not present

## 2020-04-21 DIAGNOSIS — Z8249 Family history of ischemic heart disease and other diseases of the circulatory system: Secondary | ICD-10-CM | POA: Diagnosis not present

## 2020-04-21 HISTORY — PX: TRANSCAROTID ARTERY REVASCULARIZATIONÂ: SHX6778

## 2020-04-21 HISTORY — DX: Personal history of urinary calculi: Z87.442

## 2020-04-21 HISTORY — DX: Functional diarrhea: K59.1

## 2020-04-21 HISTORY — DX: Hypothyroidism, unspecified: E03.9

## 2020-04-21 HISTORY — DX: Depression, unspecified: F32.A

## 2020-04-21 LAB — CBC
HCT: 42 % (ref 36.0–46.0)
Hemoglobin: 12.8 g/dL (ref 12.0–15.0)
MCH: 28.3 pg (ref 26.0–34.0)
MCHC: 30.5 g/dL (ref 30.0–36.0)
MCV: 92.9 fL (ref 80.0–100.0)
Platelets: 204 10*3/uL (ref 150–400)
RBC: 4.52 MIL/uL (ref 3.87–5.11)
RDW: 15 % (ref 11.5–15.5)
WBC: 8.9 10*3/uL (ref 4.0–10.5)
nRBC: 0 % (ref 0.0–0.2)

## 2020-04-21 LAB — TYPE AND SCREEN
ABO/RH(D): A NEG
Antibody Screen: NEGATIVE

## 2020-04-21 LAB — PROTIME-INR
INR: 1 (ref 0.8–1.2)
Prothrombin Time: 13 seconds (ref 11.4–15.2)

## 2020-04-21 LAB — COMPREHENSIVE METABOLIC PANEL
ALT: 22 U/L (ref 0–44)
AST: 22 U/L (ref 15–41)
Albumin: 3.2 g/dL — ABNORMAL LOW (ref 3.5–5.0)
Alkaline Phosphatase: 66 U/L (ref 38–126)
Anion gap: 10 (ref 5–15)
BUN: 15 mg/dL (ref 8–23)
CO2: 28 mmol/L (ref 22–32)
Calcium: 9.1 mg/dL (ref 8.9–10.3)
Chloride: 105 mmol/L (ref 98–111)
Creatinine, Ser: 0.9 mg/dL (ref 0.44–1.00)
GFR calc Af Amer: >60 mL/min (ref >60)
GFR calc non Af Amer: >60 mL/min (ref >60)
Glucose, Bld: 153 mg/dL — ABNORMAL HIGH (ref 70–99)
Potassium: 4 mmol/L (ref 3.5–5.1)
Sodium: 143 mmol/L (ref 135–145)
Total Bilirubin: 0.7 mg/dL (ref 0.3–1.2)
Total Protein: 5.8 g/dL — ABNORMAL LOW (ref 6.5–8.1)

## 2020-04-21 LAB — GLUCOSE, CAPILLARY
Glucose-Capillary: 147 mg/dL — ABNORMAL HIGH (ref 70–99)
Glucose-Capillary: 142 mg/dL — ABNORMAL HIGH (ref 70–99)
Glucose-Capillary: 181 mg/dL — ABNORMAL HIGH (ref 70–99)

## 2020-04-21 LAB — APTT: aPTT: 27 seconds (ref 24–36)

## 2020-04-21 LAB — ABO/RH: ABO/RH(D): A NEG

## 2020-04-21 LAB — POCT ACTIVATED CLOTTING TIME
Activated Clotting Time: 246 seconds
Activated Clotting Time: 268 seconds

## 2020-04-21 LAB — SURGICAL PCR SCREEN
MRSA, PCR: NEGATIVE
Staphylococcus aureus: NEGATIVE

## 2020-04-21 SURGERY — TRANSCAROTID ARTERY REVASCULARIZATION (TCAR)
Anesthesia: General | Laterality: Right

## 2020-04-21 MED ORDER — DILTIAZEM HCL 30 MG PO TABS
30.0000 mg | ORAL_TABLET | Freq: Two times a day (BID) | ORAL | Status: DC | PRN
  Filled 2020-04-21: qty 1

## 2020-04-21 MED ORDER — 0.9 % SODIUM CHLORIDE (POUR BTL) OPTIME
TOPICAL | Status: DC | PRN
  Administered 2020-04-21: 1000 mL

## 2020-04-21 MED ORDER — GABAPENTIN 300 MG PO CAPS
300.0000 mg | ORAL_CAPSULE | Freq: Three times a day (TID) | ORAL | Status: DC
  Administered 2020-04-21 – 2020-04-22 (×2): 300 mg via ORAL
  Filled 2020-04-21 (×2): qty 1

## 2020-04-21 MED ORDER — GLYCOPYRROLATE PF 0.2 MG/ML IJ SOSY
PREFILLED_SYRINGE | INTRAMUSCULAR | Status: DC | PRN
  Administered 2020-04-21: .2 mg via INTRAVENOUS

## 2020-04-21 MED ORDER — GLIPIZIDE ER 5 MG PO TB24
5.0000 mg | ORAL_TABLET | Freq: Two times a day (BID) | ORAL | Status: DC
  Administered 2020-04-22: 5 mg via ORAL
  Filled 2020-04-21: qty 1

## 2020-04-21 MED ORDER — ACETAMINOPHEN 325 MG PO TABS: 325.0000 mg | ORAL_TABLET | ORAL | Status: DC | PRN

## 2020-04-21 MED ORDER — DICYCLOMINE HCL 10 MG PO CAPS
10.0000 mg | ORAL_CAPSULE | Freq: Four times a day (QID) | ORAL | Status: DC | PRN
  Filled 2020-04-21: qty 1

## 2020-04-21 MED ORDER — SODIUM CHLORIDE 0.9 % IV SOLN
0.0125 ug/kg/min | INTRAVENOUS | Status: AC
  Administered 2020-04-21: 08:00:00 .05 ug/kg/min via INTRAVENOUS
  Filled 2020-04-21: qty 2000

## 2020-04-21 MED ORDER — PHENYLEPHRINE HCL-NACL 10-0.9 MG/250ML-% IV SOLN
INTRAVENOUS | Status: DC | PRN
  Administered 2020-04-21: 50 ug/min via INTRAVENOUS

## 2020-04-21 MED ORDER — CELECOXIB 200 MG PO CAPS
200.0000 mg | ORAL_CAPSULE | Freq: Once | ORAL | Status: AC
  Administered 2020-04-21: 200 mg via ORAL
  Filled 2020-04-21: qty 1

## 2020-04-21 MED ORDER — ASPIRIN EC 81 MG PO TBEC
81.0000 mg | DELAYED_RELEASE_TABLET | Freq: Every day | ORAL | Status: DC
  Administered 2020-04-22: 81 mg via ORAL
  Filled 2020-04-21: qty 1

## 2020-04-21 MED ORDER — IODIXANOL 320 MG/ML IV SOLN
INTRAVENOUS | Status: DC | PRN
  Administered 2020-04-21: 25 mL via INTRA_ARTERIAL

## 2020-04-21 MED ORDER — PHENOL 1.4 % MT LIQD: 1.0000 | OROMUCOSAL | Status: DC | PRN

## 2020-04-21 MED ORDER — FENTANYL CITRATE (PF) 250 MCG/5ML IJ SOLN
INTRAMUSCULAR | Status: AC
  Filled 2020-04-21: qty 5

## 2020-04-21 MED ORDER — CELECOXIB 200 MG PO CAPS
200.0000 mg | ORAL_CAPSULE | Freq: Every day | ORAL | Status: DC
  Filled 2020-04-21: qty 1

## 2020-04-21 MED ORDER — CHLORTHALIDONE 25 MG PO TABS
25.0000 mg | ORAL_TABLET | Freq: Every day | ORAL | Status: DC
  Administered 2020-04-22: 25 mg via ORAL
  Filled 2020-04-21: qty 1

## 2020-04-21 MED ORDER — SODIUM CHLORIDE 0.9 % IV SOLN: INTRAVENOUS | Status: DC

## 2020-04-21 MED ORDER — FENTANYL CITRATE (PF) 100 MCG/2ML IJ SOLN
25.0000 ug | INTRAMUSCULAR | Status: DC | PRN
  Administered 2020-04-21 (×2): 25 ug via INTRAVENOUS

## 2020-04-21 MED ORDER — HEMOSTATIC AGENTS (NO CHARGE) OPTIME
TOPICAL | Status: DC | PRN
  Administered 2020-04-21: 1 via TOPICAL

## 2020-04-21 MED ORDER — LACTATED RINGERS IV SOLN: INTRAVENOUS | Status: DC | PRN

## 2020-04-21 MED ORDER — FENTANYL CITRATE (PF) 100 MCG/2ML IJ SOLN
INTRAMUSCULAR | Status: DC | PRN
  Administered 2020-04-21 (×2): 50 ug via INTRAVENOUS

## 2020-04-21 MED ORDER — LOSARTAN POTASSIUM 50 MG PO TABS
50.0000 mg | ORAL_TABLET | Freq: Every day | ORAL | Status: DC
  Administered 2020-04-22: 50 mg via ORAL
  Filled 2020-04-21: qty 1

## 2020-04-21 MED ORDER — PROMETHAZINE HCL 25 MG/ML IJ SOLN: 6.2500 mg | INTRAMUSCULAR | Status: DC | PRN

## 2020-04-21 MED ORDER — HEPARIN SODIUM (PORCINE) 1000 UNIT/ML IJ SOLN
INTRAMUSCULAR | Status: DC | PRN
Start: 2020-04-21 — End: 2020-04-21
  Administered 2020-04-21: 10000 [IU] via INTRAVENOUS
  Administered 2020-04-21: 3000 [IU] via INTRAVENOUS

## 2020-04-21 MED ORDER — CLOPIDOGREL BISULFATE 75 MG PO TABS
75.0000 mg | ORAL_TABLET | Freq: Every day | ORAL | Status: DC
  Administered 2020-04-22: 75 mg via ORAL
  Filled 2020-04-21: qty 1

## 2020-04-21 MED ORDER — ONDANSETRON HCL 4 MG/2ML IJ SOLN: 4.0000 mg | Freq: Four times a day (QID) | INTRAMUSCULAR | Status: DC | PRN

## 2020-04-21 MED ORDER — ROCURONIUM BROMIDE 50 MG/5ML IV SOSY
PREFILLED_SYRINGE | INTRAVENOUS | Status: DC | PRN
  Administered 2020-04-21: 20 mg via INTRAVENOUS
  Administered 2020-04-21: 80 mg via INTRAVENOUS

## 2020-04-21 MED ORDER — LABETALOL HCL 5 MG/ML IV SOLN: 10.0000 mg | INTRAVENOUS | Status: DC | PRN

## 2020-04-21 MED ORDER — METOPROLOL TARTRATE 5 MG/5ML IV SOLN: 2.0000 mg | INTRAVENOUS | Status: DC | PRN

## 2020-04-21 MED ORDER — MUPIROCIN 2 % EX OINT
1.0000 "application " | TOPICAL_OINTMENT | Freq: Once | CUTANEOUS | Status: AC
  Administered 2020-04-21: 1 via TOPICAL
  Filled 2020-04-21: qty 22

## 2020-04-21 MED ORDER — ALPRAZOLAM 0.5 MG PO TABS
0.5000 mg | ORAL_TABLET | Freq: Every day | ORAL | Status: DC
  Administered 2020-04-21: 0.5 mg via ORAL
  Filled 2020-04-21: qty 1

## 2020-04-21 MED ORDER — MORPHINE SULFATE (PF) 2 MG/ML IV SOLN: 2.0000 mg | INTRAVENOUS | Status: DC | PRN

## 2020-04-21 MED ORDER — HYDRALAZINE HCL 20 MG/ML IJ SOLN: 5.0000 mg | INTRAMUSCULAR | Status: DC | PRN

## 2020-04-21 MED ORDER — LEVOTHYROXINE SODIUM 50 MCG PO TABS
50.0000 ug | ORAL_TABLET | Freq: Every day | ORAL | Status: DC
  Administered 2020-04-22: 50 ug via ORAL
  Filled 2020-04-21: qty 1

## 2020-04-21 MED ORDER — CEFAZOLIN SODIUM-DEXTROSE 2-4 GM/100ML-% IV SOLN
2.0000 g | Freq: Three times a day (TID) | INTRAVENOUS | Status: AC
  Administered 2020-04-21 – 2020-04-22 (×2): 2 g via INTRAVENOUS
  Filled 2020-04-21 (×2): qty 100

## 2020-04-21 MED ORDER — ACETAMINOPHEN 500 MG PO TABS
1000.0000 mg | ORAL_TABLET | Freq: Once | ORAL | Status: AC
  Administered 2020-04-21: 1000 mg via ORAL
  Filled 2020-04-21: qty 2

## 2020-04-21 MED ORDER — GUAIFENESIN-DM 100-10 MG/5ML PO SYRP: 15.0000 mL | ORAL_SOLUTION | ORAL | Status: DC | PRN

## 2020-04-21 MED ORDER — SODIUM CHLORIDE 0.9 % IV SOLN
INTRAVENOUS | Status: AC
  Filled 2020-04-21: qty 1.2

## 2020-04-21 MED ORDER — CHLORHEXIDINE GLUCONATE CLOTH 2 % EX PADS: 6.0000 | MEDICATED_PAD | Freq: Once | CUTANEOUS | Status: DC

## 2020-04-21 MED ORDER — ALUM & MAG HYDROXIDE-SIMETH 200-200-20 MG/5ML PO SUSP: 15.0000 mL | ORAL | Status: DC | PRN

## 2020-04-21 MED ORDER — MAGNESIUM SULFATE 2 GM/50ML IV SOLN: 2.0000 g | Freq: Every day | INTRAVENOUS | Status: DC | PRN

## 2020-04-21 MED ORDER — ROSUVASTATIN CALCIUM 5 MG PO TABS
10.0000 mg | ORAL_TABLET | Freq: Every day | ORAL | Status: DC
  Administered 2020-04-21: 10 mg via ORAL
  Filled 2020-04-21: qty 2

## 2020-04-21 MED ORDER — PROTAMINE SULFATE 10 MG/ML IV SOLN
INTRAVENOUS | Status: DC | PRN
Start: 2020-04-21 — End: 2020-04-21
  Administered 2020-04-21: 50 mg via INTRAVENOUS

## 2020-04-21 MED ORDER — SUGAMMADEX SODIUM 500 MG/5ML IV SOLN
INTRAVENOUS | Status: DC | PRN
  Administered 2020-04-21: 400 mg via INTRAVENOUS

## 2020-04-21 MED ORDER — ONDANSETRON HCL 4 MG/2ML IJ SOLN
INTRAMUSCULAR | Status: DC | PRN
  Administered 2020-04-21: 4 mg via INTRAVENOUS

## 2020-04-21 MED ORDER — ONDANSETRON HCL 4 MG/2ML IJ SOLN: INTRAMUSCULAR | Status: DC | PRN

## 2020-04-21 MED ORDER — DEXAMETHASONE SODIUM PHOSPHATE 10 MG/ML IJ SOLN
INTRAMUSCULAR | Status: DC | PRN
  Administered 2020-04-21: 5 mg via INTRAVENOUS

## 2020-04-21 MED ORDER — PANTOPRAZOLE SODIUM 40 MG PO TBEC
40.0000 mg | DELAYED_RELEASE_TABLET | Freq: Every day | ORAL | Status: DC
  Administered 2020-04-22: 40 mg via ORAL
  Filled 2020-04-21: qty 1

## 2020-04-21 MED ORDER — POTASSIUM CHLORIDE CRYS ER 20 MEQ PO TBCR: 20.0000 meq | EXTENDED_RELEASE_TABLET | Freq: Every day | ORAL | Status: DC | PRN

## 2020-04-21 MED ORDER — PROPOFOL 10 MG/ML IV BOLUS
INTRAVENOUS | Status: DC | PRN
  Administered 2020-04-21: 150 mg via INTRAVENOUS

## 2020-04-21 MED ORDER — OXYCODONE-ACETAMINOPHEN 5-325 MG PO TABS
ORAL_TABLET | ORAL | Status: AC
  Filled 2020-04-21: qty 2

## 2020-04-21 MED ORDER — CEFAZOLIN SODIUM-DEXTROSE 2-4 GM/100ML-% IV SOLN
2.0000 g | INTRAVENOUS | Status: AC
  Administered 2020-04-21: 2 g via INTRAVENOUS
  Filled 2020-04-21: qty 100

## 2020-04-21 MED ORDER — SODIUM CHLORIDE 0.9 % IV SOLN
INTRAVENOUS | Status: DC | PRN
  Administered 2020-04-21: 500 mL

## 2020-04-21 MED ORDER — ACETAMINOPHEN 325 MG RE SUPP: 325.0000 mg | RECTAL | Status: DC | PRN

## 2020-04-21 MED ORDER — DOCUSATE SODIUM 100 MG PO CAPS: 100.0000 mg | ORAL_CAPSULE | Freq: Every day | ORAL | Status: DC

## 2020-04-21 MED ORDER — ATENOLOL 50 MG PO TABS
50.0000 mg | ORAL_TABLET | Freq: Two times a day (BID) | ORAL | Status: DC
  Administered 2020-04-21 – 2020-04-22 (×2): 50 mg via ORAL
  Filled 2020-04-21: qty 1
  Filled 2020-04-21: qty 2
  Filled 2020-04-21: qty 1
  Filled 2020-04-21: qty 2

## 2020-04-21 MED ORDER — LIDOCAINE 2% (20 MG/ML) 5 ML SYRINGE
INTRAMUSCULAR | Status: DC | PRN
  Administered 2020-04-21: 60 mg via INTRAVENOUS

## 2020-04-21 MED ORDER — FENTANYL CITRATE (PF) 100 MCG/2ML IJ SOLN
INTRAMUSCULAR | Status: AC
  Filled 2020-04-21: qty 2

## 2020-04-21 MED ORDER — OXYCODONE-ACETAMINOPHEN 5-325 MG PO TABS
1.0000 | ORAL_TABLET | ORAL | Status: DC | PRN
  Administered 2020-04-21: 2 via ORAL

## 2020-04-21 MED ORDER — SODIUM CHLORIDE 0.9 % IV SOLN: 500.0000 mL | Freq: Once | INTRAVENOUS | Status: DC | PRN

## 2020-04-21 SURGICAL SUPPLY — 64 items
ADH SKN CLS APL DERMABOND .7 (GAUZE/BANDAGES/DRESSINGS) ×2
ADPR TBG 2 MALE LL ART (MISCELLANEOUS)
BAG BANDED W/RUBBER/TAPE 36X54 (MISCELLANEOUS) ×3 IMPLANT
BAG EQP BAND 135X91 W/RBR TAPE (MISCELLANEOUS) ×1
BALLN STERLING RX 5X30X80 (BALLOONS) ×3
BALLOON STERLING RX 5X30X80 (BALLOONS) ×1 IMPLANT
CANISTER SUCT 3000ML PPV (MISCELLANEOUS) ×3 IMPLANT
CANNULA VESSEL 3MM 2 BLNT TIP (CANNULA) ×2 IMPLANT
CATH ROBINSON RED A/P 18FR (CATHETERS) IMPLANT
CATH SUCT 10FR WHISTLE TIP (CATHETERS) ×1 IMPLANT
CLIP VESOCCLUDE MED 6/CT (CLIP) ×3 IMPLANT
CLIP VESOCCLUDE SM WIDE 6/CT (CLIP) ×3 IMPLANT
COVER DOME SNAP 22 D (MISCELLANEOUS) ×3 IMPLANT
COVER PROBE W GEL 5X96 (DRAPES) ×3 IMPLANT
COVER WAND RF STERILE (DRAPES) ×3 IMPLANT
DERMABOND ADVANCED (GAUZE/BANDAGES/DRESSINGS) ×4
DERMABOND ADVANCED .7 DNX12 (GAUZE/BANDAGES/DRESSINGS) ×1 IMPLANT
DRAPE FEMORAL ANGIO 80X135IN (DRAPES) ×3 IMPLANT
DRAPE INCISE IOBAN 66X45 STRL (DRAPES) IMPLANT
ELECT REM PT RETURN 9FT ADLT (ELECTROSURGICAL) ×3
ELECTRODE REM PT RTRN 9FT ADLT (ELECTROSURGICAL) ×1 IMPLANT
GLOVE BIO SURGEON STRL SZ7.5 (GLOVE) ×7 IMPLANT
GLOVE BIOGEL PI IND STRL 8 (GLOVE) ×1 IMPLANT
GLOVE BIOGEL PI INDICATOR 8 (GLOVE) ×2
GOWN STRL REUS W/ TWL LRG LVL3 (GOWN DISPOSABLE) ×2 IMPLANT
GOWN STRL REUS W/ TWL XL LVL3 (GOWN DISPOSABLE) ×2 IMPLANT
GOWN STRL REUS W/TWL LRG LVL3 (GOWN DISPOSABLE) ×6
GOWN STRL REUS W/TWL XL LVL3 (GOWN DISPOSABLE) ×6
GUIDEWIRE ENROUTE 0.014 (WIRE) ×3 IMPLANT
HEMOSTAT SNOW SURGICEL 2X4 (HEMOSTASIS) ×2 IMPLANT
INTRODUCER KIT GALT 7CM (INTRODUCER) ×3
IV ADAPTER SYR DOUBLE MALE LL (MISCELLANEOUS) IMPLANT
KIT BASIN OR (CUSTOM PROCEDURE TRAY) ×3 IMPLANT
KIT ENCORE 26 ADVANTAGE (KITS) ×3 IMPLANT
KIT INTRODUCER GALT 7 (INTRODUCER) ×1 IMPLANT
KIT TURNOVER KIT B (KITS) ×3 IMPLANT
NDL HYPO 25GX1X1/2 BEV (NEEDLE) IMPLANT
NDL PERC 18GX7CM (NEEDLE) ×1 IMPLANT
NEEDLE HYPO 25GX1X1/2 BEV (NEEDLE) IMPLANT
NEEDLE PERC 18GX7CM (NEEDLE) IMPLANT
PACK CAROTID (CUSTOM PROCEDURE TRAY) ×3 IMPLANT
PAD ARMBOARD 7.5X6 YLW CONV (MISCELLANEOUS) ×6 IMPLANT
POSITIONER HEAD DONUT 9IN (MISCELLANEOUS) ×3 IMPLANT
PROTECTION STATION PRESSURIZED (MISCELLANEOUS) ×3
SET MICROPUNCTURE 5F STIFF (MISCELLANEOUS) ×2 IMPLANT
STATION PROTECTION PRESSURIZED (MISCELLANEOUS) ×1 IMPLANT
STENT TRANSCAROTID SYS 10X40 (Permanent Stent) ×2 IMPLANT
STOPCOCK 4 WAY LG BORE MALE ST (IV SETS) IMPLANT
SUT MNCRL AB 4-0 PS2 18 (SUTURE) ×2 IMPLANT
SUT PROLENE 5 0 C 1 24 (SUTURE) ×2 IMPLANT
SUT PROLENE 6 0 BV (SUTURE) ×3 IMPLANT
SUT SILK 2 0 PERMA HAND 18 BK (SUTURE) ×3 IMPLANT
SUT SILK 2 0 SH (SUTURE) ×3 IMPLANT
SUT VIC AB 3-0 SH 27 (SUTURE) ×6
SUT VIC AB 3-0 SH 27X BRD (SUTURE) ×2 IMPLANT
SYR 10ML LL (SYRINGE) ×9 IMPLANT
SYR 20ML LL LF (SYRINGE) ×3 IMPLANT
SYR CONTROL 10ML LL (SYRINGE) IMPLANT
SYSTEM TRANSCAROTID NEUROPRTCT (MISCELLANEOUS) ×1 IMPLANT
TOWEL GREEN STERILE (TOWEL DISPOSABLE) ×3 IMPLANT
TRANSCAROTID NEUROPROTECT SYS (MISCELLANEOUS) ×3
WATER STERILE IRR 1000ML POUR (IV SOLUTION) ×3 IMPLANT
WIRE BENTSON .035X145CM (WIRE) ×3 IMPLANT
WIRE TORQFLEX AUST .018X40CM (WIRE) ×2 IMPLANT

## 2020-04-21 NOTE — Anesthesia Procedure Notes (Signed)
Arterial Line Insertion Start/End4/26/2021 7:15 AM, 04/21/2020 7:15 AM Performed by: Duane Boston, MD, Renato Shin, CRNA, CRNA  Patient location: Pre-op. Preanesthetic checklist: patient identified, IV checked, site marked, risks and benefits discussed, surgical consent, monitors and equipment checked, pre-op evaluation, timeout performed and anesthesia consent radial was placed Catheter size: 20 G Hand hygiene performed , maximum sterile barriers used  and Seldinger technique used Allen's test indicative of satisfactory collateral circulation Attempts: 3 Procedure performed without using ultrasound guided technique. Ultrasound Notes:anatomy identified, needle tip was noted to be adjacent to the nerve/plexus identified and no ultrasound evidence of intravascular and/or intraneural injection Following insertion, Biopatch and dressing applied. Post procedure assessment: normal  Patient tolerated the procedure well with no immediate complications.

## 2020-04-21 NOTE — Op Note (Signed)
    Patient name: Martha Torres MRN: [id number] DOB: 11-16-1946 Sex: female  04/21/2020 Pre-operative Diagnosis: Asymptomatic right carotid artery stenosis Post-operative diagnosis:  Same Surgeon:  Erlene Quan C. Donzetta Matters, MD Assistant:  Gae Gallop, MD Procedure Performed:.  Right sided transcarotid artery stenting with 10 x 40 in route stent using flow reversal neuro protection   Indications: 74 year old female with a asymptomatic recurrent right internal carotid artery stenosis.  She has previously undergone right carotid endarterectomy and this has been followed and is now reached greater than 80%.  By CT scan she appears to be candidate for transcarotid artery stenting.  She has had dual antiplatelet therapy is also anticoagulated.  Findings: There is a high bifurcation of the right innominate artery.  Right common carotid artery was healthy.  There was approximate 90% stenosis of the proximal ICA just distal to the bifurcation.  After stenting there is 0% residual stenosis.   Procedure:  The patient was identified in the holding area and taken to the operating room where she was placed supine operative when general anesthesia was induced.  She was sterilely prepped and draped in the neck and chest as well as bilateral groins in usual fashion, antibiotics were administered and a timeout was called.  We began using ultrasound to identify both the common carotid artery on the right side as well as the left common femoral vein.  Left common femoral vein was cannulated with micropuncture needle followed by wire and sheath.  A Bentson wire was placed followed by the 8 [place] sheath with flow reversal mechanism.  We then made a longitudinal incision along between the 2 heads of the sternocleidomastoid.  We dissected down bluntly through this area.  Identified the common carotid artery just after the takeoff.  We placed a vessel loop as well as an umbilical tape around this and the patient was fully  heparinized.  Initially ACT returned 246 after additional heparin was greater than 260.  We then placed a 5-0 Prolene stitch cannulated the common carotid artery and placed the wire followed by micropuncture sheath at 3 cm.  We performed angiography.  We placed an Amplatz wire sure the lesion and then placed the TCAR sheath.  We then attached flow reversal and confirmed flow reversal passively.  TCAR timeout was performed.  We we affixed the TCAR sheath to the skin.  We performed additional carotid angiography..  We crossed the lesion with the 014 wire and then ballooned the lesion.  The 10 x 40 stent was placed.  After 2 minutes we performed additional angiography and 2 views demonstrated the stent was patent and the lesion was excluded.  The wire was removed.  The TCAR sheath was removed and the suture was cinched.  The sheath was removed from the groin and 50 mg of protamine was administered.  Pressure was held the groin.  We obtain hemostasis in the neck wound and closed in layers of Vicryl and Monocryl.  Dermabond is placed to the level of skin.  She was awakened from anesthesia having tolerated procedure without immediate complication was noted to be neurologically intact was transferred to the recovery room.    EBL: 25cc  Fitzhugh Vizcarrondo C. Donzetta Matters, MD [facility] Office: [phone] Pager: [phone]

## 2020-04-21 NOTE — Progress Notes (Signed)
Pt received from PACU in wheelchair. Pt C/A/Ox4. Pt has Aline. CHG bath given. Pt vitals stabled. A=line zeroed. Pt denies complaints. Pt oriented to room and call bell within reach. Will continue to monitor. Jerald Kief, RN

## 2020-04-21 NOTE — Progress Notes (Addendum)
   VASCULAR SURGERY POSTOP:   Doing well postop.  Anticipate D/C in AM  Resume Xarelto. Plan Plavix for 1 month also, and ASA.    SUBJECTIVE:   No complaints  PHYSICAL EXAM:   Vitals:   04/21/20 0925 04/21/20 0940 04/21/20 0955 04/21/20 1010  BP: 126/64 132/70 138/61 132/69  Pulse: 63 60 (!) 59 (!) 58  Resp: 18 17 14 17   Temp:      TempSrc:      SpO2: 98% 96% 93% 92%  Weight:      Height:       NEURO: intact Incision looks fine.   LABS:    CBG (last 3)  Recent Labs    04/21/20 0918  GLUCAP 142*    PROBLEM LIST:    Active Problems:   Carotid artery stenosis   CURRENT MEDS:   . fentaNYL        Deitra Mayo Office: [phone] 04/21/2020

## 2020-04-21 NOTE — Anesthesia Postprocedure Evaluation (Signed)
Anesthesia Post Note  Patient: Martha Torres  Procedure(s) Performed: TRANSCAROTID ARTERY REVASCULARIZATION RIGHT (Right )     Patient location during evaluation: PACU Anesthesia Type: General Level of consciousness: sedated Pain management: pain level controlled Vital Signs Assessment: post-procedure vital signs reviewed and stable Respiratory status: spontaneous breathing and respiratory function stable Cardiovascular status: stable Postop Assessment: no apparent nausea or vomiting Anesthetic complications: no    Last Vitals:  Vitals:   04/21/20 0955 04/21/20 1010  BP: 138/61 132/69  Pulse: (!) 59 (!) 58  Resp: 14 17  Temp:    SpO2: 93% 92%    Last Pain:  Vitals:   04/21/20 1010  TempSrc:   PainSc: 5                  [name] DANIEL

## 2020-04-21 NOTE — Anesthesia Procedure Notes (Signed)
Procedure Name: Intubation Date/Time: 04/21/2020 7:42 AM Performed by: Neldon Newport, CRNA Pre-anesthesia Checklist: Patient identified, Emergency Drugs available, Suction available and Patient being monitored Patient Re-evaluated:Patient Re-evaluated prior to induction Oxygen Delivery Method: Circle system utilized Preoxygenation: Pre-oxygenation with 100% oxygen Induction Type: IV induction Ventilation: Mask ventilation without difficulty Laryngoscope Size: Miller and 2 Grade View: Grade I Tube type: Oral Tube size: 7.0 mm Number of attempts: 1 Airway Equipment and Method: Stylet and Oral airway Placement Confirmation: ETT inserted through vocal cords under direct vision,  positive ETCO2 and breath sounds checked- equal and bilateral Secured at: 20 cm Tube secured with: Tape Dental Injury: Teeth and Oropharynx as per pre-operative assessment  Comments: Placed by Thomes Lolling

## 2020-04-21 NOTE — Interval H&P Note (Signed)
History and Physical Interval Note:  04/21/2020 7:17 AM  Martha Torres  has presented today for surgery, with the diagnosis of CAROTID ARTERY STENOSIS.  The various methods of treatment have been discussed with the patient and family. After consideration of risks, benefits and other options for treatment, the patient has consented to  Procedure(s): TRANSCAROTID ARTERY REVASCULARIZATION (N/A) as a surgical intervention.  The patient's history has been reviewed, patient examined, no change in status, stable for surgery.  I have reviewed the patient's chart and labs.  Questions were answered to the patient's satisfaction.     Deitra Mayo

## 2020-04-21 NOTE — Interval H&P Note (Signed)
History and Physical Interval Note:  04/21/2020 7:16 AM  Martha Torres  has presented today for surgery, with the diagnosis of CAROTID ARTERY STENOSIS.  The various methods of treatment have been discussed with the patient and family. After consideration of risks, benefits and other options for treatment, the patient has consented to  Procedure(s): TRANSCAROTID ARTERY REVASCULARIZATION (N/A) as a surgical intervention.  The patient's history has been reviewed, patient examined, no change in status, stable for surgery.  I have reviewed the patient's chart and labs.  Questions were answered to the patient's satisfaction.     Deitra Mayo

## 2020-04-21 NOTE — Transfer of Care (Signed)
Immediate Anesthesia Transfer of Care Note  Patient: Martha Torres  Procedure(s) Performed: Dorthula Perfect ARTERY REVASCULARIZATION RIGHT (Right )  Patient Location: PACU  Anesthesia Type:General  Level of Consciousness: awake, alert  and oriented  Airway & Oxygen Therapy: Patient Spontanous Breathing and Patient connected to face mask oxygen  Post-op Assessment: Report given to RN, Post -op Vital signs reviewed and stable and Patient moving all extremities X 4  Post vital signs: Reviewed and stable  Last Vitals:  Vitals Value Taken Time  BP    Temp    Pulse    Resp    SpO2      Last Pain:  Vitals:   04/21/20 0606  TempSrc: Oral  PainSc: 0-No pain         Complications: No apparent anesthesia complications

## 2020-04-21 NOTE — Discharge Instructions (Signed)
   [facility]  Discharge Instructions   Carotid Endarterectomy (CEA)  Please refer to the following instructions for your post-procedure care. Your surgeon or physician assistant will discuss any changes with you.  Activity  You are encouraged to walk as much as you can. You can slowly return to normal activities but must avoid strenuous activity and heavy lifting until your doctor tell you it's OK. Avoid activities such as vacuuming or swinging a golf club. You can drive after one week if you are comfortable and you are no longer taking prescription pain medications. It is normal to feel tired for serval weeks after your surgery. It is also normal to have difficulty with sleep habits, eating, and bowel movements after surgery. These will go away with time.  Bathing/Showering  You may shower after you come home. Do not soak in a bathtub, hot tub, or swim until the incision heals completely.  Incision Care  Shower every day. Clean your incision with mild soap and water. Pat the area dry with a clean towel. You do not need a bandage unless otherwise instructed. Do not apply any ointments or creams to your incision. You may have skin glue on your incision. Do not peel it off. It will come off on its own in about one week. Your incision may feel thickened and raised for several weeks after your surgery. This is normal and the skin will soften over time. For Men Only: It's OK to shave around the incision but do not shave the incision itself for 2 weeks. It is common to have numbness under your chin that could last for several months.  Diet  Resume your normal diet. There are no special food restrictions following this procedure. A low fat/low cholesterol diet is recommended for all patients with vascular disease. In order to heal from your surgery, it is CRITICAL to get adequate nutrition. Your body requires vitamins, minerals, and protein. Vegetables are the best  source of vitamins and minerals. Vegetables also provide the perfect balance of protein. Processed food has little nutritional value, so try to avoid this.        Medications  Resume taking all of your medications unless your doctor or physician assistant tells you not to. If your incision is causing pain, you may take over-the- counter pain relievers such as acetaminophen (Tylenol). If you were prescribed a stronger pain medication, please be aware these medications can cause nausea and constipation. Prevent nausea by taking the medication with a snack or meal. Avoid constipation by drinking plenty of fluids and eating foods with a high amount of fiber, such as fruits, vegetables, and grains. Do not take Tylenol if you are taking prescription pain medications.  Follow Up  Our office will schedule a follow up appointment 2-3 weeks following discharge.  Please call us immediately for any of the following conditions  Increased pain, redness, drainage (pus) from your incision site. Fever of 101 degrees or higher. If you should develop stroke (slurred speech, difficulty swallowing, weakness on one side of your body, loss of vision) you should call 911 and go to the nearest emergency room.  Reduce your risk of vascular disease:  Stop smoking. If you would like help call [facility] at 1-800-QUIT-NOW (1-800-784-8669) or [facility] at 336-586-4000. Manage your cholesterol Maintain a desired weight Control your diabetes Keep your blood pressure down  If you have any questions, please call the office at [phone].   

## 2020-04-22 LAB — CBC
HCT: 36.1 % (ref 36.0–46.0)
Hemoglobin: 11.5 g/dL — ABNORMAL LOW (ref 12.0–15.0)
MCH: 28.5 pg (ref 26.0–34.0)
MCHC: 31.9 g/dL (ref 30.0–36.0)
MCV: 89.6 fL (ref 80.0–100.0)
Platelets: 220 10*3/uL (ref 150–400)
RBC: 4.03 MIL/uL (ref 3.87–5.11)
RDW: 14.7 % (ref 11.5–15.5)
WBC: 10.6 10*3/uL — ABNORMAL HIGH (ref 4.0–10.5)
nRBC: 0 % (ref 0.0–0.2)

## 2020-04-22 LAB — BASIC METABOLIC PANEL
Anion gap: 8 (ref 5–15)
BUN: 15 mg/dL (ref 8–23)
CO2: 27 mmol/L (ref 22–32)
Calcium: 8.7 mg/dL — ABNORMAL LOW (ref 8.9–10.3)
Chloride: 99 mmol/L (ref 98–111)
Creatinine, Ser: 0.88 mg/dL (ref 0.44–1.00)
GFR calc Af Amer: >60 mL/min (ref >60)
GFR calc non Af Amer: >60 mL/min (ref >60)
Glucose, Bld: 176 mg/dL — ABNORMAL HIGH (ref 70–99)
Potassium: 3.7 mmol/L (ref 3.5–5.1)
Sodium: 134 mmol/L — ABNORMAL LOW (ref 135–145)

## 2020-04-22 MED ORDER — HYDROCODONE-ACETAMINOPHEN 5-325 MG PO TABS: 1.0000 | ORAL_TABLET | ORAL | 0 refills | Status: DC | PRN

## 2020-04-22 NOTE — Progress Notes (Signed)
   VASCULAR SURGERY ASSESSMENT & PLAN:   74 year old female status post right TCAR.  Hemodynamically stable and neurologically intact postop day 1.  Hemoglobin stable.  Afebrile.  She was started on Plavix preoperatively and will continue this for 30 days.  Continue also on Eliquis and aspirin.  Discharge home today.   SUBJECTIVE:   Awake and alert this morning in no apparent distress.  Denies pain, headache.  Swallowing without difficulty.  Ambulating independently.  PHYSICAL EXAM:   Vitals:   04/22/20 0200 04/22/20 0300 04/22/20 0400 04/22/20 0500  BP: (!) 127/59 129/77 (!) 120/48 (!) 111/47  Pulse:  60    Resp: 15 15 15 18   Temp:  98.3 F (36.8 C)    TempSrc:  Oral    SpO2:  97%    Weight:      Height:       General appearance: Awake and alert in no apparent distress Cardiac: Heart rate and rhythm are regular.  Telemetry normal sinus rhythm Lungs: Clear to auscultation bilaterally Neurologic: Alert and oriented x4.  Moving all extremities well.  Face is symmetrical.  Tongue is midline. Neck: Incision is well approximated without bleeding or edema.  LABS:   Lab Results  Component Value Date   WBC 10.6 (H) 04/22/2020   HGB 11.5 (L) 04/22/2020   HCT 36.1 04/22/2020   MCV 89.6 04/22/2020   PLT 220 04/22/2020   Lab Results  Component Value Date   CREATININE 0.88 04/22/2020   Lab Results  Component Value Date   INR 1.0 04/21/2020   CBG (last 3)  Recent Labs    04/21/20 0627 04/21/20 0918 04/21/20 1707  GLUCAP 147* 142* 181*    PROBLEM LIST:    Active Problems:   Carotid artery stenosis   CURRENT MEDS:   . ALPRAZolam  0.5 mg Oral QHS  . aspirin EC  81 mg Oral Daily  . atenolol  50 mg Oral BID  . celecoxib  200 mg Oral Daily  . chlorthalidone  25 mg Oral Daily  . clopidogrel  75 mg Oral Daily  . docusate sodium  100 mg Oral Daily  . gabapentin  300 mg Oral TID  . glipiZIDE  5 mg Oral BID WC  . levothyroxine  50 mcg Oral QAC breakfast  . losartan   50 mg Oral Daily  . pantoprazole  40 mg Oral Daily  . rosuvastatin  10 mg Oral QHS   Risa Grill, [place] Office: [phone] 04/22/2020

## 2020-04-22 NOTE — Progress Notes (Signed)
Pt provided discharge instructions and education. Pt IV removed and intact. Telebox 27 removed/ccmd notified. Pt vitals stable. Pt denies complaints/questions. Pt has all belongings. Pt to be tx via wheelchair to meet ride.  Jerald Kief, RN

## 2020-04-23 NOTE — Discharge Summary (Signed)
Discharge Summary     Martha Torres [birth year] 74 y.o. female  [id number]  Admission Date: 04/21/2020  Discharge Date: 04/22/2020  Physician: Dr. Servando Snare Dr. Deitra Mayo  Admission Diagnosis: Carotid artery stenosis [I65.29]   HPI:   This is a 74 y.o. female with asymptomatic recurrent right internal carotid artery stenosis.  She has previously undergone right carotid endarterectomy and this has been followed and is now reached greater than 80%.  Hospital Course:  The patient was admitted to the hospital and taken to the operating room on 04/21/2020 and underwent right sided transcarotid artery stenting.  Findings: There is a high bifurcation of the right innominate artery.  Right common carotid artery was healthy.  There was approximate 90% stenosis of the proximal ICA just distal to the bifurcation.  After stenting there is 0% residual stenosis.  The pt tolerated the procedure well and was transported to the PACU in excellent condition.   By POD 1, the pt neuro status intact.  She remained hemodynamically stable.  She was able to move all extremities well.  Maintained NSR. No difficulty swallowing.  Speech fluent.  Remained afebrile.  Right neck incision well approximated healing without signs of hematoma or infection. H and H remained stable.  The remainder of the hospital course consisted of increasing mobilization and increasing intake of solids without difficulty.   Recent Labs    04/21/20 0613 04/22/20 0345  NA 143 134*  K 4.0 3.7  CL 105 99  CO2 28 27  GLUCOSE 153* 176*  BUN 15 15  CALCIUM 9.1 8.7*   Recent Labs    04/21/20 0613 04/22/20 0345  WBC 8.9 10.6*  HGB 12.8 11.5*  HCT 42.0 36.1  PLT 204 220   Recent Labs    04/21/20 0613  INR 1.0     Discharge Instructions    Discharge patient   Complete by: As directed    Discharge disposition: 01-Home or Self Care   Discharge patient date: 04/22/2020      Discharge Diagnosis:    Carotid artery stenosis [I65.29]  Secondary Diagnosis: Patient Active Problem List   Diagnosis Date Noted  . Carotid artery stenosis 04/21/2020  . Diarrhea 12/05/2018  . Abdominal pain 12/05/2018  . Pain in joint, lower leg 11/06/2014  . Occlusion and stenosis of carotid artery without mention of cerebral infarction 08/16/2013  . Aftercare following surgery of the circulatory system, [place] 08/16/2013  . Chronic low back pain   . Hypertension   . Hyperlipidemia   . Chest pain   . Tobacco abuse   . Cerebrovascular disease   . DVT (deep venous thrombosis) ([place]) 05/08/2013  . Gastroesophageal reflux disease 05/08/2013   Past Medical History:  Diagnosis Date  . Carotid artery occlusion    Right CEA in 2002  . Chest pain 2006   Stress nuclear-anteroapical and basilar inferior reversible defects; cath-normal coronary arteries and EF  . Chronic low back pain    MRI 2014: Spondylolisthesis of L4-L5 with foraminal narrowing, most prominent on the right and facet arthropathy.  L4-L5 surgery planned for 04/2013 with left-sided pedicle screw fixation.  . Degenerative joint disease   . Depression   . Diarrhea, functional   . DVT (deep venous thrombosis) ([place])   . Essential hypertension   . Gastroesophageal reflux    H/o stricture & hiatal hernia  . History of kidney stones   . Hyperlipidemia   . Hypothyroidism   . MRSA (methicillin resistant Staphylococcus aureus) June 2014  .  Paroxysmal atrial fibrillation ([place]) 04/2013  . Type 2 diabetes mellitus ([facility])     Allergies as of 04/22/2020   No Known Allergies     Medication List    TAKE these medications   acetaminophen 650 MG CR tablet Commonly known as: TYLENOL Take 650 mg by mouth 2 (two) times daily as needed for pain.   ALPRAZolam 0.5 MG tablet Commonly known as: XANAX Take 0.5 mg by mouth at bedtime. Notes to patient: Take tonight   aspirin EC 81 MG tablet Take 81 mg by mouth daily. Notes to patient: Take tomorrow    atenolol 50 MG tablet Commonly known as: TENORMIN Take 1 tablet (50 mg total) by mouth 2 (two) times daily. Notes to patient: Take this afternoon   celecoxib 200 MG capsule Commonly known as: CELEBREX Take 200 mg by mouth daily.   chlorthalidone 25 MG tablet Commonly known as: HYGROTON Take 25 mg by mouth daily. Notes to patient: Take tomorrow   clopidogrel 75 MG tablet Commonly known as: PLAVIX Take 1 tablet (75 mg total) by mouth daily. What changed: additional instructions Notes to patient: Take tomorrow   dicyclomine 10 MG capsule Commonly known as: BENTYL Take 1 capsule (10 mg total) by mouth 4 (four) times daily as needed (diarrhea, abdominal discomfort).   diltiazem 30 MG tablet Commonly known as: CARDIZEM Take 30 mg by mouth 2 (two) times daily as needed (At onset of rapid heart rate).   gabapentin 300 MG capsule Commonly known as: NEURONTIN Take 300 mg by mouth every 8 (eight) hours. Notes to patient: Take this afternoon   glipiZIDE 5 MG 24 hr tablet Commonly known as: GLUCOTROL XL Take 5 mg by mouth 2 (two) times daily. Notes to patient: Take this afternoon   HYDROcodone-acetaminophen 5-325 MG tablet Commonly known as: NORCO/VICODIN Take 1 tablet by mouth every 4 (four) hours as needed for moderate pain.   levothyroxine 50 MCG tablet Commonly known as: SYNTHROID Take 50 mcg by mouth daily before breakfast. Notes to patient: Take tomorrow   losartan 50 MG tablet Commonly known as: COZAAR Take 50 mg by mouth daily. Notes to patient: Take tomorrow   LUBRICATING EYE DROPS OP Place 2 drops into both eyes daily as needed (dry eyes).   Magnesium 250 MG Tabs Take 250 mg by mouth 2 (two) times a day.   multivitamin with minerals Tabs tablet Take 1 tablet by mouth daily.   omeprazole 20 MG capsule Commonly known as: PRILOSEC Take 20 mg by mouth daily.   potassium chloride SA 20 MEQ tablet Commonly known as: KLOR-CON Take 2 tablets (40 mEq total) by  mouth every morning. & 20 meq in the afternoon What changed:   how much to take  when to take this  additional instructions   rivaroxaban 20 MG Tabs tablet Commonly known as: XARELTO Take 1 tablet (20 mg total) by mouth daily with supper. What changed: when to take this   rosuvastatin 10 MG tablet Commonly known as: CRESTOR Take 10 mg by mouth at bedtime. Notes to patient: Take @ bedtime   trolamine salicylate 10 % cream Commonly known as: ASPERCREME Apply 1 application topically 2 (two) times daily.   Voltaren 1 % Gel Generic drug: diclofenac Sodium Apply 2 g topically 2 (two) times daily as needed (pain).        [facility] Discharge Instructions Carotid Endarterectomy (CEA)  Please refer to the following instructions for your post-procedure care. Your Psychologist, sport and exercise or  physician assistant will discuss any changes with you.  Activity  You are encouraged to walk as much as you can. You can slowly return to normal activities but must avoid strenuous activity and heavy lifting until your doctor tell you it's OK. Avoid activities such as vacuuming or swinging a golf club. You can drive after one week if you are comfortable and you are no longer taking prescription pain medications. It is normal to feel tired for serval weeks after your surgery. It is also normal to have difficulty with sleep habits, eating, and bowel movements after surgery. These will go away with time.  Bathing/Showering  You may shower after you come home. Do not soak in a bathtub, hot tub, or swim until the incision heals completely.  Incision Care  Shower every day. Clean your incision with mild soap and water. Pat the area dry with a clean towel. You do not need a bandage unless otherwise instructed. Do not apply any ointments or creams to your incision. You may have skin glue on your incision. Do not peel it off. It will come off on its own in about one week. Your incision  may feel thickened and raised for several weeks after your surgery. This is normal and the skin will soften over time. For Men Only: It's OK to shave around the incision but do not shave the incision itself for 2 weeks. It is common to have numbness under your chin that could last for several months.  Diet  Resume your normal diet. There are no special food restrictions following this procedure. A low fat/low cholesterol diet is recommended for all patients with vascular disease. In order to heal from your surgery, it is CRITICAL to get adequate nutrition. Your body requires vitamins, minerals, and protein. Vegetables are the best source of vitamins and minerals. Vegetables also provide the perfect balance of protein. Processed food has little nutritional value, so try to avoid this.  Medications  Resume taking all of your medications unless your doctor or physician assistant tells you not to.  If your incision is causing pain, you may take over-the- counter pain relievers such as acetaminophen (Tylenol). If you were prescribed a stronger pain medication, please be aware these medications can cause nausea and constipation.  Prevent nausea by taking the medication with a snack or meal. Avoid constipation by drinking plenty of fluids and eating foods with a high amount of fiber, such as fruits, vegetables, and grains.  Do not take Tylenol if you are taking prescription pain medications.  Follow Up  Our office will schedule a follow up appointment 2-3 weeks following discharge.  Please call us immediately for any of the following conditions  . Increased pain, redness, drainage (pus) from your incision site. . Fever of 101 degrees or higher. . If you should develop stroke (slurred speech, difficulty swallowing, weakness on one side of your body, loss of vision) you should call 911 and go to the nearest emergency room. .  Reduce your risk of vascular disease:  . Stop smoking. If you would like help  call [facility] at 1-800-QUIT-NOW 419 357 7120) or [place] at [phone]. . Manage your cholesterol . Maintain a desired weight . Control your diabetes . Keep your blood pressure down .  If you have any questions, please call the office at [phone].  Prescriptions given: 1.   Norco #10 No Refill  Disposition: Home  Patient's condition: is Excellent  Follow up: 1. Dr. Scot Dock in 2 weeks.  Risa Grill, PA-C [facility] [phone]   --- For [facility] use ---   Modified Rankin score at D/C (0-6): 0  IV medication needed for:  1. Hypertension: No 2. Hypotension: No  Post-op Complications: No  1. Post-op CVA or TIA: No  If yes: Event classification (right eye, left eye, right cortical, left cortical, verterobasilar, other):   If yes: Timing of event (intra-op, <6 hrs post-op, >=6 hrs post-op, unknown):   2. CN injury: No  If yes: CN  injuried   3. Myocardial infarction: No  If yes: Dx by (EKG or clinical, Troponin):   4.  CHF: No  5.  Dysrhythmia (new): No  6. Wound infection: No  7. Reperfusion symptoms: No  8. Return to OR: No  If yes: return to OR for (bleeding, neurologic, other CEA incision, other):   Discharge medications: Statin use:  Yes ASA use:  Yes   Beta blocker use:  Yes ACE-Inhibitor use:  No  ARB use:  Yes CCB use: Yes P2Y12 Antagonist use: Yes, [ x] Plavix, [ ]  Plasugrel, [ ]  Ticlopinine, [ ]  Ticagrelor, [ ]  Other, [ ]  No for medical reason, [ ]  Non-compliant, [ ]  Not-indicated Anti-coagulant use:  Yes, [ ]  Warfarin, [x ] Rivaroxaban, [ ]  Dabigatran,

## 2020-04-28 ENCOUNTER — Other Ambulatory Visit: Payer: Self-pay | Admitting: *Deleted

## 2020-04-28 NOTE — Patient Outreach (Signed)
[place] [facility]) Care Management  04/28/2020  FREDDI HERCZEG 22-Oct-1946 [id number]  EMMI-GENERAL DISCHARGE-RESOLVED RED ON EMMI ALERT Day #1 Date:04/24/2020 Red Alert Reason: QUESTIONS/CONCERNS  OUTREACH #1 RN spoke with pt today and introduce [facility] and the purpose for today's call. RN further inquired on the above emmi as pt states she is doing well with no questions or concerns at this time. Pt verified medical appointments attendance and taking all medications with no reported issues.   PLAN: Based upon the above case will be closed with no further needs to address at this time.  Raina Mina, RN Care Management Coordinator [place] Office [phone]

## 2020-05-10 ENCOUNTER — Telehealth: Payer: Self-pay | Admitting: Vascular Surgery

## 2020-05-10 DIAGNOSIS — L309 Dermatitis, unspecified: Secondary | ICD-10-CM | POA: Diagnosis not present

## 2020-05-10 DIAGNOSIS — K625 Hemorrhage of anus and rectum: Secondary | ICD-10-CM | POA: Diagnosis not present

## 2020-05-10 NOTE — Telephone Encounter (Signed)
Called by pt at request of her primary MD regarding GI bleeding.  Pt had Right side TCAR by Dr Scot Dock about 17 days ago.  She now has a 4 day history of bleeding per rectum.  She has no dizziness or shortness of breath.  She was asked to call us regarding her anticoagulation medications.  She is currently on Xarelto for chronic paroxysmal afib.  Plavix and Aspirin were added for her carotid stent.  It would be high risk for stent thrombosis to stop all  3 of these meds at once.  I told the pt to stop the plavix.  She will continue the xarelto and aspirin for now.  She has an appt with Dr Wende Neighbors on Monday if she continues to bleed.  I discussed with her that if she has ongoing bleeding weakness or dizziness she should come to the [facility] ER for eval of her GI bleeding  Ruta Hinds, MD [facility] [place]: [phone]

## 2020-05-12 DIAGNOSIS — I480 Paroxysmal atrial fibrillation: Secondary | ICD-10-CM | POA: Diagnosis not present

## 2020-05-12 DIAGNOSIS — I6521 Occlusion and stenosis of right carotid artery: Secondary | ICD-10-CM | POA: Diagnosis not present

## 2020-05-12 DIAGNOSIS — I10 Essential (primary) hypertension: Secondary | ICD-10-CM | POA: Diagnosis not present

## 2020-05-12 DIAGNOSIS — L299 Pruritus, unspecified: Secondary | ICD-10-CM | POA: Diagnosis not present

## 2020-05-12 DIAGNOSIS — R197 Diarrhea, unspecified: Secondary | ICD-10-CM | POA: Diagnosis not present

## 2020-05-12 DIAGNOSIS — E039 Hypothyroidism, unspecified: Secondary | ICD-10-CM | POA: Diagnosis not present

## 2020-05-12 DIAGNOSIS — G9009 Other idiopathic peripheral autonomic neuropathy: Secondary | ICD-10-CM | POA: Diagnosis not present

## 2020-05-12 DIAGNOSIS — D509 Iron deficiency anemia, unspecified: Secondary | ICD-10-CM | POA: Diagnosis not present

## 2020-05-12 DIAGNOSIS — J069 Acute upper respiratory infection, unspecified: Secondary | ICD-10-CM | POA: Diagnosis not present

## 2020-05-14 ENCOUNTER — Other Ambulatory Visit: Payer: Self-pay | Admitting: *Deleted

## 2020-05-14 ENCOUNTER — Encounter: Payer: Self-pay | Admitting: Cardiology

## 2020-05-14 DIAGNOSIS — I6523 Occlusion and stenosis of bilateral carotid arteries: Secondary | ICD-10-CM

## 2020-05-14 NOTE — Progress Notes (Signed)
Cardiology Office Note  Date: 05/15/2020   ID: Martha, Torres September 26, 1946, MRN [id number]  PCP:  Celene Squibb, MD  Cardiologist:  Rozann Lesches, MD Electrophysiologist:  None   Chief Complaint  Patient presents with  . Cardiac follow-up    History of Present Illness: Martha Torres is a 74 y.o. female last assessed via telehealth encounter in November 2020.  She presents for a routine visit.  I reviewed interval records.  She continues to follow with Dr. Scot Dock.  She recently underwent right sided transcarotid artery stenting in late April.  Chart indicates recent hematochezia and discussion had with Dr. Oneida Alar regarding her current antiplatelet and anticoagulation regimen.  She was continued on Xarelto and aspirin, but taken off Plavix.  She states that she sees Dr. Scot Dock next week and would have anticipated stopping Plavix around that time anyway.  She tells me today that she saw Dr. Nevada Crane on Monday, had lab work checked with findings of progressive anemia, and was told to take iron supplements.  I am not certain whether any arrangements were made for further GI evaluation.  Chart review indicates colonoscopy with Dr. Oneida Alar in December 2019, reportedly without any acute findings.  She reports continued hematochezia on a daily basis, states that it looks like red food coloring in the toilet water, she does not report any dark tarry stools.  From a cardiac perspective she thinks that she may have had an episode of atrial fibrillation in late April when she got home from the hospital, but no symptoms since that time.  Heart rate is regular today.  Past Medical History:  Diagnosis Date  . Carotid artery disease ([place])    Right CEA in 2002; right carotid artery stent 2021  . Chest pain 2006   Stress nuclear-anteroapical and basilar inferior reversible defects; cath-normal coronary arteries and EF  . Chronic low back pain    MRI 2014: Spondylolisthesis of L4-L5 with foraminal  narrowing, most prominent on the right and facet arthropathy.  L4-L5 surgery planned for 04/2013 with left-sided pedicle screw fixation.  . Degenerative joint disease   . Depression   . Diarrhea, functional   . DVT (deep venous thrombosis) ([place])   . Essential hypertension   . Gastroesophageal reflux    H/o stricture & hiatal hernia  . History of kidney stones   . Hyperlipidemia   . Hypothyroidism   . MRSA (methicillin resistant Staphylococcus aureus) June 2014  . Paroxysmal atrial fibrillation ([place]) 04/2013  . Type 2 diabetes mellitus ([place])     Past Surgical History:  Procedure Laterality Date  . BIOPSY  02/26/2019   Procedure: BIOPSY;  Surgeon: Danie Binder, MD;  [facility];  Service: Endoscopy;;  random colon bx's  . CAROTID ENDARTERECTOMY Right 10/25/2001   Subsequent to episode of syncope  . COLONOSCOPY N/A 02/26/2019   Procedure: COLONOSCOPY;  Surgeon: Danie Binder, MD;  [facility];  Service: Endoscopy;  Laterality: N/A;  8:30am  . LAMINECTOMY  2000   L5-S1 discectomy and laminectomy; 3 other surgical procedures subsequently performed  . LAPAROSCOPIC CHOLECYSTECTOMY    . SPINE SURGERY  05-23-13   X's 5 surgeries  . TRANSCAROTID ARTERY REVASCULARIZATION Right 04/21/2020   Procedure: TRANSCAROTID ARTERY REVASCULARIZATION RIGHT;  Surgeon: Angelia Mould, MD;  Location: [facility];  Service: Vascular;  Laterality: Right;  Marland Kitchen VAGINAL HYSTERECTOMY      Current Outpatient Medications  Medication Sig Dispense Refill  . acetaminophen (TYLENOL)  650 MG CR tablet Take 650 mg by mouth 2 (two) times daily as needed for pain.     Marland Kitchen ALPRAZolam (XANAX) 0.5 MG tablet Take 0.5 mg by mouth at bedtime.     Marland Kitchen aspirin EC 81 MG tablet Take 81 mg by mouth daily.    Marland Kitchen atenolol (TENORMIN) 50 MG tablet Take 1 tablet (50 mg total) by mouth 2 (two) times daily. 180 tablet 1  . Carboxymethylcellul-Glycerin (LUBRICATING EYE DROPS OP) Place 2 drops into both eyes daily as  needed (dry eyes).    . celecoxib (CELEBREX) 200 MG capsule Take 200 mg by mouth daily.    . chlorthalidone (HYGROTON) 25 MG tablet Take 25 mg by mouth daily.     . diclofenac Sodium (VOLTAREN) 1 % GEL Apply 2 g topically 2 (two) times daily as needed (pain).     Marland Kitchen dicyclomine (BENTYL) 10 MG capsule Take 1 capsule (10 mg total) by mouth 4 (four) times daily as needed (diarrhea, abdominal discomfort). 90 capsule 2  . diltiazem (CARDIZEM) 30 MG tablet Take 30 mg by mouth 2 (two) times daily as needed (At onset of rapid heart rate).     . gabapentin (NEURONTIN) 300 MG capsule Take 300 mg by mouth every 8 (eight) hours.     Marland Kitchen glipiZIDE (GLUCOTROL XL) 5 MG 24 hr tablet Take 5 mg by mouth 2 (two) times daily.     Marland Kitchen levothyroxine (SYNTHROID, LEVOTHROID) 50 MCG tablet Take 50 mcg by mouth daily before breakfast.     . losartan (COZAAR) 50 MG tablet Take 50 mg by mouth daily.    . Magnesium 250 MG TABS Take 250 mg by mouth 2 (two) times a day.     . Multiple Vitamin (MULTIVITAMIN WITH MINERALS) TABS tablet Take 1 tablet by mouth daily.    Marland Kitchen omeprazole (PRILOSEC) 20 MG capsule Take 20 mg by mouth daily.    . potassium chloride SA (K-DUR,KLOR-CON) 20 MEQ tablet Take 2 tablets (40 mEq total) by mouth every morning. & 20 meq in the afternoon (Patient taking differently: Take 20-40 mEq by mouth See admin instructions. 40 meq in the morning and 20 meq in the bedtime) 270 tablet 3  . rivaroxaban (XARELTO) 20 MG TABS tablet Take 1 tablet (20 mg total) by mouth daily with supper. (Patient taking differently: Take 20 mg by mouth daily. ) 28 tablet 0  . rosuvastatin (CRESTOR) 10 MG tablet Take 10 mg by mouth at bedtime.    . trolamine salicylate (ASPERCREME) 10 % cream Apply 1 application topically 2 (two) times daily.    . clopidogrel (PLAVIX) 75 MG tablet Take 1 tablet (75 mg total) by mouth daily. (Patient not taking: Reported on 05/15/2020) 30 tablet 0   No current facility-administered medications for this visit.    Allergies:  Patient has no known allergies.   ROS:   No exertional chest pain.  Physical Exam: VS:  BP 128/70   Pulse 60   Ht 5\' 5"  (1.651 m)   Wt 214 lb 6.4 oz (97.3 kg)   SpO2 97%   BMI 35.68 kg/m , BMI Body mass index is 35.68 kg/m.  Wt Readings from Last 3 Encounters:  05/15/20 214 lb 6.4 oz (97.3 kg)  04/21/20 213 lb (96.6 kg)  03/26/20 220 lb (99.8 kg)    General: Patient appears comfortable at rest. HEENT: Conjunctiva and lids normal, wearing a mask. Neck: Supple, no elevated JVP or carotid bruits, no thyromegaly. Lungs: Clear to auscultation, nonlabored  breathing at rest. Cardiac: Regular rate and rhythm, no S3, soft systolic murmur, no pericardial rub. Abdomen: Soft, bowel sounds present, no guarding or rebound. Extremities: Trace ankle edema, distal pulses 2+.  ECG:  An ECG dated 04/21/2020 was personally reviewed today and demonstrated:  Normal sinus rhythm.  Recent Labwork: 04/21/2020: ALT 22; AST 22 04/22/2020: BUN 15; Creatinine, Ser 0.88; Hemoglobin 11.5; Platelets 220; Potassium 3.7; Sodium 134  June 2020: Cholesterol 194, triglycerides 196, HDL 50, LDL 105  Other Studies Reviewed Today:  No interval cardiac testing for review today.  Assessment and Plan:  1.  Paroxysmal atrial fibrillation with CHA2DS2-VASc score of 5.  She is in sinus rhythm today on examination.  Baseline regimen includes atenolol and Xarelto, she has short acting Cardizem to take as needed.  2.  Recent recurring hematochezia as discussed above.  She states that this started to occur about 2 to 3 weeks into treatment on aspirin, Plavix, and Xarelto - she underwent carotid artery stenting in late April and is on Xarelto for stroke prophylaxis with paroxysmal atrial fibrillation.  As per Dr. Eden Lathe recommendation, she stopped Plavix this past weekend.  Unfortunately, she continues to have daily hematochezia and was told that she is further anemic requiring iron supplementation per Dr. Nevada Crane  on Monday.  I have asked her to hold Xarelto for 48 hours, hopefully this will allow things to settle down further, she could potentially be having hemorrhoidal bleeding, it does not sound upper GI.  Colonoscopy from December 2019 apparently did not show any acute findings.  I have asked nursing to contact Dr. Juel Burrow office to get blood work results and also try and help to get her seen soon as possible at [facility].  3.  Mixed hyperlipidemia, on Crestor.  Medication Adjustments/Labs and Tests Ordered: Current medicines are reviewed at length with the patient today.  Concerns regarding medicines are outlined above.   Tests Ordered: Orders Placed This Encounter  Procedures  . Ambulatory referral to Gastroenterology    Medication Changes: No orders of the defined types were placed in this encounter.   Disposition:  Follow up 1 month in the [place] office.  Signed, Satira Sark, MD, [facility] 05/15/2020 8:49 AM    [facility] [place], [place], [place] [address] Phone: [phone]; Fax: [phone]

## 2020-05-15 ENCOUNTER — Encounter: Payer: Self-pay | Admitting: *Deleted

## 2020-05-15 ENCOUNTER — Other Ambulatory Visit: Payer: Self-pay | Admitting: *Deleted

## 2020-05-15 ENCOUNTER — Other Ambulatory Visit: Payer: Self-pay

## 2020-05-15 ENCOUNTER — Encounter: Payer: Self-pay | Admitting: Cardiology

## 2020-05-15 ENCOUNTER — Ambulatory Visit: Payer: Medicare Other | Admitting: Cardiology

## 2020-05-15 VITALS — BP 128/70 | HR 60 | Ht 65.0 in | Wt 214.4 lb

## 2020-05-15 DIAGNOSIS — I6523 Occlusion and stenosis of bilateral carotid arteries: Secondary | ICD-10-CM

## 2020-05-15 DIAGNOSIS — I10 Essential (primary) hypertension: Secondary | ICD-10-CM | POA: Diagnosis not present

## 2020-05-15 DIAGNOSIS — I480 Paroxysmal atrial fibrillation: Secondary | ICD-10-CM | POA: Diagnosis not present

## 2020-05-15 DIAGNOSIS — K921 Melena: Secondary | ICD-10-CM

## 2020-05-15 MED ORDER — RIVAROXABAN 20 MG PO TABS: 20.0000 mg | ORAL_TABLET | Freq: Every day | ORAL | 0 refills | Status: DC

## 2020-05-15 NOTE — Progress Notes (Signed)
Scheduled for appointment mid-next week

## 2020-05-15 NOTE — Patient Instructions (Addendum)
Medication Instructions:   Your physician has recommended you make the following change in your medication:   Hold xarelto for 48 hours then resume  Continue other medications the same  Labwork:  NONE  Testing/Procedures:  NONE  Follow-Up:  Your physician recommends that you schedule a follow-up appointment in: 1 month (office)  Any Other Special Instructions Will Be Listed Below (If Applicable).  You have been referred to [facility]-appointment Monday, May 19, 2020 @1 :30 pm-please arrive at 1:15 pm.  If you need a refill on your cardiac medications before your next appointment, please call your pharmacy.

## 2020-05-17 NOTE — Progress Notes (Signed)
Referring Provider: Celene Squibb, MD Primary Care Physician:  Celene Squibb, MD Primary GI Physician: Dr. Oneida Alar (Dr. Gala Romney following for now)  Chief Complaint  Patient presents with  . Rectal Bleeding    bleeding started after taking Plavix  . Diarrhea    occurs everyday; occurs after eating, 3-4 times after eating    HPI:   Martha Torres is a 74 y.o. female presenting today with a GI history of diarrhea and GERD.  Last seen in our office in December 2019 for GERD, diarrhea, and abdominal pain that preceded diarrhea and resolved thereafter. GERD was well controlled on Prilosec 20 mg daily.  Diarrhea had been present for over 2 years at that point with 7-8 stools a day.  Plans were to trial Bentyl 10 mg daily, check TSH and celiac panel, and proceed with colonoscopy.  TSH within normal limits but low normal at 0.97.  Negative celiac screen.  Colonoscopy March 2020 revealing tortuous left colon s/p biopsy, moderate diverticulosis in the rectosigmoid, sigmoid, descending, and transverse colon, external and internal hemorrhoids, no obvious source of diarrhea identified.  Random colon biopsies were benign.  Did not recommend repeat colonoscopy due to age.  She is advised to follow lactose-free diet or take Lactaid tablets prior to consuming dairy.  Chart review: Patient underwent right sided transcarotid artery stenting on 04/21/2020 with Dr. Scot Dock.  Plavix and aspirin were added for her carotid stent.  Chronically on Xarelto for atrial fibrillation. Telephone call on 05/10/20 to Dr. Oneida Alar with vascular surgery with patient reporting 4 days of bleeding per rectum.  She was advised to hold Plavix and continue Xarelto and aspirin.  Follow-up with cardiology 05/15/2020.  Patient reported ongoing hematochezia on a daily basis.  Stated it would like red food coloring in the toilet water.  Also reported seeing Dr. Nevada Crane on Monday and had lab work checked with findings of progressive anemia and was  told to take iron supplements.  Cardiology instructed her to hold Xarelto for 48 hours in hopes to allow things to settle down.  Also requested she get back to see GI as soon as possible and would request labs from PCP.   Lab report scanned dated 05/12/2020 with hemoglobin 12.8 (improved from 11.5 on 4/27).  Iron 38, percent saturation 11% (L), ferritin 70.  Today: Was having brbpr after starting Plavix. Stopped Plavix after talking with Dr. Oneida Alar on 5/15. Held Xarelto x 48 hours after seeing Dr. Domenic Polite 5/20. Hasn't noticed anything bright red since starting iron last Thursday (5/20). Was having bright red blood per rectum every day up to that point. Stools have turned black since starting iron. No abdominal pain. No NSAIDs. No nausea or vomiting. No GERD symptoms on omeprazole 20 mg daily.   Diarrhea: Tried Bentyl. Was taking 4-5 times a day and nothing changed. BM after meals. May have up to 6-7 BMs in a day. Depends on what she eats. Some stools are watery, some are soft/formed, some are mushy. Associated urgency. Doesn't have her gallblader. Surgery about 10 years ago. Diarrhea since she started diabetes medications years ago. No nocturnal stools. Doesn't take imodium. Ice cream occasionally. Some cheese. No milk. Fried/fatty meals about twice a week.   No lightheadedness, dizziness, pre-syncope or synciope. No CP. Intermittent palpitations related to a. Fib. No shortness of breath or cough.   Was taking celebrex daily for 2 months to help with knee pain. Has stopped this about 3 weeks ago.   Past  Medical History:  Diagnosis Date  . Carotid artery disease ([place])    Right CEA in 2002; right carotid artery stent 2021  . Chest pain 2006   Stress nuclear-anteroapical and basilar inferior reversible defects; cath-normal coronary arteries and EF  . Chronic low back pain    MRI 2014: Spondylolisthesis of L4-L5 with foraminal narrowing, most prominent on the right and facet arthropathy.  L4-L5  surgery planned for 04/2013 with left-sided pedicle screw fixation.  . Degenerative joint disease   . Depression   . Diarrhea, functional   . DVT (deep venous thrombosis) ([place])   . Essential hypertension   . Gastroesophageal reflux    H/o stricture & hiatal hernia  . History of kidney stones   . Hyperlipidemia   . Hypothyroidism   . MRSA (methicillin resistant Staphylococcus aureus) June 2014  . Paroxysmal atrial fibrillation ([place]) 04/2013  . Type 2 diabetes mellitus ([place])     Past Surgical History:  Procedure Laterality Date  . BIOPSY  02/26/2019   Procedure: BIOPSY;  Surgeon: Danie Binder, MD;  [facility];  Service: Endoscopy;;  random colon bx's  . CAROTID ENDARTERECTOMY Right 10/25/2001   Subsequent to episode of syncope  . COLONOSCOPY N/A 02/26/2019   Procedure: COLONOSCOPY;  Surgeon: Danie Binder, MD; left colon s/p biopsy, moderate diverticulosis in the rectosigmoid, sigmoid, descending, and transverse colon, external and internal hemorrhoids, no obvious source of diarrhea identified.  Random colon biopsies were benign.    Marland Kitchen LAMINECTOMY  2000   L5-S1 discectomy and laminectomy; 3 other surgical procedures subsequently performed  . LAPAROSCOPIC CHOLECYSTECTOMY    . SPINE SURGERY  05-23-13   X's 5 surgeries  . TRANSCAROTID ARTERY REVASCULARIZATION Right 04/21/2020   Procedure: TRANSCAROTID ARTERY REVASCULARIZATION RIGHT;  Surgeon: Angelia Mould, MD;  Location: [place];  Service: Vascular;  Laterality: Right;  Marland Kitchen VAGINAL HYSTERECTOMY      Current Outpatient Medications  Medication Sig Dispense Refill  . acetaminophen (TYLENOL) 650 MG CR tablet Take 650 mg by mouth 2 (two) times daily.     Marland Kitchen ALPRAZolam (XANAX) 0.5 MG tablet Take 0.5 mg by mouth at bedtime.     Marland Kitchen aspirin EC 81 MG tablet Take 81 mg by mouth daily.    Marland Kitchen atenolol (TENORMIN) 50 MG tablet Take 1 tablet (50 mg total) by mouth 2 (two) times daily. 180 tablet 1  . Carboxymethylcellul-Glycerin  (LUBRICATING EYE DROPS OP) Place 2 drops into both eyes daily as needed (dry eyes).    . chlorthalidone (HYGROTON) 25 MG tablet Take 25 mg by mouth daily.     . diclofenac Sodium (VOLTAREN) 1 % GEL Apply 2 g topically 2 (two) times daily as needed (pain).     Marland Kitchen diltiazem (CARDIZEM) 30 MG tablet Take 30 mg by mouth 2 (two) times daily as needed (At onset of rapid heart rate).     . ferrous sulfate 325 (65 FE) MG tablet Take 325 mg by mouth daily.    Marland Kitchen gabapentin (NEURONTIN) 300 MG capsule Take 300 mg by mouth every 8 (eight) hours.     Marland Kitchen glipiZIDE (GLUCOTROL XL) 5 MG 24 hr tablet Take 5 mg by mouth 2 (two) times daily.     Marland Kitchen levothyroxine (SYNTHROID, LEVOTHROID) 50 MCG tablet Take 50 mcg by mouth daily before breakfast.     . losartan (COZAAR) 50 MG tablet Take 50 mg by mouth daily.    . Multiple Vitamin (MULTIVITAMIN WITH MINERALS) TABS tablet Take 1 tablet  by mouth daily.    Marland Kitchen omeprazole (PRILOSEC) 20 MG capsule Take 20 mg by mouth daily.    . potassium chloride SA (K-DUR,KLOR-CON) 20 MEQ tablet Take 2 tablets (40 mEq total) by mouth every morning. & 20 meq in the afternoon (Patient taking differently: Take 20-40 mEq by mouth See admin instructions. 40 meq in the morning and 20 meq in the bedtime) 270 tablet 3  . rivaroxaban (XARELTO) 20 MG TABS tablet Take 1 tablet (20 mg total) by mouth daily with supper. 21 tablet 0  . rosuvastatin (CRESTOR) 10 MG tablet Take 10 mg by mouth at bedtime.    . cholestyramine (QUESTRAN) 4 g packet Take 1 packet (4 g total) by mouth daily before breakfast. 30 each 3  . clopidogrel (PLAVIX) 75 MG tablet Take 1 tablet (75 mg total) by mouth daily. (Patient not taking: Reported on 05/15/2020) 30 tablet 0   No current facility-administered medications for this visit.    Allergies as of 05/19/2020  . (No Known Allergies)    Family History  Problem Relation Age of Onset  . Heart attack Father        Also mother  . Cancer - Prostate Father   . Heart disease  Father        Heart Disease before age 90  . Hyperlipidemia Father   . Hypertension Sister   . Hyperlipidemia Sister   . Cancer Sister   . Stroke Mother   . Heart disease Mother        Heart Disease before age 87  . Hyperlipidemia Mother   . Heart attack Mother   . Diabetes Sister   . Hyperlipidemia Sister   . Hypertension Sister   . Hyperlipidemia Brother   . Cancer Brother        Luekemia  . Hypertension Brother   . Colon cancer Neg Hx     Social History   Socioeconomic History  . Marital status: Widowed    Spouse name: Not on file  . Number of children: 3  . Years of education: Not on file  . Highest education level: Not on file  Occupational History  . 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 Smoking status: Former Smoker    Packs/day: 0.25    Years: 43.00    Pack years: 10.75    Types: Cigarettes    Start date: 03/10/1965    Quit date: 11/06/2008    Years since quitting: 11.5  . Smokeless tobacco: Never Used  Substance and Sexual Activity  . Alcohol use: Never    Alcohol/week: 1.0 standard drinks    Types: 1 Standard drinks or equivalent per week  . Drug use: Never  . Sexual activity: Not on file  Other Topics Concern  . Not on file  Social History Narrative  . Not on file   Social Determinants of Health   Financial Resource Strain:   . Difficulty of Paying Living Expenses:   Food Insecurity:   . Worried About Charity fundraiser in the Last Year:   . Arboriculturist in the Last Year:   Transportation Needs:   . Film/video editor (Medical):   Marland Kitchen Lack of Transportation (Non-Medical):   Physical Activity:   . Days of Exercise per Week:   . Minutes of Exercise per Session:   Stress:   . Feeling of Stress :   Social Connections:   . Frequency of  Communication with Friends and Family:   . Frequency of Social Gatherings with Friends and Family:   . Attends Religious Services:   .  Active Member of Clubs or Organizations:   . Attends Archivist Meetings:   Marland Kitchen Marital Status:     Review of Systems: Gen: Denies fever, chills. CV: See HPI. Resp: See HPI GI: See HPI  Heme: See HPI  Physical Exam: BP (!) 149/69   Pulse 70   Temp (!) 97.3 F (36.3 C) (Temporal)   Ht 5\' 5"  (1.651 m)   Wt 215 lb 6.4 oz (97.7 kg)   BMI 35.84 kg/m  General:   Alert and oriented. No distress noted. Pleasant and cooperative.  Head:  Normocephalic and atraumatic. Eyes:  Conjuctiva clear without scleral icterus. Heart:  S1, S2 present without murmurs appreciated. Lungs:  Clear to auscultation bilaterally. No wheezes, rales, or rhonchi. No distress.  Abdomen:  +BS, soft, non-tender and non-distended. No rebound or guarding. No HSM or masses noted. Msk:  Symmetrical without gross deformities. Normal posture. Extremities:  Without edema. Neurologic:  Alert and  oriented x4 Psych:  Normal mood and affect.

## 2020-05-19 ENCOUNTER — Encounter: Payer: Self-pay | Admitting: Gastroenterology

## 2020-05-19 ENCOUNTER — Ambulatory Visit: Payer: Medicare Other | Admitting: Gastroenterology

## 2020-05-19 ENCOUNTER — Other Ambulatory Visit: Payer: Self-pay

## 2020-05-19 VITALS — BP 149/69 | HR 70 | Temp 97.3°F | Ht 65.0 in | Wt 215.4 lb

## 2020-05-19 DIAGNOSIS — K625 Hemorrhage of anus and rectum: Secondary | ICD-10-CM

## 2020-05-19 DIAGNOSIS — R197 Diarrhea, unspecified: Secondary | ICD-10-CM

## 2020-05-19 MED ORDER — CHOLESTYRAMINE 4 G PO PACK: 4.0000 g | PACK | Freq: Every day | ORAL | 3 refills | Status: DC

## 2020-05-19 NOTE — Patient Instructions (Signed)
Please have your blood work repeated in 2 weeks.  Continue to monitor closely for any bright red blood per rectum.  I suspect your black stools are secondary to iron supplement.  For diarrhea, I am starting you on Questran 4 mg daily.  Take this daily before breakfast. Administer other oral medications ?1 hour before or 4 to 6 hours after Questran.  We will plan to see back in 3 months.  Please call with questions or concerns prior.  Aliene Altes, PA-C [facility]

## 2020-05-20 ENCOUNTER — Telehealth: Payer: Self-pay | Admitting: Gastroenterology

## 2020-05-20 ENCOUNTER — Encounter: Payer: Self-pay | Admitting: Gastroenterology

## 2020-05-20 ENCOUNTER — Telehealth: Payer: Self-pay | Admitting: Cardiology

## 2020-05-20 NOTE — Assessment & Plan Note (Addendum)
Chronic diarrhea.  Patient reports symptoms started years ago after she was placed on diabetes medications.  Notably, she is also s/p cholecystectomy.  Reports up to 6-7 stools a day depending on what she eats and vary in consistency from soft/formed to watery.  Associated urgency.  Denies abdominal pain.  No nocturnal symptoms.  Bentyl was not helpful.  Prior evaluation with TSH, celiac serologies within normal limits.  TCS with benign random colon biopsies.  Suspect patient likely has bile salt diarrhea as well as possible underlying dietary intolerances.  Could also have influence of medications.  She is on magnesium which may be able to be discontinued.  Patient states cardiology prescribed this a while back and never told her to discontinue.   Trial Questran 4 g daily before breakfast.  Advised to administer other oral medications ?1 hour before or 4 to 6 hours after Questran. Follow low-fat diet. Follow lactose-free diet or take Lactaid tablets prior to dairy consumption. Advise she reach out to cardiology to clarify whether she needs to be on magnesium chronically. Follow-up in 3 months.  Call with questions or concerns prior.

## 2020-05-20 NOTE — Telephone Encounter (Signed)
Called lmom

## 2020-05-20 NOTE — Assessment & Plan Note (Addendum)
74 year old female with new onset bright red blood per rectum on 05/06/2020 after starting Plavix and aspirin due to right sided transcarotid artery stenting on 04/21/2020.  Notably, she is already anticoagulated on Xarelto for atrial fibrillation.  Per recommendations of Dr. Oneida Alar vascular surgery, patient has discontinued Plavix, held Xarelto 48 hours starting 5/20 per Dr. Domenic Polite, and has had resolution of rectal bleeding.  Labs completed with PCP 05/12/2020 with hemoglobin 12.8 (improved from 11.5 on 4/27 following carotid stenting).  Iron 38, percent saturation 11% (L), ferritin 70.  Started oral iron last week per PCP and is now having black stools.  Doubt that this is true melena as it only started after starting iron.  Only significant GI symptoms at this time includes chronic diarrhea as discussed below.  No abdominal pain.  No weight loss.  GERD well controlled on omeprazole 20 mg daily.  Had been taking Celebrex for 2 months but none in the last 3 weeks.  Colonoscopy in March 2020 with moderate diverticulosis in the rectosigmoid, sigmoid, descending, and transverse colon, external and internal hemorrhoids.  Suspect rectal bleeding was likely secondary to known hemorrhoids in the setting of diarrhea and anticoagulation/antithrombotic therapy.  Possible diverticular bleed.  Would not suspect any significant colonic etiology to have developed within the last year since her colonoscopy. As hemoglobin is within normal limits and actually improved following recent surgery, we discussed the fact that her rectal bleeding was likely trivial and can look like more than it actually is.  With recent stenting, would prefer to hold off on colonoscopy for at least 6 months.  Patient states she prefers not to have a colonoscopy unless she absolutely has to.  I feel this is reasonable considering her colonoscopy was just 1 year ago..  We will plan to recheck her CBC in 2 weeks to ensure stability and she will continue to  monitor for any return of rectal bleeding.

## 2020-05-20 NOTE — Telephone Encounter (Signed)
Communication noted.   FYI to Dr. Domenic Polite, patient and I were discussing her chronic diarrhea. Discussed that magnesium could be contributing although not suspected to the the etiology. She stated you had prescribed it and wasn't sure why, so I had just asked she follow-up to ensure it was ok to discontinue. Sorry, my note is not complete from yesterday, so the A/P isn't there.

## 2020-05-20 NOTE — Telephone Encounter (Signed)
Pt said she saw [facility] yesterday and she can not afford the prescription of cholestyramine and was there anything else she could take that wasn't so expensive. She uses [facility]. [phone]

## 2020-05-20 NOTE — Telephone Encounter (Signed)
Pt voiced understanding and says she is scheduled to have labs done again in 2 weeks and would stop taking magnesium for now - updated medication list

## 2020-05-20 NOTE — Telephone Encounter (Signed)
I did not see anything discussed in the recent GI note in regards to her magnesium supplements either.  With magnesium oxide there can be some GI irritation, perhaps that is what was discussed.  Her magnesium supplements are not essential from a cardiac perspective and could be discontinued as long as her electrolytes are followed by PCP.

## 2020-05-20 NOTE — Telephone Encounter (Signed)
I assume Colestid would also be similar in price. Anyway to inquire about this without sending in a Rx? She can try taking 1 TUMS with meals 3 times daily.

## 2020-05-20 NOTE — Telephone Encounter (Signed)
Pt says she was told by GI provider that she should clarify with Dr Domenic Polite if she should still be taking magnesium 250 mg bid - don't see anything mentioned in recent GI notes from yesterday regarding this - will forward to provider if pt should continue

## 2020-05-20 NOTE — Telephone Encounter (Signed)
Patient states that she was seen by Gastroenterologist on 05/19/2020. States that she was told to contact Dr. Domenic Polite in regards to taking Magnesium . States that [place] doctor told her that it was like taking Ibuprofen. Please leave message on Home # if she does not anwer.

## 2020-05-20 NOTE — Telephone Encounter (Signed)
With good rx coupon cholestyramine is over $200 is there anything else the pt could be placed on

## 2020-05-20 NOTE — Telephone Encounter (Signed)
In addition to  [facility], she should also follow a strict low fat diet. All meats should be lean (poultry/fish), baked, boiled or broiled. No fried/fatty/greasy foods. Also recommend lactose free diet or taking lactaid tablets prior to any dairy.

## 2020-05-21 ENCOUNTER — Ambulatory Visit (HOSPITAL_COMMUNITY)
Admission: RE | Admit: 2020-05-21 | Discharge: 2020-05-21 | Disposition: A | Discharge status: Home / Self Care | Payer: Medicare Other | Source: Ambulatory Visit | Attending: Vascular Surgery | Admitting: Vascular Surgery

## 2020-05-21 ENCOUNTER — Ambulatory Visit (INDEPENDENT_AMBULATORY_CARE_PROVIDER_SITE_OTHER): Payer: Self-pay | Admitting: Vascular Surgery

## 2020-05-21 ENCOUNTER — Other Ambulatory Visit: Payer: Self-pay

## 2020-05-21 ENCOUNTER — Encounter: Payer: Self-pay | Admitting: Vascular Surgery

## 2020-05-21 VITALS — BP 140/75 | HR 56 | Temp 97.2°F | Resp 20 | Ht 65.0 in | Wt 214.0 lb

## 2020-05-21 DIAGNOSIS — I6523 Occlusion and stenosis of bilateral carotid arteries: Secondary | ICD-10-CM | POA: Insufficient documentation

## 2020-05-21 DIAGNOSIS — Z48812 Encounter for surgical aftercare following surgery on the circulatory system: Secondary | ICD-10-CM

## 2020-05-21 NOTE — Telephone Encounter (Signed)
Noted  

## 2020-05-21 NOTE — Progress Notes (Signed)
Patient name: Martha Torres MRN: [id number] DOB: 1946/09/26 Sex: female  REASON FOR VISIT:   Follow-up after transcarotid stenting right carotid stenosis  HPI:   Martha Torres is a pleasant 74 y.o. female would undergone a right carotid endarterectomy by Dr. Amedeo Plenty in 2002.  She developed a greater than 80% recurrent right carotid stenosis.  She was felt to be a good candidate for transcarotid stenting.  She underwent transcarotid stenting of the right recurrent carotid stenosis by Dr. Servando Snare on 04/21/2020 with a 10 x 40 stent.  Of note, she had a 40 to 59% left carotid stenosis.  Of note the patient had a GI bleed and on 05/10/2020 Dr. Oneida Alar stated that she could stop her Plavix and continue her Xarelto and aspirin.  She is on Xarelto for atrial fibrillation.  The patient states that her GI bleeding has stopped.  She denies any focal weakness or paresthesias.  Current Outpatient Medications  Medication Sig Dispense Refill  . acetaminophen (TYLENOL) 650 MG CR tablet Take 650 mg by mouth 2 (two) times daily.     Marland Kitchen ALPRAZolam (XANAX) 0.5 MG tablet Take 0.5 mg by mouth at bedtime.     Marland Kitchen aspirin EC 81 MG tablet Take 81 mg by mouth daily.    Marland Kitchen atenolol (TENORMIN) 50 MG tablet Take 1 tablet (50 mg total) by mouth 2 (two) times daily. 180 tablet 1  . Carboxymethylcellul-Glycerin (LUBRICATING EYE DROPS OP) Place 2 drops into both eyes daily as needed (dry eyes).    . chlorthalidone (HYGROTON) 25 MG tablet Take 25 mg by mouth daily.     . cholestyramine (QUESTRAN) 4 g packet Take 1 packet (4 g total) by mouth daily before breakfast. 30 each 3  . diclofenac Sodium (VOLTAREN) 1 % GEL Apply 2 g topically 2 (two) times daily as needed (pain).     Marland Kitchen diltiazem (CARDIZEM) 30 MG tablet Take 30 mg by mouth 2 (two) times daily as needed (At onset of rapid heart rate).     . ferrous sulfate 325 (65 FE) MG tablet Take 325 mg by mouth daily.    Marland Kitchen gabapentin (NEURONTIN) 300 MG capsule Take 300 mg by  mouth every 8 (eight) hours.     Marland Kitchen glipiZIDE (GLUCOTROL XL) 5 MG 24 hr tablet Take 5 mg by mouth 2 (two) times daily.     Marland Kitchen levothyroxine (SYNTHROID, LEVOTHROID) 50 MCG tablet Take 50 mcg by mouth daily before breakfast.     . losartan (COZAAR) 50 MG tablet Take 50 mg by mouth daily.    . Multiple Vitamin (MULTIVITAMIN WITH MINERALS) TABS tablet Take 1 tablet by mouth daily.    Marland Kitchen omeprazole (PRILOSEC) 20 MG capsule Take 20 mg by mouth daily.    . potassium chloride SA (K-DUR,KLOR-CON) 20 MEQ tablet Take 2 tablets (40 mEq total) by mouth every morning. & 20 meq in the afternoon (Patient taking differently: Take 20-40 mEq by mouth See admin instructions. 40 meq in the morning and 20 meq in the bedtime) 270 tablet 3  . rivaroxaban (XARELTO) 20 MG TABS tablet Take 1 tablet (20 mg total) by mouth daily with supper. 21 tablet 0  . rosuvastatin (CRESTOR) 10 MG tablet Take 10 mg by mouth at bedtime.     No current facility-administered medications for this visit.    REVIEW OF SYSTEMS:  [X]  denotes positive finding, [ ]  denotes negative finding Vascular    Leg swelling    Cardiac  Chest pain or chest pressure:    Shortness of breath upon exertion:    Short of breath when lying flat:    Irregular heart rhythm:    Constitutional    Fever or chills:     PHYSICAL EXAM:   Vitals:   05/21/20 0831 05/21/20 0833  BP: 137/80 140/75  Pulse: (!) 56   Resp: 20   Temp: (!) 97.2 F (36.2 C)   SpO2: 97%   Weight: 214 lb (97.1 kg)   Height: 5\' 5"  (1.651 m)     GENERAL: The patient is a well-nourished female, in no acute distress. The vital signs are documented above. CARDIOVASCULAR: There is a regular rate and rhythm. PULMONARY: There is good air exchange bilaterally without wheezing or rales. VASCULAR: Her incision looks fine.  I do not detect carotid bruits. NEURO: She has no focal weakness or paresthesias.  DATA:   CAROTID DUPLEX: I have independently interpreted her carotid duplex scan  today.  On the right side the carotid stent is widely patent without evidence of stenosis.  The right vertebral artery is patent with antegrade flow.  On the left side there is a known 52 to 59% left carotid stenosis.  The left vertebral artery is patent with antegrade flow.  MEDICAL ISSUES:   STATUS POST TRANSCAROTID STENTING RECURRENT RIGHT CAROTID STENOSIS: Patient doing well status post transcarotid stenting on the right.  She has known 40 to 59% left carotid stenosis.  I ordered a follow-up carotid duplex scan in 9 months and I will see her back at that time.  She will remain on aspirin and a statin.  I have also discussed the importance of exercise and nutrition with her.  Deitra Mayo [facility] [phone]

## 2020-05-21 NOTE — Telephone Encounter (Signed)
Called lmom

## 2020-05-21 NOTE — Telephone Encounter (Signed)
Pt returned phone call Verified name and dob Notified her of providers recommendations Pt stated she will try the tums 3 times a day with meals because her xerelto is $120 a month and she really can not afford anymore medication right now. She also stated she saw her vascular surgeon and he stated to try "the purple diet" which Is a veggie based diet and she is going to try that. Pt stated she will call us on Monday and let us know how she is doing

## 2020-05-22 ENCOUNTER — Other Ambulatory Visit: Payer: Self-pay | Admitting: *Deleted

## 2020-05-22 DIAGNOSIS — I6523 Occlusion and stenosis of bilateral carotid arteries: Secondary | ICD-10-CM

## 2020-05-28 DIAGNOSIS — M19011 Primary osteoarthritis, right shoulder: Secondary | ICD-10-CM | POA: Diagnosis not present

## 2020-05-28 DIAGNOSIS — M19012 Primary osteoarthritis, left shoulder: Secondary | ICD-10-CM | POA: Diagnosis not present

## 2020-06-03 ENCOUNTER — Encounter: Payer: Self-pay | Admitting: Gastroenterology

## 2020-06-03 DIAGNOSIS — D649 Anemia, unspecified: Secondary | ICD-10-CM | POA: Diagnosis not present

## 2020-06-03 DIAGNOSIS — E1140 Type 2 diabetes mellitus with diabetic neuropathy, unspecified: Secondary | ICD-10-CM | POA: Diagnosis not present

## 2020-06-03 DIAGNOSIS — E039 Hypothyroidism, unspecified: Secondary | ICD-10-CM | POA: Diagnosis not present

## 2020-06-03 DIAGNOSIS — D509 Iron deficiency anemia, unspecified: Secondary | ICD-10-CM | POA: Diagnosis not present

## 2020-06-03 DIAGNOSIS — E1165 Type 2 diabetes mellitus with hyperglycemia: Secondary | ICD-10-CM | POA: Diagnosis not present

## 2020-06-04 ENCOUNTER — Other Ambulatory Visit: Payer: Self-pay

## 2020-06-04 NOTE — Progress Notes (Signed)
Noted. Will review within 5 business days.

## 2020-06-04 NOTE — Patient Instructions (Signed)
CBC w/diff/platelet, Iron/TIBC, and Ferritin results placed in [facility] box.

## 2020-06-06 NOTE — Progress Notes (Signed)
Called pt verified name and dob  Notified pt of results and recommendations  Pt stated she understood and thanked me for the call

## 2020-06-06 NOTE — Progress Notes (Signed)
Labs completed 06/03/2020: CBC: WBC 10.0, hemoglobin 13.6, MCV 90, MCH 27.8, MCHC 30.9 (L), platelets 244 Iron panel: TIBC 374, iron 164, iron saturation 42%, ferritin 48.  Lastly, please let patient know I have reviewed her labs that were completed 06/03/2020.  Hemoglobin 13.6 (normal and improved from hemoglobin 12.8 on 05/12/2020).  Her iron panel does not show any evidence of iron deficiency at this time.  Actually, her total serum iron is 164 (elevated).  Looks like she is currently taking oral iron.  Not sure who started her on this.  At this point, she should be able to discontinue oral iron.

## 2020-06-16 ENCOUNTER — Encounter: Payer: Self-pay | Admitting: Cardiology

## 2020-06-16 ENCOUNTER — Other Ambulatory Visit: Payer: Self-pay

## 2020-06-16 ENCOUNTER — Ambulatory Visit: Payer: Medicare Other | Admitting: Cardiology

## 2020-06-16 VITALS — BP 150/82 | HR 62 | Ht 65.0 in | Wt 213.0 lb

## 2020-06-16 DIAGNOSIS — E782 Mixed hyperlipidemia: Secondary | ICD-10-CM

## 2020-06-16 DIAGNOSIS — I6523 Occlusion and stenosis of bilateral carotid arteries: Secondary | ICD-10-CM | POA: Diagnosis not present

## 2020-06-16 DIAGNOSIS — I480 Paroxysmal atrial fibrillation: Secondary | ICD-10-CM | POA: Diagnosis not present

## 2020-06-16 MED ORDER — RIVAROXABAN 20 MG PO TABS: 20.0000 mg | ORAL_TABLET | Freq: Every day | ORAL | 0 refills | Status: DC

## 2020-06-16 NOTE — Progress Notes (Signed)
Cardiology Office Note  Date: 06/16/2020   ID: Martha Torres, Martha Torres [birth year], MRN [id number]  PCP:  Celene Squibb, MD  Cardiologist:  Rozann Lesches, MD Electrophysiologist:  None   Chief Complaint  Patient presents with  . Cardiac follow-up    History of Present Illness: Martha Torres is a 74 y.o. female last seen in May.  She presents for a follow-up visit.  Since last evaluation she does not report any recurring hematochezia, continues to have intermittent loose stools.  She is no longer on iron supplements or magnesium.  Interval GI evaluation reviewed.  She did have follow-up lab work on June 8 which revealed hemoglobin of 13.6 and an iron saturation of 42%.  From a cardiac perspective, she does not describe any palpitations or chest pain.  She has maintained both low-dose aspirin along with her Xarelto continues atenolol with short acting Cardizem available as well.  I note that she had follow-up with Dr. Scot Dock in late May, note reviewed.  She continues to work full-time at the [employer].  Now in the middle of summer camps.   Past Medical History:  Diagnosis Date  . Carotid artery disease ([place])    Right CEA in 2002; right carotid artery stent 2021  . Chest pain 2006   Stress nuclear-anteroapical and basilar inferior reversible defects; cath-normal coronary arteries and EF  . Chronic low back pain    MRI 2014: Spondylolisthesis of L4-L5 with foraminal narrowing, most prominent on the right and facet arthropathy.  L4-L5 surgery planned for 04/2013 with left-sided pedicle screw fixation.  . Degenerative joint disease   . Depression   . Diarrhea, functional   . DVT (deep venous thrombosis) ([place])   . Essential hypertension   . Gastroesophageal reflux    H/o stricture & hiatal hernia  . History of kidney stones   . Hyperlipidemia   . Hypothyroidism   . MRSA (methicillin resistant Staphylococcus aureus) June 2014  . Paroxysmal atrial fibrillation ([place]) 04/2013    . Type 2 diabetes mellitus ([place])     Past Surgical History:  Procedure Laterality Date  . BIOPSY  02/26/2019   Procedure: BIOPSY;  Surgeon: Danie Binder, MD;  [facility];  Service: Endoscopy;;  random colon bx's  . CAROTID ENDARTERECTOMY Right 10/25/2001   Subsequent to episode of syncope  . COLONOSCOPY N/A 02/26/2019   Procedure: COLONOSCOPY;  Surgeon: Danie Binder, MD; left colon s/p biopsy, moderate diverticulosis in the rectosigmoid, sigmoid, descending, and transverse colon, external and internal hemorrhoids, no obvious source of diarrhea identified.  Random colon biopsies were benign.    Marland Kitchen LAMINECTOMY  2000   L5-S1 discectomy and laminectomy; 3 other surgical procedures subsequently performed  . LAPAROSCOPIC CHOLECYSTECTOMY    . SPINE SURGERY  05-23-13   X's 5 surgeries  . TRANSCAROTID ARTERY REVASCULARIZATION Right 04/21/2020   Procedure: TRANSCAROTID ARTERY REVASCULARIZATION RIGHT;  Surgeon: Angelia Mould, MD;  Location: [facility];  Service: Vascular;  Laterality: Right;  Marland Kitchen VAGINAL HYSTERECTOMY      Current Outpatient Medications  Medication Sig Dispense Refill  . acetaminophen (TYLENOL) 650 MG CR tablet Take 650 mg by mouth 2 (two) times daily.     Marland Kitchen ALPRAZolam (XANAX) 0.5 MG tablet Take 0.5 mg by mouth at bedtime.     Marland Kitchen aspirin EC 81 MG tablet Take 81 mg by mouth daily.    Marland Kitchen atenolol (TENORMIN) 50 MG tablet Take 1 tablet (50 mg total) by mouth 2 (  two) times daily. 180 tablet 1  . Carboxymethylcellul-Glycerin (LUBRICATING EYE DROPS OP) Place 2 drops into both eyes daily as needed (dry eyes).    . chlorthalidone (HYGROTON) 25 MG tablet Take 25 mg by mouth daily.     . cholestyramine (QUESTRAN) 4 g packet Take 1 packet (4 g total) by mouth daily before breakfast. 30 each 3  . diclofenac Sodium (VOLTAREN) 1 % GEL Apply 2 g topically 2 (two) times daily as needed (pain).     Marland Kitchen diltiazem (CARDIZEM) 30 MG tablet Take 30 mg by mouth 2 (two) times daily as needed  (At onset of rapid heart rate).     . gabapentin (NEURONTIN) 300 MG capsule Take 300 mg by mouth every 8 (eight) hours.     Marland Kitchen glipiZIDE (GLUCOTROL XL) 5 MG 24 hr tablet Take 5 mg by mouth 2 (two) times daily.     Marland Kitchen levothyroxine (SYNTHROID, LEVOTHROID) 50 MCG tablet Take 50 mcg by mouth daily before breakfast.     . losartan (COZAAR) 50 MG tablet Take 50 mg by mouth daily.    . Multiple Vitamin (MULTIVITAMIN WITH MINERALS) TABS tablet Take 1 tablet by mouth daily.    Marland Kitchen omeprazole (PRILOSEC) 20 MG capsule Take 20 mg by mouth daily.    . potassium chloride SA (K-DUR,KLOR-CON) 20 MEQ tablet Take 2 tablets (40 mEq total) by mouth every morning. & 20 meq in the afternoon (Patient taking differently: Take 20-40 mEq by mouth See admin instructions. 40 meq in the morning and 20 meq in the bedtime) 270 tablet 3  . rivaroxaban (XARELTO) 20 MG TABS tablet Take 1 tablet (20 mg total) by mouth daily with supper. 28 tablet 0  . rosuvastatin (CRESTOR) 10 MG tablet Take 10 mg by mouth at bedtime.     No current facility-administered medications for this visit.   Allergies:  Patient has no known allergies.   ROS:  No orthopnea or PND.  Physical Exam: VS:  BP (!) 150/82   Pulse 62   Ht 5\' 5"  (1.651 m)   Wt 213 lb (96.6 kg)   SpO2 98%   BMI 35.45 kg/m , BMI Body mass index is 35.45 kg/m.  Wt Readings from Last 3 Encounters:  06/16/20 213 lb (96.6 kg)  05/21/20 214 lb (97.1 kg)  05/19/20 215 lb 6.4 oz (97.7 kg)    General: Patient appears comfortable at rest. HEENT: Conjunctiva and lids normal, wearing a mask. Neck: Supple, no elevated JVP or carotid bruits, no thyromegaly. Lungs: Clear to auscultation, nonlabored breathing at rest. Cardiac: Regular rate and rhythm, no S3, soft systolic murmur, no pericardial rub. Extremities: Trace ankle edema, distal pulses 2+.  ECG:  An ECG dated 04/21/2020 was personally reviewed today and demonstrated:  Normal sinus rhythm.  Recent Labwork: 04/21/2020: ALT  22; AST 22 04/22/2020: BUN 15; Creatinine, Ser 0.88; Hemoglobin 11.5; Platelets 220; Potassium 3.7; Sodium 134  May 2021: Hemoglobin 12.8, platelets 297, iron saturation 11%  Other Studies Reviewed Today:  Carotid Dopplers 05/21/2020: Summary:  Right Carotid: Patent internal carotid artery stent with a moderate amount  of         homogeneous plaque in the proximal segment. Velocities in  the         right ICA are consistent with a 1-39% stenosis.   Left Carotid: Velocities in the left ICA are consistent with a 40-59%  stenosis.   Vertebrals: Bilateral vertebral arteries demonstrate antegrade flow.  Subclavians: Normal flow hemodynamics were seen in  bilateral subclavian        arteries.   Assessment and Plan:  1.  Paroxysmal atrial fibrillation, CHA2DS2-VASc score is 5.  She is stable without active palpitations, heart rate regular today.  Continue atenolol with short acting Cardizem available as needed.  She remains on Xarelto for stroke prophylaxis, recent lab work reviewed.  Check CBC and BMET for next visit.  2.  Mixed hyperlipidemia, continue Crestor.  3.  Carotid artery disease status post right-sided stent intervention in April, following with Dr. Scot Dock.  She is on low-dose aspirin along with statin.  Medication Adjustments/Labs and Tests Ordered: Current medicines are reviewed at length with the patient today.  Concerns regarding medicines are outlined above.   Tests Ordered: Orders Placed This Encounter  Procedures  . CBC  . Basic Metabolic Panel (BMET)    Medication Changes: Meds ordered this encounter  Medications  . rivaroxaban (XARELTO) 20 MG TABS tablet    Sig: Take 1 tablet (20 mg total) by mouth daily with supper.    Dispense:  28 tablet    Refill:  0    Lot # [id number]  Exp 10/2020    Disposition:  Follow up 6 months in the [place] office.  Signed, Satira Sark, MD, [facility] 06/16/2020 8:30 AM    [facility] [place], [place], [place] [address] Phone: [phone]; Fax: [phone]

## 2020-06-16 NOTE — Patient Instructions (Addendum)
Your physician wants you to follow-up in: [place] will receive a reminder letter in the mail two months in advance. If you don't receive a letter, please call our office to schedule the follow-up appointment.  Your physician recommends that you continue on your current medications as directed. Please refer to the Current Medication list given to you today.  Your physician recommends that you return for lab work in: [place]   Thank you for choosing [facility]!!

## 2020-06-19 DIAGNOSIS — I10 Essential (primary) hypertension: Secondary | ICD-10-CM | POA: Diagnosis not present

## 2020-06-19 DIAGNOSIS — Z0001 Encounter for general adult medical examination with abnormal findings: Secondary | ICD-10-CM | POA: Diagnosis not present

## 2020-06-19 DIAGNOSIS — E039 Hypothyroidism, unspecified: Secondary | ICD-10-CM | POA: Diagnosis not present

## 2020-06-19 DIAGNOSIS — I6521 Occlusion and stenosis of right carotid artery: Secondary | ICD-10-CM | POA: Diagnosis not present

## 2020-06-19 DIAGNOSIS — I480 Paroxysmal atrial fibrillation: Secondary | ICD-10-CM | POA: Diagnosis not present

## 2020-06-20 DIAGNOSIS — M170 Bilateral primary osteoarthritis of knee: Secondary | ICD-10-CM | POA: Diagnosis not present

## 2020-07-14 ENCOUNTER — Telehealth: Payer: Self-pay | Admitting: Cardiology

## 2020-07-14 NOTE — Telephone Encounter (Signed)
Pt is in the doughnut hole w/ her rivaroxaban (XARELTO) 20 MG TABS tablet [[id number]]  And it's $150 a month

## 2020-07-17 MED ORDER — RIVAROXABAN 20 MG PO TABS: 20.0000 mg | ORAL_TABLET | Freq: Every day | ORAL | 0 refills | Status: DC

## 2020-07-17 NOTE — Telephone Encounter (Signed)
Pt will come by office after 330pm today to pick up samples and pt assistance form

## 2020-08-11 IMAGING — CT CT HEAD W/O CM
3 series · 16 of 47 positions shown, 19 images · non-contrast
Comparison: [HOSPITAL] [facility] CT without contrast
11/28/2017 and earlier.

CLINICAL DATA: 72-year-old female with altered mental status.
Intermittent headaches, progressive. Atrial fibrillation.
Hypertension.

EXAM:
CT HEAD WITHOUT CONTRAST
TECHNIQUE: Contiguous axial images were obtained from the base of the skull
through the vertex without intravenous contrast.

[Series 2: head wo · axial · 0.42mm/px · z∈[+1242,+1367]mm · 10 of 30 slices shown, 13 images]
[im 3/30  brain]
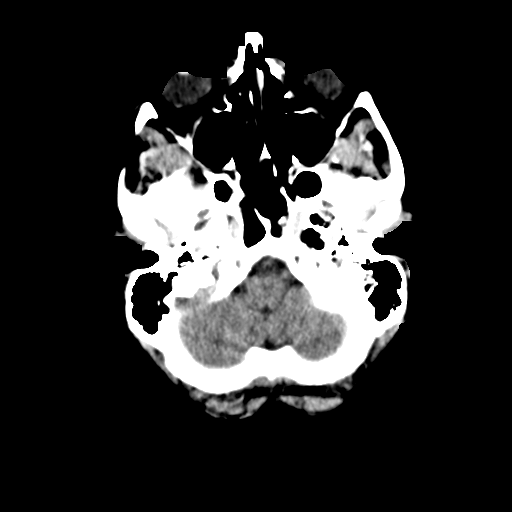
[im 3/30  bone]
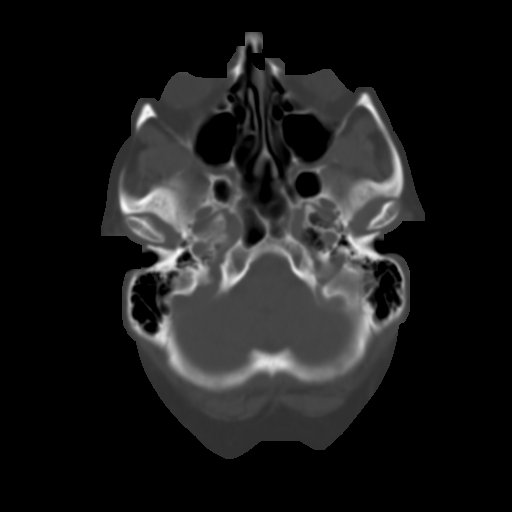
[im 6/30  brain]
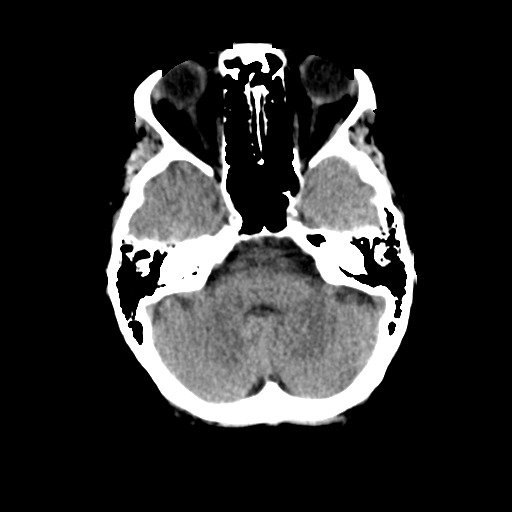
[im 9/30  brain]
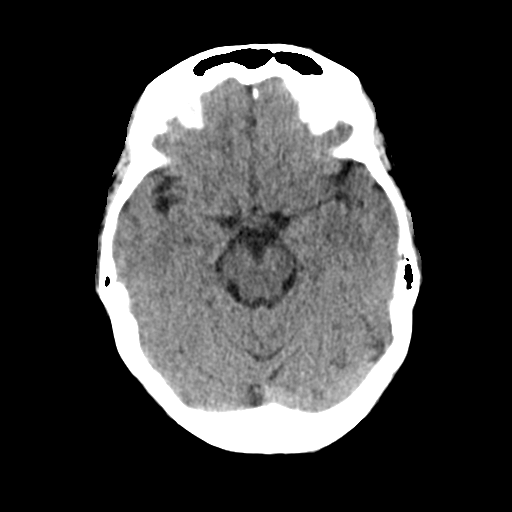
[im 11/30  brain]
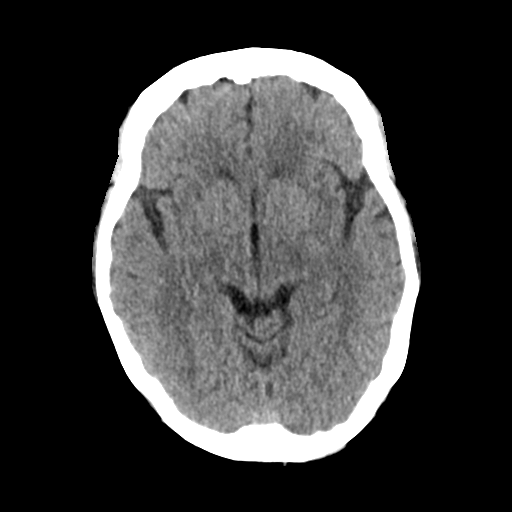
[im 14/30  brain]
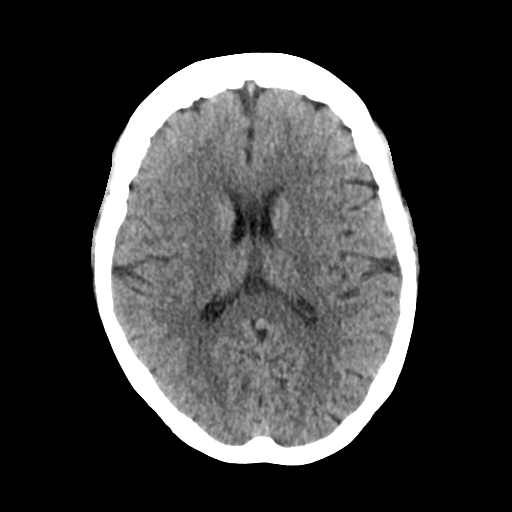
[im 14/30  bone]
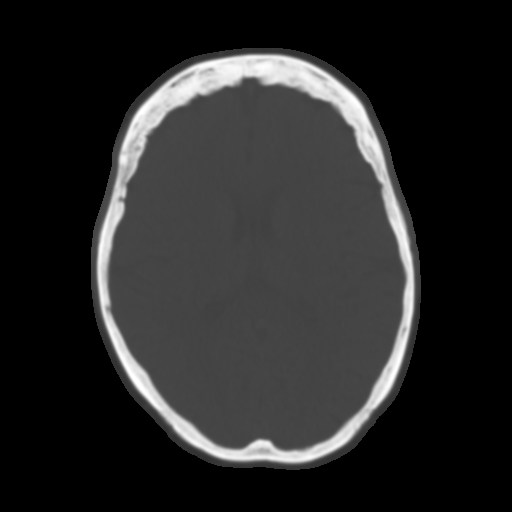
[im 17/30  brain]
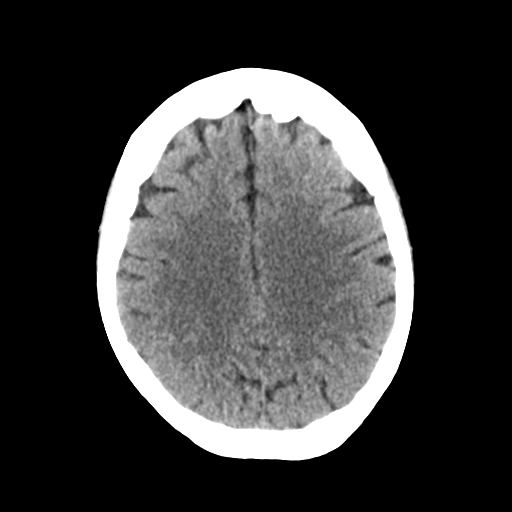
[im 20/30  brain]
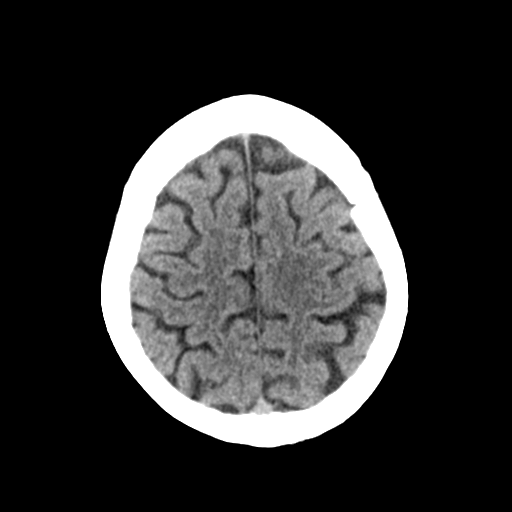
[im 23/30  brain]
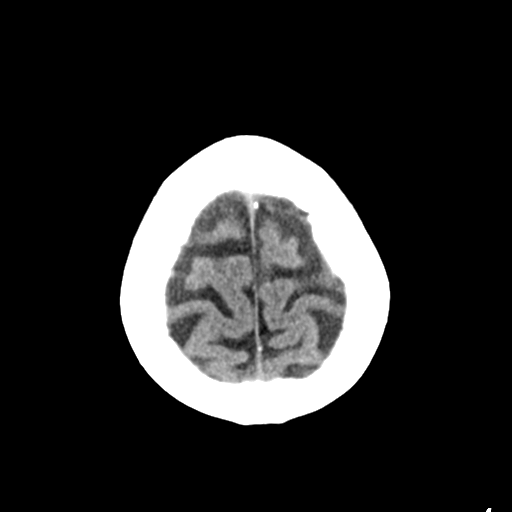
[im 25/30  brain]
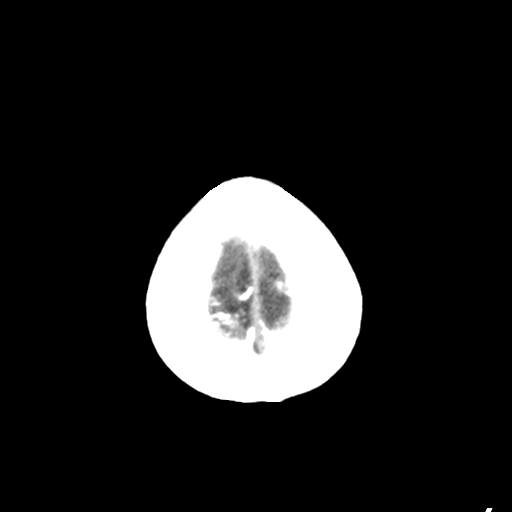
[im 25/30  bone]
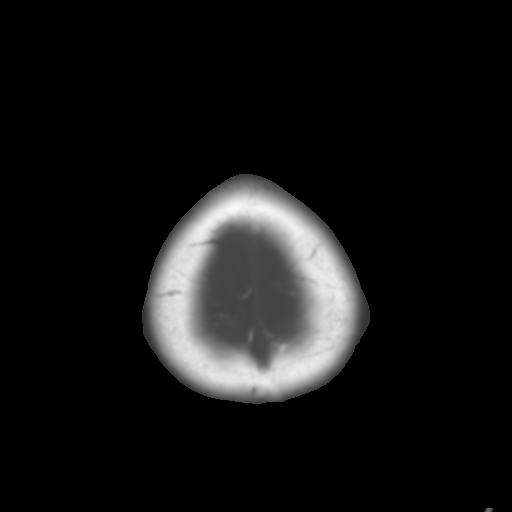
[im 28/30  brain]
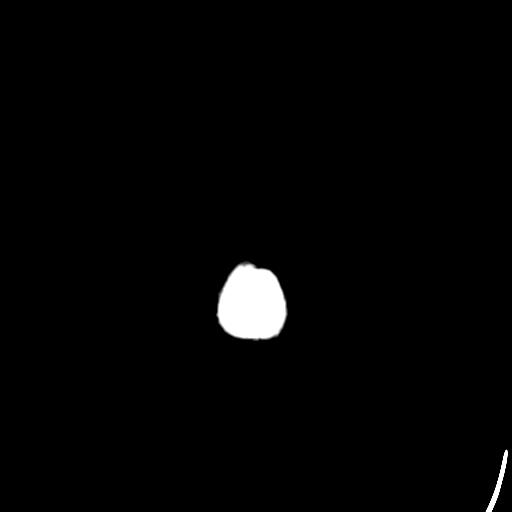

[Series 4: coronal soft tissue · coronal · 0.31mm/px · 3 of 67 slices shown]
[im 23/67  brain]
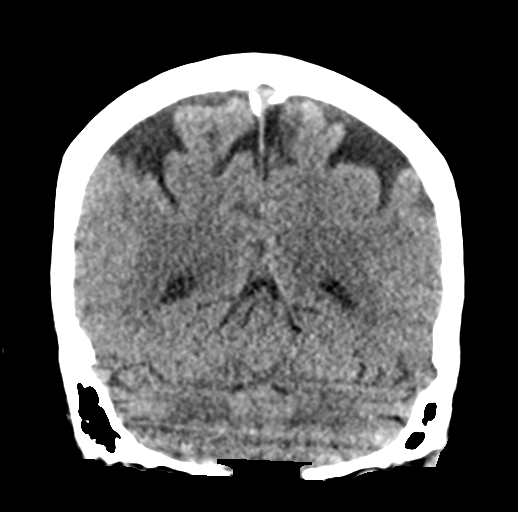
[im 30/67  brain]
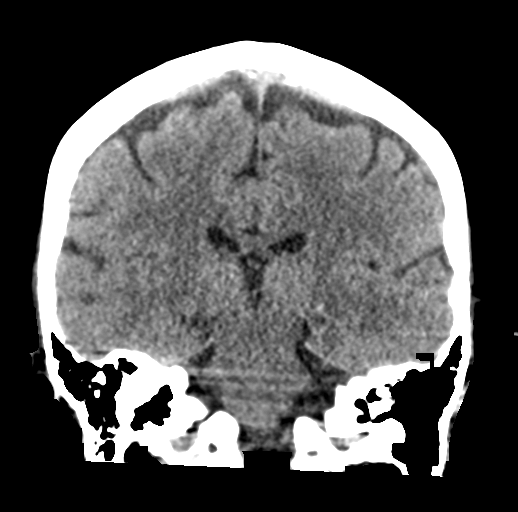
[im 37/67  brain]
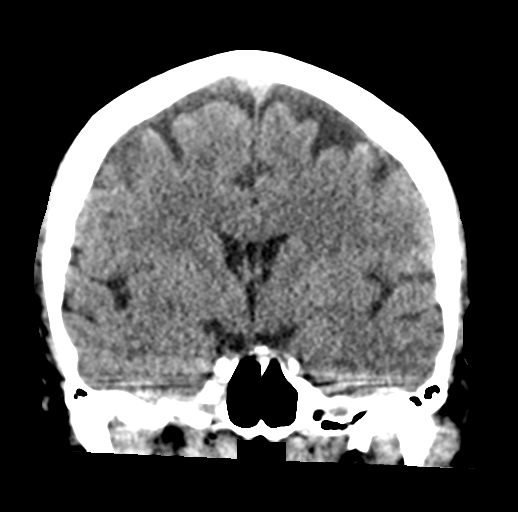

[Series 5: sagittal soft tissue · sagittal · 0.30mm/px · 3 of 55 slices shown]
[im 19/55  brain]
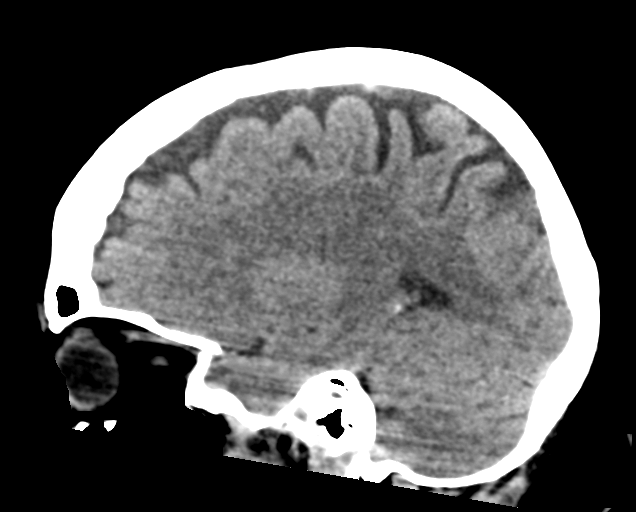
[im 28/55  brain]
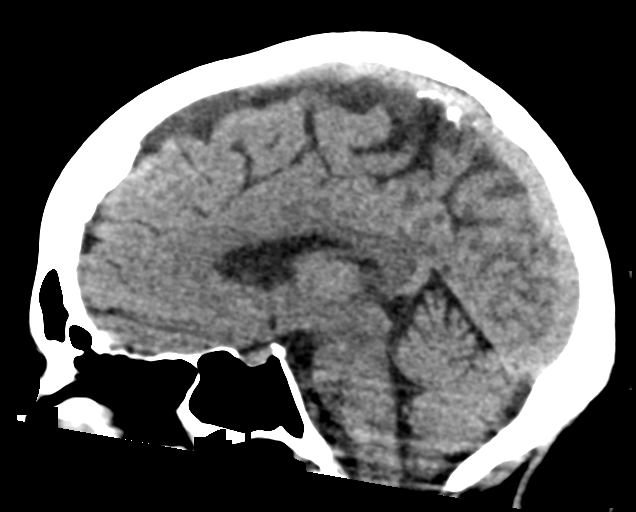
[im 37/55  brain]
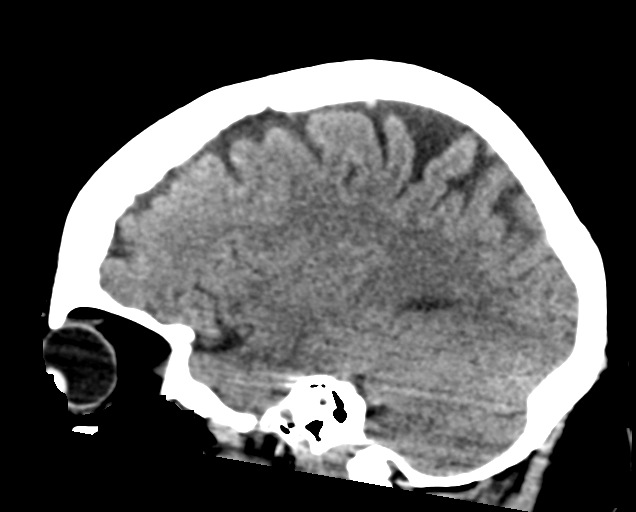

[16 of 47 positions shown; findings below may reference images not displayed]

FINDINGS: Brain: Normal cerebral volume. Cavum septum pellucidum, normal
variant. No midline shift, ventriculomegaly, mass effect, evidence
of mass lesion, intracranial hemorrhage or evidence of cortically
based acute infarction. Gray-white matter differentiation is within
normal limits throughout the brain.

Vascular: Calcified atherosclerosis at the skull base. No suspicious
intracranial vascular hyperdensity.

Skull: Negative.

Sinuses/Orbits: Visualized paranasal sinuses and mastoids are stable
and well pneumatized.

Other: Visualized orbits and scalp soft tissues are within normal
limits.
IMPRESSION: Stable and normal for age noncontrast Head CT.

## 2020-08-18 ENCOUNTER — Other Ambulatory Visit: Payer: Self-pay | Admitting: *Deleted

## 2020-08-18 MED ORDER — RIVAROXABAN 20 MG PO TABS: 20.0000 mg | ORAL_TABLET | Freq: Every day | ORAL | 0 refills | Status: DC

## 2020-08-18 NOTE — Telephone Encounter (Signed)
Pt will pick up samples of Xarelto tomorrow

## 2020-08-20 ENCOUNTER — Ambulatory Visit: Payer: Medicare Other | Admitting: Gastroenterology

## 2020-08-20 DIAGNOSIS — M170 Bilateral primary osteoarthritis of knee: Secondary | ICD-10-CM | POA: Diagnosis not present

## 2020-08-28 DIAGNOSIS — M19011 Primary osteoarthritis, right shoulder: Secondary | ICD-10-CM | POA: Diagnosis not present

## 2020-08-28 DIAGNOSIS — M19012 Primary osteoarthritis, left shoulder: Secondary | ICD-10-CM | POA: Diagnosis not present

## 2020-09-22 DIAGNOSIS — M170 Bilateral primary osteoarthritis of knee: Secondary | ICD-10-CM | POA: Diagnosis not present

## 2020-09-24 ENCOUNTER — Telehealth: Payer: Self-pay | Admitting: Cardiology

## 2020-09-24 NOTE — Telephone Encounter (Signed)
Patient called stating she's in Afib- her rate is 101. Stated she took a diltiazem (CARDIZEM) 30 MG tablet [[id number]]  At 6:00 this morning. Her rate at 7:00 it was 96. States she feels kind of bad and her stomach is messed up.   Would like to know if she can take another diltiazem (CARDIZEM) 30 MG or if she should wait a little longer, since she took the first one at [place] no chest pain, dizziness, or SOB  Had Cortizone shot in her knee on Monday and her blood sugar has been elevated so she has a headache.   Please call [phone]

## 2020-09-24 NOTE — Telephone Encounter (Signed)
Reports this morning when she got up to use the bathroom, she felt her heart beating fast and she felt pain in her chest during that time. Says she felt this same way in the past when she was in a-fib. Reports HR was 141 at 5:30 am during that time. Reports taking one diltiazem 30 mg and HR came down to 96. Denies active chest pain or feelings of her heart beating fast. Reports taking all of her regular medications this morning since that time. Now BP is 131/73 & HR 72 which was taken while talking with patient.  Advised that she didn't need to take an additional 30 mg diltiazem now since her symptoms had stopped.  Advised that she can take it up to 2 times per day but should be at least 6 hours apart unless she is told to take it sooner by her provider.  Verbalized understanding.

## 2020-09-29 DIAGNOSIS — D509 Iron deficiency anemia, unspecified: Secondary | ICD-10-CM | POA: Diagnosis not present

## 2020-09-29 DIAGNOSIS — E039 Hypothyroidism, unspecified: Secondary | ICD-10-CM | POA: Diagnosis not present

## 2020-09-29 DIAGNOSIS — D649 Anemia, unspecified: Secondary | ICD-10-CM | POA: Diagnosis not present

## 2020-09-29 DIAGNOSIS — E1165 Type 2 diabetes mellitus with hyperglycemia: Secondary | ICD-10-CM | POA: Diagnosis not present

## 2020-09-29 DIAGNOSIS — E1140 Type 2 diabetes mellitus with diabetic neuropathy, unspecified: Secondary | ICD-10-CM | POA: Diagnosis not present

## 2020-10-02 DIAGNOSIS — Z23 Encounter for immunization: Secondary | ICD-10-CM | POA: Diagnosis not present

## 2020-10-02 DIAGNOSIS — E039 Hypothyroidism, unspecified: Secondary | ICD-10-CM | POA: Diagnosis not present

## 2020-10-02 DIAGNOSIS — I10 Essential (primary) hypertension: Secondary | ICD-10-CM | POA: Diagnosis not present

## 2020-10-02 DIAGNOSIS — I480 Paroxysmal atrial fibrillation: Secondary | ICD-10-CM | POA: Diagnosis not present

## 2020-10-02 DIAGNOSIS — E782 Mixed hyperlipidemia: Secondary | ICD-10-CM | POA: Diagnosis not present

## 2020-10-13 DIAGNOSIS — K219 Gastro-esophageal reflux disease without esophagitis: Secondary | ICD-10-CM | POA: Diagnosis not present

## 2020-10-13 DIAGNOSIS — E039 Hypothyroidism, unspecified: Secondary | ICD-10-CM | POA: Diagnosis not present

## 2020-10-13 DIAGNOSIS — E782 Mixed hyperlipidemia: Secondary | ICD-10-CM | POA: Diagnosis not present

## 2020-10-13 DIAGNOSIS — I10 Essential (primary) hypertension: Secondary | ICD-10-CM | POA: Diagnosis not present

## 2020-10-13 DIAGNOSIS — I480 Paroxysmal atrial fibrillation: Secondary | ICD-10-CM | POA: Diagnosis not present

## 2020-11-05 ENCOUNTER — Telehealth: Payer: Self-pay | Admitting: Cardiology

## 2020-11-05 MED ORDER — RIVAROXABAN 20 MG PO TABS: 20.0000 mg | ORAL_TABLET | Freq: Every day | ORAL | 0 refills | Status: DC

## 2020-11-05 NOTE — Telephone Encounter (Signed)
Samples available for pick up as patient is in the donut hole

## 2020-11-05 NOTE — Telephone Encounter (Signed)
Patient calling the office for samples of medication:   1.  What medication and dosage are you requesting samples for?  XARELTO 20   2.  Are you currently out of this medication?

## 2020-11-28 DIAGNOSIS — M19011 Primary osteoarthritis, right shoulder: Secondary | ICD-10-CM | POA: Diagnosis not present

## 2020-11-28 DIAGNOSIS — M19012 Primary osteoarthritis, left shoulder: Secondary | ICD-10-CM | POA: Diagnosis not present

## 2020-12-14 NOTE — Progress Notes (Signed)
Cardiology Office Note  Date: 12/15/2020   ID: Falyn, Rubel 12-10-1946, MRN [id number]  PCP:  Celene Squibb, MD  Cardiologist:  Rozann Lesches, MD Electrophysiologist:  None   Chief Complaint  Patient presents with  . Cardiac follow-up    History of Present Illness: Martha Torres is a 74 y.o. female last seen in June.  She presents for a routine visit.  She does not report any palpitations or chest pain.  Has been experiencing recurring leg cramps at nighttime.  She is going to her PCP office today to have electrolytes checked.  I reviewed her lab work from October as noted below.  Hemoglobin was normal at 14.2 and creatinine 0.75.  She does not report any spontaneous bleeding problems or changes in stool on Xarelto.  Past Medical History:  Diagnosis Date  . Carotid artery disease ([place])    Right CEA in 2002; right carotid artery stent 2021  . Chest pain 2006   Stress nuclear-anteroapical and basilar inferior reversible defects; cath-normal coronary arteries and EF  . Chronic low back pain    MRI 2014: Spondylolisthesis of L4-L5 with foraminal narrowing, most prominent on the right and facet arthropathy.  L4-L5 surgery planned for 04/2013 with left-sided pedicle screw fixation.  . Degenerative joint disease   . Depression   . Diarrhea, functional   . DVT (deep venous thrombosis) ([place])   . Essential hypertension   . Gastroesophageal reflux    H/o stricture & hiatal hernia  . History of kidney stones   . Hyperlipidemia   . Hypothyroidism   . MRSA (methicillin resistant Staphylococcus aureus) June 2014  . Paroxysmal atrial fibrillation ([place]) 04/2013  . Type 2 diabetes mellitus ([place])     Past Surgical History:  Procedure Laterality Date  . BIOPSY  02/26/2019   Procedure: BIOPSY;  Surgeon: Danie Binder, MD;  [facility];  Service: Endoscopy;;  random colon bx's  . CAROTID ENDARTERECTOMY Right 10/25/2001   Subsequent to episode of syncope  .  COLONOSCOPY N/A 02/26/2019   Procedure: COLONOSCOPY;  Surgeon: Danie Binder, MD; left colon s/p biopsy, moderate diverticulosis in the rectosigmoid, sigmoid, descending, and transverse colon, external and internal hemorrhoids, no obvious source of diarrhea identified.  Random colon biopsies were benign.    Marland Kitchen LAMINECTOMY  2000   L5-S1 discectomy and laminectomy; 3 other surgical procedures subsequently performed  . LAPAROSCOPIC CHOLECYSTECTOMY    . SPINE SURGERY  05-23-13   X's 5 surgeries  . TRANSCAROTID ARTERY REVASCULARIZATION Right 04/21/2020   Procedure: TRANSCAROTID ARTERY REVASCULARIZATION RIGHT;  Surgeon: Angelia Mould, MD;  Location: [facility];  Service: Vascular;  Laterality: Right;  Marland Kitchen VAGINAL HYSTERECTOMY      Current Outpatient Medications  Medication Sig Dispense Refill  . acetaminophen (TYLENOL) 650 MG CR tablet Take 650 mg by mouth 2 (two) times daily.    Marland Kitchen ALPRAZolam (XANAX) 0.5 MG tablet Take 0.5 mg by mouth at bedtime.    Marland Kitchen aspirin EC 81 MG tablet Take 81 mg by mouth daily.    Marland Kitchen atenolol (TENORMIN) 50 MG tablet Take 1 tablet (50 mg total) by mouth 2 (two) times daily. 180 tablet 1  . Carboxymethylcellul-Glycerin (LUBRICATING EYE DROPS OP) Place 2 drops into both eyes daily as needed (dry eyes).    . chlorthalidone (HYGROTON) 25 MG tablet Take 25 mg by mouth daily.     . diclofenac Sodium (VOLTAREN) 1 % GEL Apply 2 g topically 2 (two) times  daily as needed (pain).     Marland Kitchen diltiazem (CARDIZEM) 30 MG tablet Take 30 mg by mouth 2 (two) times daily as needed (At onset of rapid heart rate).     . gabapentin (NEURONTIN) 300 MG capsule Take 300 mg by mouth every 8 (eight) hours.     Marland Kitchen glipiZIDE (GLUCOTROL XL) 5 MG 24 hr tablet Take 5 mg by mouth 2 (two) times daily.     Marland Kitchen levothyroxine (SYNTHROID, LEVOTHROID) 50 MCG tablet Take 50 mcg by mouth daily before breakfast.     . losartan (COZAAR) 50 MG tablet Take 50 mg by mouth daily.    . Multiple Vitamin (MULTIVITAMIN WITH  MINERALS) TABS tablet Take 1 tablet by mouth daily.    Marland Kitchen omeprazole (PRILOSEC) 20 MG capsule Take 20 mg by mouth daily.    . potassium chloride SA (K-DUR,KLOR-CON) 20 MEQ tablet Take 2 tablets (40 mEq total) by mouth every morning. & 20 meq in the afternoon (Patient taking differently: Take 20-40 mEq by mouth See admin instructions. 40 meq in the morning and 20 meq in the bedtime) 270 tablet 3  . rivaroxaban (XARELTO) 20 MG TABS tablet Take 1 tablet (20 mg total) by mouth daily with supper. 42 tablet 0  . rosuvastatin (CRESTOR) 10 MG tablet Take 10 mg by mouth at bedtime.     No current facility-administered medications for this visit.   Allergies:  Patient has no known allergies.   ROS: No dizziness or syncope.  Physical Exam: VS:  BP 118/70   Pulse 68   Ht 5\' 5"  (1.651 m)   Wt 211 lb 6.4 oz (95.9 kg)   SpO2 97%   BMI 35.18 kg/m , BMI Body mass index is 35.18 kg/m.  Wt Readings from Last 3 Encounters:  12/15/20 211 lb 6.4 oz (95.9 kg)  06/16/20 213 lb (96.6 kg)  05/21/20 214 lb (97.1 kg)    General: Patient appears comfortable at rest. HEENT: Conjunctiva and lids normal, wearing a mask. Neck: Supple, no elevated JVP or carotid bruits, no thyromegaly. Lungs: Clear to auscultation, nonlabored breathing at rest. Cardiac: Regular rate and rhythm, no S3, soft systolic murmur. Extremities: No pitting edema.  ECG:  An ECG dated 04/21/2020 was personally reviewed today and demonstrated:  Normal sinus rhythm.  Recent Labwork: 04/21/2020: ALT 22; AST 22 04/22/2020: BUN 15; Creatinine, Ser 0.88; Hemoglobin 11.5; Platelets 220; Potassium 3.7; Sodium 134  October 2021: Hemoglobin 14.2, platelets 231, BUN 17, creatinine 0.75, potassium 4.5, AST 17, ALT 21, cholesterol 152, triglycerides 179, HDL 51, LDL 71, hemoglobin A1c 7.2%  Other Studies Reviewed Today:  Carotid Dopplers 05/21/2020: Summary:  Right Carotid: Patent internal carotid artery stent with a moderate amount  of          homogeneous plaque in the proximal segment. Velocities in  the         right ICA are consistent with a 1-39% stenosis.   Left Carotid: Velocities in the left ICA are consistent with a 40-59%  stenosis.   Vertebrals: Bilateral vertebral arteries demonstrate antegrade flow.  Subclavians: Normal flow hemodynamics were seen in bilateral subclavian        arteries.   Assessment and Plan:  1.  Paroxysmal atrial fibrillation.  CHA2DS2-VASc score is 5.  She is doing well without significant sense of palpitations, continues on atenolol with short acting Cardizem as needed.  She remains on Xarelto with no reported spontaneous bleeding problems, lab work from October stable.  Samples provided today.  2.  Mixed hyperlipidemia on Crestor.  If no other obvious cause for recurring leg cramps is uncovered, could consider trial off Crestor to see if this is related and then picking a different statin if necessary.  3.  Carotid artery disease status post right-sided stent intervention.  She continues to follow with Dr. Scot Dock.  Medication Adjustments/Labs and Tests Ordered: Current medicines are reviewed at length with the patient today.  Concerns regarding medicines are outlined above.   Tests Ordered: No orders of the defined types were placed in this encounter.   Medication Changes: No orders of the defined types were placed in this encounter.   Disposition:  Follow up 6 months in the [place] office.  Signed, Satira Sark, MD, [facility] 12/15/2020 8:36 AM    [facility] at [place], [place], [place] [address] Phone: [phone]; Fax: [phone]

## 2020-12-15 ENCOUNTER — Other Ambulatory Visit: Payer: Self-pay | Admitting: *Deleted

## 2020-12-15 ENCOUNTER — Ambulatory Visit (INDEPENDENT_AMBULATORY_CARE_PROVIDER_SITE_OTHER): Payer: Medicare Other | Admitting: Cardiology

## 2020-12-15 ENCOUNTER — Encounter: Payer: Self-pay | Admitting: Cardiology

## 2020-12-15 VITALS — BP 118/70 | HR 68 | Ht 65.0 in | Wt 211.4 lb

## 2020-12-15 DIAGNOSIS — I480 Paroxysmal atrial fibrillation: Secondary | ICD-10-CM | POA: Diagnosis not present

## 2020-12-15 DIAGNOSIS — Z7901 Long term (current) use of anticoagulants: Secondary | ICD-10-CM | POA: Diagnosis not present

## 2020-12-15 DIAGNOSIS — H8110 Benign paroxysmal vertigo, unspecified ear: Secondary | ICD-10-CM | POA: Diagnosis not present

## 2020-12-15 DIAGNOSIS — E739 Lactose intolerance, unspecified: Secondary | ICD-10-CM | POA: Diagnosis not present

## 2020-12-15 DIAGNOSIS — I6523 Occlusion and stenosis of bilateral carotid arteries: Secondary | ICD-10-CM

## 2020-12-15 DIAGNOSIS — L309 Dermatitis, unspecified: Secondary | ICD-10-CM | POA: Diagnosis not present

## 2020-12-15 DIAGNOSIS — E782 Mixed hyperlipidemia: Secondary | ICD-10-CM | POA: Diagnosis not present

## 2020-12-15 DIAGNOSIS — R197 Diarrhea, unspecified: Secondary | ICD-10-CM | POA: Diagnosis not present

## 2020-12-15 MED ORDER — RIVAROXABAN 20 MG PO TABS: 20.0000 mg | ORAL_TABLET | Freq: Every day | ORAL | 0 refills | Status: DC

## 2020-12-15 NOTE — Patient Instructions (Addendum)

## 2020-12-22 DIAGNOSIS — M170 Bilateral primary osteoarthritis of knee: Secondary | ICD-10-CM | POA: Diagnosis not present

## 2020-12-26 DIAGNOSIS — E782 Mixed hyperlipidemia: Secondary | ICD-10-CM | POA: Diagnosis not present

## 2020-12-26 DIAGNOSIS — K219 Gastro-esophageal reflux disease without esophagitis: Secondary | ICD-10-CM | POA: Diagnosis not present

## 2020-12-26 DIAGNOSIS — E039 Hypothyroidism, unspecified: Secondary | ICD-10-CM | POA: Diagnosis not present

## 2020-12-26 DIAGNOSIS — I10 Essential (primary) hypertension: Secondary | ICD-10-CM | POA: Diagnosis not present

## 2021-01-06 DIAGNOSIS — Z23 Encounter for immunization: Secondary | ICD-10-CM | POA: Diagnosis not present

## 2021-01-20 DIAGNOSIS — E119 Type 2 diabetes mellitus without complications: Secondary | ICD-10-CM | POA: Diagnosis not present

## 2021-01-20 DIAGNOSIS — H8110 Benign paroxysmal vertigo, unspecified ear: Secondary | ICD-10-CM | POA: Diagnosis not present

## 2021-01-20 DIAGNOSIS — M25519 Pain in unspecified shoulder: Secondary | ICD-10-CM | POA: Diagnosis not present

## 2021-01-20 DIAGNOSIS — E739 Lactose intolerance, unspecified: Secondary | ICD-10-CM | POA: Diagnosis not present

## 2021-01-20 DIAGNOSIS — R197 Diarrhea, unspecified: Secondary | ICD-10-CM | POA: Diagnosis not present

## 2021-01-20 DIAGNOSIS — Z7901 Long term (current) use of anticoagulants: Secondary | ICD-10-CM | POA: Diagnosis not present

## 2021-01-20 DIAGNOSIS — I6521 Occlusion and stenosis of right carotid artery: Secondary | ICD-10-CM | POA: Diagnosis not present

## 2021-01-20 DIAGNOSIS — L309 Dermatitis, unspecified: Secondary | ICD-10-CM | POA: Diagnosis not present

## 2021-01-26 DIAGNOSIS — E782 Mixed hyperlipidemia: Secondary | ICD-10-CM | POA: Diagnosis not present

## 2021-01-26 DIAGNOSIS — R197 Diarrhea, unspecified: Secondary | ICD-10-CM | POA: Diagnosis not present

## 2021-01-26 DIAGNOSIS — E039 Hypothyroidism, unspecified: Secondary | ICD-10-CM | POA: Diagnosis not present

## 2021-01-26 DIAGNOSIS — I739 Peripheral vascular disease, unspecified: Secondary | ICD-10-CM | POA: Diagnosis not present

## 2021-01-26 DIAGNOSIS — I2510 Atherosclerotic heart disease of native coronary artery without angina pectoris: Secondary | ICD-10-CM | POA: Diagnosis not present

## 2021-01-26 DIAGNOSIS — D72829 Elevated white blood cell count, unspecified: Secondary | ICD-10-CM | POA: Diagnosis not present

## 2021-01-26 DIAGNOSIS — E1165 Type 2 diabetes mellitus with hyperglycemia: Secondary | ICD-10-CM | POA: Diagnosis not present

## 2021-01-26 DIAGNOSIS — K219 Gastro-esophageal reflux disease without esophagitis: Secondary | ICD-10-CM | POA: Diagnosis not present

## 2021-01-26 DIAGNOSIS — G4700 Insomnia, unspecified: Secondary | ICD-10-CM | POA: Diagnosis not present

## 2021-01-26 DIAGNOSIS — I10 Essential (primary) hypertension: Secondary | ICD-10-CM | POA: Diagnosis not present

## 2021-01-26 DIAGNOSIS — G9009 Other idiopathic peripheral autonomic neuropathy: Secondary | ICD-10-CM | POA: Diagnosis not present

## 2021-02-18 ENCOUNTER — Encounter: Payer: Self-pay | Admitting: Vascular Surgery

## 2021-02-18 ENCOUNTER — Ambulatory Visit (HOSPITAL_COMMUNITY)
Admission: RE | Admit: 2021-02-18 | Discharge: 2021-02-18 | Disposition: A | Discharge status: Home / Self Care | Payer: Medicare Other | Source: Ambulatory Visit | Attending: Vascular Surgery | Admitting: Vascular Surgery

## 2021-02-18 ENCOUNTER — Ambulatory Visit: Payer: Medicare Other | Admitting: Vascular Surgery

## 2021-02-18 ENCOUNTER — Other Ambulatory Visit: Payer: Self-pay

## 2021-02-18 VITALS — BP 139/77 | HR 59 | Temp 98.0°F | Resp 20 | Ht 65.0 in | Wt 209.0 lb

## 2021-02-18 DIAGNOSIS — I6523 Occlusion and stenosis of bilateral carotid arteries: Secondary | ICD-10-CM | POA: Diagnosis not present

## 2021-02-18 NOTE — Progress Notes (Signed)
REASON FOR VISIT:   Follow-up of bilateral carotid disease  MEDICAL ISSUES:   BILATERAL CAROTID DISEASE: The patient is doing well status post transcarotid stenting.  The right carotid stent is widely patent.  We have been following a moderate 40 to 59% left carotid stenosis which is stable.  She is asymptomatic.  She understands we would not consider left carotid endarterectomy unless the stenosis progressed to greater than 80% or she develop new neurologic symptoms.  She does have some dizziness and vertigo however her vertebral arteries are patent with antegrade flow so there is no evidence of vertebrobasilar insufficiency.  She is on aspirin and is on a statin.  I will see her back in 1 year.  She knows to call sooner if she has problems.  CHRONIC VENOUS INSUFFICIENCY: She does have some evidence of chronic venous insufficiency on exam.  We have discussed the importance of intermittent leg elevation of the proper positioning for this.  Currently she does not have significant leg swelling.   HPI:   Martha Torres is a pleasant 75 y.o. female who underwent a right carotid endarterectomy by Dr. Amedeo Plenty in 2002.  She developed a greater than 80% recurrent right carotid stenosis.  She was felt to be a good candidate for transcarotid stenting and underwent transcarotid stenting on the right on 04/21/2020.  We were also following a 40 to 59% left carotid stenosis.  She had a GI bleed on 05/10/2020 and at that time we noted that it was safe to stop her Plavix and she was going to continue her Xarelto and aspirin.  She has a history of atrial fibrillation.  Since I saw her last, she denies any focal weakness or paresthesias.  She has no expressive or receptive aphasia or amaurosis fugax.  I do not get any history of claudication, rest pain, or nonhealing ulcers.  She does have arthritis in both knees.  Past Medical History:  Diagnosis Date  . Carotid artery disease ([place])    Right CEA in 2002;  right carotid artery stent 2021  . Chest pain 2006   Stress nuclear-anteroapical and basilar inferior reversible defects; cath-normal coronary arteries and EF  . Chronic low back pain    MRI 2014: Spondylolisthesis of L4-L5 with foraminal narrowing, most prominent on the right and facet arthropathy.  L4-L5 surgery planned for 04/2013 with left-sided pedicle screw fixation.  . Degenerative joint disease   . Depression   . Diarrhea, functional   . DVT (deep venous thrombosis) ([place])   . Essential hypertension   . Gastroesophageal reflux    H/o stricture & hiatal hernia  . History of kidney stones   . Hyperlipidemia   . Hypothyroidism   . MRSA (methicillin resistant Staphylococcus aureus) June 2014  . Paroxysmal atrial fibrillation ([place]) 04/2013  . Type 2 diabetes mellitus ([facility])     Family History  Problem Relation Age of Onset  . Heart attack Father        Also mother  . Cancer - Prostate Father   . Heart disease Father        Heart Disease before age 29  . Hyperlipidemia Father   . Hypertension Sister   . Hyperlipidemia Sister   . Cancer Sister   . Stroke Mother   . Heart disease Mother        Heart Disease before age 34  . Hyperlipidemia Mother   . Heart attack Mother   . Diabetes Sister   . Hyperlipidemia  Sister   . Hypertension Sister   . Hyperlipidemia Brother   . Cancer Brother        Luekemia  . Hypertension Brother   . Colon cancer Neg Hx     SOCIAL HISTORY: Social History   Tobacco Use  . Smoking status: Former Smoker    Packs/day: 0.25    Years: 43.00    Pack years: 10.75    Types: Cigarettes    Start date: 03/10/1965    Quit date: 11/06/2008    Years since quitting: 12.2  . Smokeless tobacco: Never Used  Substance Use Topics  . Alcohol use: Never    Alcohol/week: 1.0 standard drink    Types: 1 Standard drinks or equivalent per week    No Known Allergies  Current Outpatient Medications  Medication Sig Dispense Refill  . acetaminophen (TYLENOL)  650 MG CR tablet Take 650 mg by mouth 2 (two) times daily.    Marland Kitchen ALPRAZolam (XANAX) 0.5 MG tablet Take 0.5 mg by mouth at bedtime.    Marland Kitchen aspirin EC 81 MG tablet Take 81 mg by mouth daily.    Marland Kitchen atenolol (TENORMIN) 50 MG tablet Take 1 tablet (50 mg total) by mouth 2 (two) times daily. 180 tablet 1  . Carboxymethylcellul-Glycerin (LUBRICATING EYE DROPS OP) Place 2 drops into both eyes daily as needed (dry eyes).    . chlorthalidone (HYGROTON) 25 MG tablet Take 25 mg by mouth daily.     . diclofenac Sodium (VOLTAREN) 1 % GEL Apply 2 g topically 2 (two) times daily as needed (pain).     Marland Kitchen diltiazem (CARDIZEM) 30 MG tablet Take 30 mg by mouth 2 (two) times daily as needed (At onset of rapid heart rate).     . gabapentin (NEURONTIN) 300 MG capsule Take 300 mg by mouth every 8 (eight) hours.     Marland Kitchen glipiZIDE (GLUCOTROL XL) 5 MG 24 hr tablet Take 5 mg by mouth 2 (two) times daily.     Marland Kitchen levothyroxine (SYNTHROID, LEVOTHROID) 50 MCG tablet Take 50 mcg by mouth daily before breakfast.     . losartan (COZAAR) 50 MG tablet Take 50 mg by mouth daily.    . Multiple Vitamin (MULTIVITAMIN WITH MINERALS) TABS tablet Take 1 tablet by mouth daily.    Marland Kitchen omeprazole (PRILOSEC) 20 MG capsule Take 20 mg by mouth daily.    . potassium chloride SA (K-DUR,KLOR-CON) 20 MEQ tablet Take 2 tablets (40 mEq total) by mouth every morning. & 20 meq in the afternoon (Patient taking differently: Take 20-40 mEq by mouth See admin instructions. 40 meq in the morning and 20 meq in the bedtime) 270 tablet 3  . rivaroxaban (XARELTO) 20 MG TABS tablet Take 1 tablet (20 mg total) by mouth daily with supper. 42 tablet 0  . rosuvastatin (CRESTOR) 10 MG tablet Take 10 mg by mouth at bedtime.     No current facility-administered medications for this visit.    REVIEW OF SYSTEMS:  [X]  denotes positive finding, [ ]  denotes negative finding Cardiac  Comments:  Chest pain or chest pressure:    Shortness of breath upon exertion:    Short of breath  when lying flat:    Irregular heart rhythm:        Vascular    Pain in calf, thigh, or hip brought on by ambulation:    Pain in feet at night that wakes you up from your sleep:     Blood clot in your veins:  Leg swelling:  x       Pulmonary    Oxygen at home:    Productive cough:     Wheezing:         Neurologic    Sudden weakness in arms or legs:     Sudden numbness in arms or legs:     Sudden onset of difficulty speaking or slurred speech:    Temporary loss of vision in one eye:     Problems with dizziness:         Gastrointestinal    Blood in stool:     Vomited blood:         Genitourinary    Burning when urinating:     Blood in urine:        Psychiatric    Major depression:         Hematologic    Bleeding problems:    Problems with blood clotting too easily:        Skin    Rashes or ulcers:        Constitutional    Fever or chills:     PHYSICAL EXAM:   Vitals:   02/18/21 0835 02/18/21 0837  BP: 137/81 139/77  Pulse: (!) 59   Resp: 20   Temp: 98 F (36.7 C)   SpO2: 97%   Weight: 94.8 kg   Height: 5\' 5"  (1.651 m)     GENERAL: The patient is a well-nourished female, in no acute distress. The vital signs are documented above. CARDIAC: There is a regular rate and rhythm.  VASCULAR: I do not detect carotid bruits. I cannot palpate pedal pulses however both feet are warm and well-perfused. PULMONARY: There is good air exchange bilaterally without wheezing or rales. ABDOMEN: Soft and non-tender with normal pitched bowel sounds.  MUSCULOSKELETAL: There are no major deformities or cyanosis. NEUROLOGIC: No focal weakness or paresthesias are detected. SKIN: There are no ulcers or rashes noted. PSYCHIATRIC: The patient has a normal affect.  DATA:    CAROTID DUPLEX: I have independently interpreted her carotid duplex scan today.  On the right side, the carotid stent is widely patent.  The right vertebral artery is patent with antegrade flow.  On the  left side there is a 40 to 59% left carotid stenosis which is stable.  The left vertebral artery is patent with antegrade flow.  Deitra Mayo [facility] [phone]

## 2021-02-23 DIAGNOSIS — K219 Gastro-esophageal reflux disease without esophagitis: Secondary | ICD-10-CM | POA: Diagnosis not present

## 2021-02-23 DIAGNOSIS — E039 Hypothyroidism, unspecified: Secondary | ICD-10-CM | POA: Diagnosis not present

## 2021-02-23 DIAGNOSIS — G9009 Other idiopathic peripheral autonomic neuropathy: Secondary | ICD-10-CM | POA: Diagnosis not present

## 2021-02-23 DIAGNOSIS — E1165 Type 2 diabetes mellitus with hyperglycemia: Secondary | ICD-10-CM | POA: Diagnosis not present

## 2021-02-23 DIAGNOSIS — M170 Bilateral primary osteoarthritis of knee: Secondary | ICD-10-CM | POA: Diagnosis not present

## 2021-02-23 DIAGNOSIS — I2510 Atherosclerotic heart disease of native coronary artery without angina pectoris: Secondary | ICD-10-CM | POA: Diagnosis not present

## 2021-02-23 DIAGNOSIS — I739 Peripheral vascular disease, unspecified: Secondary | ICD-10-CM | POA: Diagnosis not present

## 2021-02-23 DIAGNOSIS — E782 Mixed hyperlipidemia: Secondary | ICD-10-CM | POA: Diagnosis not present

## 2021-02-23 DIAGNOSIS — I10 Essential (primary) hypertension: Secondary | ICD-10-CM | POA: Diagnosis not present

## 2021-02-23 DIAGNOSIS — R197 Diarrhea, unspecified: Secondary | ICD-10-CM | POA: Diagnosis not present

## 2021-02-23 DIAGNOSIS — G4700 Insomnia, unspecified: Secondary | ICD-10-CM | POA: Diagnosis not present

## 2021-02-27 DIAGNOSIS — M170 Bilateral primary osteoarthritis of knee: Secondary | ICD-10-CM | POA: Diagnosis not present

## 2021-03-23 DIAGNOSIS — M19012 Primary osteoarthritis, left shoulder: Secondary | ICD-10-CM | POA: Diagnosis not present

## 2021-03-23 DIAGNOSIS — M67814 Other specified disorders of tendon, left shoulder: Secondary | ICD-10-CM | POA: Diagnosis not present

## 2021-03-23 DIAGNOSIS — M67813 Other specified disorders of tendon, right shoulder: Secondary | ICD-10-CM | POA: Diagnosis not present

## 2021-03-23 DIAGNOSIS — M19011 Primary osteoarthritis, right shoulder: Secondary | ICD-10-CM | POA: Diagnosis not present

## 2021-03-25 DIAGNOSIS — E039 Hypothyroidism, unspecified: Secondary | ICD-10-CM | POA: Diagnosis not present

## 2021-03-25 DIAGNOSIS — I2510 Atherosclerotic heart disease of native coronary artery without angina pectoris: Secondary | ICD-10-CM | POA: Diagnosis not present

## 2021-03-25 DIAGNOSIS — M170 Bilateral primary osteoarthritis of knee: Secondary | ICD-10-CM | POA: Diagnosis not present

## 2021-03-25 DIAGNOSIS — E782 Mixed hyperlipidemia: Secondary | ICD-10-CM | POA: Diagnosis not present

## 2021-03-25 DIAGNOSIS — E1165 Type 2 diabetes mellitus with hyperglycemia: Secondary | ICD-10-CM | POA: Diagnosis not present

## 2021-03-25 DIAGNOSIS — R197 Diarrhea, unspecified: Secondary | ICD-10-CM | POA: Diagnosis not present

## 2021-03-25 DIAGNOSIS — K219 Gastro-esophageal reflux disease without esophagitis: Secondary | ICD-10-CM | POA: Diagnosis not present

## 2021-03-25 DIAGNOSIS — G4700 Insomnia, unspecified: Secondary | ICD-10-CM | POA: Diagnosis not present

## 2021-03-25 DIAGNOSIS — I739 Peripheral vascular disease, unspecified: Secondary | ICD-10-CM | POA: Diagnosis not present

## 2021-03-25 DIAGNOSIS — I10 Essential (primary) hypertension: Secondary | ICD-10-CM | POA: Diagnosis not present

## 2021-03-25 DIAGNOSIS — G9009 Other idiopathic peripheral autonomic neuropathy: Secondary | ICD-10-CM | POA: Diagnosis not present

## 2021-03-30 DIAGNOSIS — M170 Bilateral primary osteoarthritis of knee: Secondary | ICD-10-CM | POA: Diagnosis not present

## 2021-04-23 DIAGNOSIS — E119 Type 2 diabetes mellitus without complications: Secondary | ICD-10-CM | POA: Diagnosis not present

## 2021-04-23 DIAGNOSIS — Z7901 Long term (current) use of anticoagulants: Secondary | ICD-10-CM | POA: Diagnosis not present

## 2021-04-23 DIAGNOSIS — E559 Vitamin D deficiency, unspecified: Secondary | ICD-10-CM | POA: Diagnosis not present

## 2021-04-23 DIAGNOSIS — E039 Hypothyroidism, unspecified: Secondary | ICD-10-CM | POA: Diagnosis not present

## 2021-04-23 DIAGNOSIS — M130 Polyarthritis, unspecified: Secondary | ICD-10-CM | POA: Diagnosis not present

## 2021-04-23 DIAGNOSIS — I10 Essential (primary) hypertension: Secondary | ICD-10-CM | POA: Diagnosis not present

## 2021-04-26 DIAGNOSIS — K219 Gastro-esophageal reflux disease without esophagitis: Secondary | ICD-10-CM | POA: Diagnosis not present

## 2021-04-26 DIAGNOSIS — I10 Essential (primary) hypertension: Secondary | ICD-10-CM | POA: Diagnosis not present

## 2021-04-28 DIAGNOSIS — D72829 Elevated white blood cell count, unspecified: Secondary | ICD-10-CM | POA: Diagnosis not present

## 2021-04-28 DIAGNOSIS — E782 Mixed hyperlipidemia: Secondary | ICD-10-CM | POA: Diagnosis not present

## 2021-04-28 DIAGNOSIS — G4700 Insomnia, unspecified: Secondary | ICD-10-CM | POA: Diagnosis not present

## 2021-04-28 DIAGNOSIS — K219 Gastro-esophageal reflux disease without esophagitis: Secondary | ICD-10-CM | POA: Diagnosis not present

## 2021-04-28 DIAGNOSIS — I10 Essential (primary) hypertension: Secondary | ICD-10-CM | POA: Diagnosis not present

## 2021-04-28 DIAGNOSIS — I739 Peripheral vascular disease, unspecified: Secondary | ICD-10-CM | POA: Diagnosis not present

## 2021-04-28 DIAGNOSIS — E039 Hypothyroidism, unspecified: Secondary | ICD-10-CM | POA: Diagnosis not present

## 2021-04-28 DIAGNOSIS — E1165 Type 2 diabetes mellitus with hyperglycemia: Secondary | ICD-10-CM | POA: Diagnosis not present

## 2021-04-28 DIAGNOSIS — I2510 Atherosclerotic heart disease of native coronary artery without angina pectoris: Secondary | ICD-10-CM | POA: Diagnosis not present

## 2021-04-28 DIAGNOSIS — G9009 Other idiopathic peripheral autonomic neuropathy: Secondary | ICD-10-CM | POA: Diagnosis not present

## 2021-04-28 DIAGNOSIS — R197 Diarrhea, unspecified: Secondary | ICD-10-CM | POA: Diagnosis not present

## 2021-04-30 DIAGNOSIS — R079 Chest pain, unspecified: Secondary | ICD-10-CM | POA: Diagnosis not present

## 2021-04-30 DIAGNOSIS — K449 Diaphragmatic hernia without obstruction or gangrene: Secondary | ICD-10-CM | POA: Diagnosis not present

## 2021-04-30 DIAGNOSIS — I493 Ventricular premature depolarization: Secondary | ICD-10-CM | POA: Diagnosis not present

## 2021-04-30 DIAGNOSIS — I4891 Unspecified atrial fibrillation: Secondary | ICD-10-CM | POA: Diagnosis not present

## 2021-04-30 DIAGNOSIS — I517 Cardiomegaly: Secondary | ICD-10-CM | POA: Diagnosis not present

## 2021-04-30 DIAGNOSIS — I119 Hypertensive heart disease without heart failure: Secondary | ICD-10-CM | POA: Diagnosis not present

## 2021-04-30 DIAGNOSIS — R002 Palpitations: Secondary | ICD-10-CM | POA: Diagnosis not present

## 2021-04-30 DIAGNOSIS — E119 Type 2 diabetes mellitus without complications: Secondary | ICD-10-CM | POA: Diagnosis not present

## 2021-05-19 NOTE — Progress Notes (Signed)
Cardiology Office Note  Date: 05/20/2021   ID: Eman, Rynders 09-20-1946, MRN [id number]  PCP:  Celene Squibb, MD  Cardiologist:  Rozann Lesches, MD Electrophysiologist:  None   Chief Complaint  Patient presents with  . Cardiac follow-up    History of Present Illness: Martha Torres is a 75 y.o. female last seen in December 2021.  She is here with her sister for a follow-up visit.  Record review finds ER visit at [facility] in early May for evaluation of chest pain and palpitations reminiscent of previous atrial fibrillation.  High-sensitivity troponin I levels were normal and chest x-ray showed no acute findings.  Report of ECG indicated sinus rhythm with occasional PVCs.  She was discharged for outpatient follow-up.  She describes the episode to me mainly as a sense of palpitations, recorded her heart rate in the 130s at home, took a short acting diltiazem 30 mg tablet, and within an hour her symptoms resolved.  She states that she has been compliant with her remaining medications.  Blood pressure has been trending up.  She had a follow-up visit with Dr. Scot Dock in February.  I personally reviewed her ECG today which shows normal sinus rhythm.  Past Medical History:  Diagnosis Date  . Carotid artery disease ([place])    Right CEA in 2002; right carotid artery stent 2021  . Chest pain 2006   Stress nuclear-anteroapical and basilar inferior reversible defects; cath-normal coronary arteries and EF  . Chronic low back pain    MRI 2014: Spondylolisthesis of L4-L5 with foraminal narrowing, most prominent on the right and facet arthropathy.  L4-L5 surgery planned for 04/2013 with left-sided pedicle screw fixation.  . Degenerative joint disease   . Depression   . Diarrhea, functional   . DVT (deep venous thrombosis) ([place])   . Essential hypertension   . Gastroesophageal reflux    H/o stricture & hiatal hernia  . History of kidney stones   . Hyperlipidemia   . Hypothyroidism    . MRSA (methicillin resistant Staphylococcus aureus) June 2014  . Paroxysmal atrial fibrillation ([place]) 04/2013  . Type 2 diabetes mellitus ([place])     Past Surgical History:  Procedure Laterality Date  . BIOPSY  02/26/2019   Procedure: BIOPSY;  Surgeon: Danie Binder, MD;  [facility];  Service: Endoscopy;;  random colon bx's  . CAROTID ENDARTERECTOMY Right 10/25/2001   Subsequent to episode of syncope  . COLONOSCOPY N/A 02/26/2019   Procedure: COLONOSCOPY;  Surgeon: Danie Binder, MD; left colon s/p biopsy, moderate diverticulosis in the rectosigmoid, sigmoid, descending, and transverse colon, external and internal hemorrhoids, no obvious source of diarrhea identified.  Random colon biopsies were benign.    Marland Kitchen LAMINECTOMY  2000   L5-S1 discectomy and laminectomy; 3 other surgical procedures subsequently performed  . LAPAROSCOPIC CHOLECYSTECTOMY    . SPINE SURGERY  05-23-13   X's 5 surgeries  . TRANSCAROTID ARTERY REVASCULARIZATION Right 04/21/2020   Procedure: TRANSCAROTID ARTERY REVASCULARIZATION RIGHT;  Surgeon: Angelia Mould, MD;  Location: [facility];  Service: Vascular;  Laterality: Right;  Marland Kitchen VAGINAL HYSTERECTOMY      Current Outpatient Medications  Medication Sig Dispense Refill  . acetaminophen (TYLENOL) 650 MG CR tablet Take 650 mg by mouth 2 (two) times daily.    Marland Kitchen ALPRAZolam (XANAX) 0.5 MG tablet Take 0.5 mg by mouth at bedtime.    Marland Kitchen aspirin EC 81 MG tablet Take 81 mg by mouth daily.    Marland Kitchen  atenolol (TENORMIN) 50 MG tablet Take 1 tablet (50 mg total) by mouth 2 (two) times daily. 180 tablet 1  . Carboxymethylcellul-Glycerin (LUBRICATING EYE DROPS OP) Place 2 drops into both eyes daily as needed (dry eyes).    . chlorthalidone (HYGROTON) 25 MG tablet Take 25 mg by mouth daily.     . diclofenac Sodium (VOLTAREN) 1 % GEL Apply 2 g topically 2 (two) times daily as needed (pain).     Marland Kitchen diltiazem (CARDIZEM) 30 MG tablet Take 30 mg by mouth 2 (two) times daily as  needed (At onset of rapid heart rate).     . gabapentin (NEURONTIN) 300 MG capsule Take 300 mg by mouth every 8 (eight) hours.     Marland Kitchen glipiZIDE (GLUCOTROL XL) 5 MG 24 hr tablet Take 5 mg by mouth 2 (two) times daily.     Marland Kitchen levothyroxine (SYNTHROID, LEVOTHROID) 50 MCG tablet Take 50 mcg by mouth daily before breakfast.     . losartan (COZAAR) 100 MG tablet Take 1 tablet (100 mg total) by mouth daily. 90 tablet 3  . Multiple Vitamin (MULTIVITAMIN WITH MINERALS) TABS tablet Take 1 tablet by mouth daily.    Marland Kitchen omeprazole (PRILOSEC) 20 MG capsule Take 20 mg by mouth daily.    . potassium chloride SA (K-DUR,KLOR-CON) 20 MEQ tablet Take 2 tablets (40 mEq total) by mouth every morning. & 20 meq in the afternoon (Patient taking differently: Take 20-40 mEq by mouth See admin instructions. 40 meq in the morning and 20 meq in the bedtime) 270 tablet 3  . rivaroxaban (XARELTO) 20 MG TABS tablet Take 1 tablet (20 mg total) by mouth daily with supper. 42 tablet 0  . rosuvastatin (CRESTOR) 10 MG tablet Take 10 mg by mouth at bedtime.    Marland Kitchen VITAMIN D, CHOLECALCIFEROL, PO Take by mouth.     No current facility-administered medications for this visit.   Allergies:  Patient has no known allergies.   ROS: No syncope.  Physical Exam: VS:  BP (!) 170/110   Pulse 68   Ht 5\' 5"  (1.651 m)   Wt 211 lb (95.7 kg)   SpO2 97%   BMI 35.11 kg/m , BMI Body mass index is 35.11 kg/m.  Wt Readings from Last 3 Encounters:  05/20/21 211 lb (95.7 kg)  02/18/21 209 lb (94.8 kg)  12/15/20 211 lb 6.4 oz (95.9 kg)    General: Patient appears comfortable at rest. HEENT: Conjunctiva and lids normal, wearing a mask. Neck: Supple, no elevated JVP or carotid bruits, no thyromegaly. Lungs: Clear to auscultation, nonlabored breathing at rest. Cardiac: Regular rate and rhythm, no S3 or significant systolic murmur, no pericardial rub. Extremities: No pitting edema.  ECG:  An ECG dated 04/21/2020 was personally reviewed today and  demonstrated:  Normal sinus rhythm.  Recent Labwork:  May 2022: High-sensitivity troponin I of 6 and 8, hemoglobin 12.7, platelets 242, potassium 3.4, BUN 18, creatinine 0.9  Other Studies Reviewed Today:  Carotid Dopplers 02/18/2021: Patent RICA stent, 40 to 32% LICA stenosis.  Chest x-ray 04/30/2021: FINDINGS:  Cardiomegaly. Double density over the left heart. There is history  of a hiatal hernia by 2016 chest CT report. Low volume chest which  accentuates interstitial markings. There is no edema, consolidation,  effusion, or pneumothorax.   IMPRESSION:  1. No acute finding.  2. Cardiomegaly and low lungvolumes.  Assessment and Plan:  1.  Paroxysmal atrial fibrillation with CHA2DS2-VASc score 5.  Recent breakthrough episode noted, she is in sinus  rhythm today and has not required consistent use of short acting Cardizem.  Would continue current AV nodal blocker regimen, hold off on uptitrating atenolol given resting heart rate in the low 60s in sinus rhythm.  Continue Xarelto for stroke prophylaxis.  2.  Acquired thrombophilia, on Xarelto for stroke prophylaxis.  I reviewed her recent lab work.  She denies any spontaneous bleeding problems.  3.  Essential hypertension, blood pressure trending up.  Increase Cozaar to 50 mg twice daily and otherwise continue current doses of chlorthalidone and atenolol.  We will arrange nurse blood pressure check in the next few weeks in [place].  Also check blood pressure with home cuff.  4.  Carotid artery disease followed by Dr. Scot Dock.  Recent carotid Dopplers showed patent RICA stent site with moderate LICA stenosis. She remains on Crestor.  Medication Adjustments/Labs and Tests Ordered: Current medicines are reviewed at length with the patient today.  Concerns regarding medicines are outlined above.   Tests Ordered: Orders Placed This Encounter  Procedures  . EKG 12-Lead    Medication Changes: Meds ordered this encounter  Medications  .  losartan (COZAAR) 100 MG tablet    Sig: Take 1 tablet (100 mg total) by mouth daily.    Dispense:  90 tablet    Refill:  3    Disposition:  Follow up 2 weeks nurse blood pressure check in [place] in 75-month follow-up thereafter.  Signed, Satira Sark, MD, [facility] 05/20/2021 8:50 AM    [facility] [place]. [address] [place], [place] [address] Phone: [phone]; Fax: [phone]

## 2021-05-20 ENCOUNTER — Ambulatory Visit: Payer: Medicare Other | Admitting: Cardiology

## 2021-05-20 ENCOUNTER — Other Ambulatory Visit: Payer: Self-pay

## 2021-05-20 ENCOUNTER — Encounter: Payer: Self-pay | Admitting: Cardiology

## 2021-05-20 VITALS — BP 170/110 | HR 68 | Ht 65.0 in | Wt 211.0 lb

## 2021-05-20 DIAGNOSIS — I480 Paroxysmal atrial fibrillation: Secondary | ICD-10-CM

## 2021-05-20 DIAGNOSIS — I10 Essential (primary) hypertension: Secondary | ICD-10-CM | POA: Diagnosis not present

## 2021-05-20 DIAGNOSIS — I6523 Occlusion and stenosis of bilateral carotid arteries: Secondary | ICD-10-CM

## 2021-05-20 MED ORDER — LOSARTAN POTASSIUM 100 MG PO TABS: 100.0000 mg | ORAL_TABLET | Freq: Every day | ORAL | 3 refills | Status: DC

## 2021-05-20 NOTE — Patient Instructions (Signed)
Medication Instructions:  Your physician has recommended you make the following change in your medication:  INCREASE Losartan (Cozaar) to 100 mg daily  *If you need a refill on your cardiac medications before your next appointment, please call your pharmacy*   Lab Work: None If you have labs (blood work) drawn today and your tests are completely normal, you will receive your results only by: Marland Kitchen MyChart Message (if you have MyChart) OR . A paper copy in the mail If you have any lab test that is abnormal or we need to change your treatment, we will call you to review the results.   Testing/Procedures: None   Follow-Up: At [facility], you and your health needs are our priority.  As part of our continuing mission to provide you with exceptional heart care, we have created designated Provider Care Teams.  These Care Teams include your primary Cardiologist (physician) and Advanced Practice Providers (APPs -  Physician Assistants and Nurse Practitioners) who all work together to provide you with the care you need, when you need it.  We recommend signing up for the patient portal called "MyChart".  Sign up information is provided on this After Visit Summary.  MyChart is used to connect with patients for Virtual Visits (Telemedicine).  Patients are able to view lab/test results, encounter notes, upcoming appointments, etc.  Non-urgent messages can be sent to your provider as well.   To learn more about what you can do with MyChart, go to [email or web address].    Your next appointment:   6 month(s)  The format for your next appointment:   In Person  Provider:   Rozann Lesches, MD   Other Instructions Please keep BP log and bring it with you to your Nurse Visit in [place] in 2 weeks.

## 2021-05-27 DIAGNOSIS — M170 Bilateral primary osteoarthritis of knee: Secondary | ICD-10-CM | POA: Diagnosis not present

## 2021-06-08 ENCOUNTER — Ambulatory Visit (INDEPENDENT_AMBULATORY_CARE_PROVIDER_SITE_OTHER): Payer: Medicare Other | Admitting: *Deleted

## 2021-06-08 ENCOUNTER — Encounter: Payer: Self-pay | Admitting: *Deleted

## 2021-06-08 VITALS — BP 126/60 | HR 68 | Ht 65.0 in | Wt 212.2 lb

## 2021-06-08 DIAGNOSIS — I10 Essential (primary) hypertension: Secondary | ICD-10-CM

## 2021-06-08 MED ORDER — DILTIAZEM HCL 30 MG PO TABS: 30.0000 mg | ORAL_TABLET | Freq: Two times a day (BID) | ORAL | 3 refills | Status: DC | PRN

## 2021-06-08 NOTE — Progress Notes (Signed)
Reason for visit: blood pressure check  Name of MD requesting visit: Rozann Lesches MD  H&P: losartan increased to 100 mg daily at last visit due to elevated BP. Brought home BP readings in office for review  ROS related to problem: Denies dizziness, chest pain, sob. Has taken all medications as prescribed without missing doses.  Assessment and plan per MD: medications reviewed along with home BP readings, vitals taken. visit sent to provider for review as well has home BP readings placed in basket for review.

## 2021-06-08 NOTE — Patient Instructions (Signed)
Continue same medications and same follow up

## 2021-06-22 DIAGNOSIS — L539 Erythematous condition, unspecified: Secondary | ICD-10-CM | POA: Diagnosis not present

## 2021-06-25 DIAGNOSIS — E1165 Type 2 diabetes mellitus with hyperglycemia: Secondary | ICD-10-CM | POA: Diagnosis not present

## 2021-06-25 DIAGNOSIS — I10 Essential (primary) hypertension: Secondary | ICD-10-CM | POA: Diagnosis not present

## 2021-06-26 DIAGNOSIS — L539 Erythematous condition, unspecified: Secondary | ICD-10-CM | POA: Diagnosis not present

## 2021-07-06 ENCOUNTER — Ambulatory Visit: Payer: Medicare Other | Admitting: Cardiology

## 2021-07-14 DIAGNOSIS — I10 Essential (primary) hypertension: Secondary | ICD-10-CM | POA: Diagnosis not present

## 2021-07-14 DIAGNOSIS — B359 Dermatophytosis, unspecified: Secondary | ICD-10-CM | POA: Diagnosis not present

## 2021-07-22 DIAGNOSIS — E782 Mixed hyperlipidemia: Secondary | ICD-10-CM | POA: Diagnosis not present

## 2021-07-22 DIAGNOSIS — E039 Hypothyroidism, unspecified: Secondary | ICD-10-CM | POA: Diagnosis not present

## 2021-07-22 DIAGNOSIS — E119 Type 2 diabetes mellitus without complications: Secondary | ICD-10-CM | POA: Diagnosis not present

## 2021-07-29 ENCOUNTER — Other Ambulatory Visit (HOSPITAL_COMMUNITY): Payer: Self-pay | Admitting: Internal Medicine

## 2021-07-29 ENCOUNTER — Other Ambulatory Visit: Payer: Self-pay | Admitting: Internal Medicine

## 2021-07-29 DIAGNOSIS — I480 Paroxysmal atrial fibrillation: Secondary | ICD-10-CM | POA: Diagnosis not present

## 2021-07-29 DIAGNOSIS — M1990 Unspecified osteoarthritis, unspecified site: Secondary | ICD-10-CM | POA: Diagnosis not present

## 2021-07-29 DIAGNOSIS — J302 Other seasonal allergic rhinitis: Secondary | ICD-10-CM | POA: Diagnosis not present

## 2021-07-29 DIAGNOSIS — E1169 Type 2 diabetes mellitus with other specified complication: Secondary | ICD-10-CM | POA: Diagnosis not present

## 2021-07-29 DIAGNOSIS — L539 Erythematous condition, unspecified: Secondary | ICD-10-CM

## 2021-07-29 DIAGNOSIS — I10 Essential (primary) hypertension: Secondary | ICD-10-CM | POA: Diagnosis not present

## 2021-07-29 DIAGNOSIS — D490 Neoplasm of unspecified behavior of digestive system: Secondary | ICD-10-CM | POA: Diagnosis not present

## 2021-07-29 DIAGNOSIS — K219 Gastro-esophageal reflux disease without esophagitis: Secondary | ICD-10-CM | POA: Diagnosis not present

## 2021-07-29 DIAGNOSIS — E039 Hypothyroidism, unspecified: Secondary | ICD-10-CM | POA: Diagnosis not present

## 2021-07-29 DIAGNOSIS — K625 Hemorrhage of anus and rectum: Secondary | ICD-10-CM | POA: Diagnosis not present

## 2021-07-29 DIAGNOSIS — E782 Mixed hyperlipidemia: Secondary | ICD-10-CM | POA: Diagnosis not present

## 2021-07-29 DIAGNOSIS — I6521 Occlusion and stenosis of right carotid artery: Secondary | ICD-10-CM | POA: Diagnosis not present

## 2021-08-06 DIAGNOSIS — S83271A Complex tear of lateral meniscus, current injury, right knee, initial encounter: Secondary | ICD-10-CM | POA: Diagnosis not present

## 2021-08-06 DIAGNOSIS — G8929 Other chronic pain: Secondary | ICD-10-CM | POA: Diagnosis not present

## 2021-08-06 DIAGNOSIS — M660 Rupture of popliteal cyst: Secondary | ICD-10-CM | POA: Diagnosis not present

## 2021-08-06 DIAGNOSIS — M25561 Pain in right knee: Secondary | ICD-10-CM | POA: Diagnosis not present

## 2021-08-06 DIAGNOSIS — M25461 Effusion, right knee: Secondary | ICD-10-CM | POA: Diagnosis not present

## 2021-08-18 DIAGNOSIS — M170 Bilateral primary osteoarthritis of knee: Secondary | ICD-10-CM | POA: Diagnosis not present

## 2021-08-20 ENCOUNTER — Telehealth: Payer: Self-pay

## 2021-08-20 MED ORDER — DILTIAZEM HCL 30 MG PO TABS: 30.0000 mg | ORAL_TABLET | Freq: Two times a day (BID) | ORAL | 3 refills | Status: DC | PRN

## 2021-08-20 NOTE — Telephone Encounter (Signed)
Medication refill request approved for Diltiazem 30 mg tablets.

## 2021-08-26 ENCOUNTER — Ambulatory Visit (HOSPITAL_COMMUNITY): Payer: Medicare Other | Attending: Internal Medicine

## 2021-08-26 ENCOUNTER — Encounter (HOSPITAL_COMMUNITY): Payer: Self-pay

## 2021-08-26 DIAGNOSIS — E1165 Type 2 diabetes mellitus with hyperglycemia: Secondary | ICD-10-CM | POA: Diagnosis not present

## 2021-08-26 DIAGNOSIS — I10 Essential (primary) hypertension: Secondary | ICD-10-CM | POA: Diagnosis not present

## 2021-08-27 DIAGNOSIS — M19012 Primary osteoarthritis, left shoulder: Secondary | ICD-10-CM | POA: Diagnosis not present

## 2021-08-27 DIAGNOSIS — M1711 Unilateral primary osteoarthritis, right knee: Secondary | ICD-10-CM | POA: Diagnosis not present

## 2021-08-27 DIAGNOSIS — M19011 Primary osteoarthritis, right shoulder: Secondary | ICD-10-CM | POA: Diagnosis not present

## 2021-09-10 ENCOUNTER — Other Ambulatory Visit: Payer: Self-pay

## 2021-09-10 ENCOUNTER — Ambulatory Visit (HOSPITAL_COMMUNITY)
Admission: RE | Admit: 2021-09-10 | Discharge: 2021-09-10 | Disposition: A | Discharge status: Home / Self Care | Payer: Medicare Other | Source: Ambulatory Visit | Attending: Internal Medicine | Admitting: Internal Medicine

## 2021-09-10 DIAGNOSIS — M7989 Other specified soft tissue disorders: Secondary | ICD-10-CM | POA: Diagnosis not present

## 2021-09-10 DIAGNOSIS — L539 Erythematous condition, unspecified: Secondary | ICD-10-CM | POA: Diagnosis not present

## 2021-09-17 ENCOUNTER — Telehealth: Payer: Self-pay | Admitting: Cardiology

## 2021-09-17 NOTE — Telephone Encounter (Signed)
Reviewed with pt PAF requirements. Pt to fill out PAF and submit it.   Xarelto Sample: Lot #: [id number] EXP: 03/2023

## 2021-09-17 NOTE — Telephone Encounter (Signed)
Pt called stating she is in the doughnut hole w/ her medication- she's needing some samples of rivaroxaban (XARELTO) 20 MG TABS tablet [[id number]]

## 2021-09-17 NOTE — Telephone Encounter (Signed)
Pt requesting Xarelto 20mg  tablet samples.  Indication: PAF Last office visit: 05/20/21  Myles Gip Weight: 95.7kg Age: 75 Scr: 0.90 on 04/30/21 CrCl: 84.11  Based on above findings Xarelto 20mg  daily is the appropriate dose.  OK to supply samples if they are available.

## 2021-09-25 DIAGNOSIS — I10 Essential (primary) hypertension: Secondary | ICD-10-CM | POA: Diagnosis not present

## 2021-09-25 DIAGNOSIS — E1165 Type 2 diabetes mellitus with hyperglycemia: Secondary | ICD-10-CM | POA: Diagnosis not present

## 2021-10-20 DIAGNOSIS — M1711 Unilateral primary osteoarthritis, right knee: Secondary | ICD-10-CM | POA: Diagnosis not present

## 2021-10-20 DIAGNOSIS — M25561 Pain in right knee: Secondary | ICD-10-CM | POA: Diagnosis not present

## 2021-10-20 DIAGNOSIS — S8991XA Unspecified injury of right lower leg, initial encounter: Secondary | ICD-10-CM | POA: Diagnosis not present

## 2021-10-22 ENCOUNTER — Other Ambulatory Visit: Payer: Self-pay | Admitting: Cardiology

## 2021-10-25 ENCOUNTER — Observation Stay (HOSPITAL_COMMUNITY)
Admission: EM | Admit: 2021-10-25 | Discharge: 2021-10-26 | Disposition: A | Discharge status: Home / Self Care | Payer: Medicare Other | Attending: Family Medicine | Admitting: Family Medicine

## 2021-10-25 ENCOUNTER — Encounter (HOSPITAL_COMMUNITY): Payer: Self-pay

## 2021-10-25 ENCOUNTER — Emergency Department (HOSPITAL_COMMUNITY): Payer: Medicare Other

## 2021-10-25 DIAGNOSIS — Z87891 Personal history of nicotine dependence: Secondary | ICD-10-CM | POA: Insufficient documentation

## 2021-10-25 DIAGNOSIS — J9811 Atelectasis: Secondary | ICD-10-CM | POA: Diagnosis not present

## 2021-10-25 DIAGNOSIS — I6521 Occlusion and stenosis of right carotid artery: Secondary | ICD-10-CM | POA: Diagnosis not present

## 2021-10-25 DIAGNOSIS — I10 Essential (primary) hypertension: Secondary | ICD-10-CM

## 2021-10-25 DIAGNOSIS — R0602 Shortness of breath: Secondary | ICD-10-CM | POA: Diagnosis not present

## 2021-10-25 DIAGNOSIS — I6529 Occlusion and stenosis of unspecified carotid artery: Secondary | ICD-10-CM | POA: Diagnosis present

## 2021-10-25 DIAGNOSIS — I4891 Unspecified atrial fibrillation: Secondary | ICD-10-CM | POA: Diagnosis not present

## 2021-10-25 DIAGNOSIS — Z20822 Contact with and (suspected) exposure to covid-19: Secondary | ICD-10-CM | POA: Diagnosis not present

## 2021-10-25 DIAGNOSIS — Z79899 Other long term (current) drug therapy: Secondary | ICD-10-CM | POA: Insufficient documentation

## 2021-10-25 DIAGNOSIS — I517 Cardiomegaly: Secondary | ICD-10-CM | POA: Diagnosis not present

## 2021-10-25 DIAGNOSIS — Z7982 Long term (current) use of aspirin: Secondary | ICD-10-CM | POA: Diagnosis not present

## 2021-10-25 DIAGNOSIS — R5383 Other fatigue: Secondary | ICD-10-CM | POA: Diagnosis not present

## 2021-10-25 DIAGNOSIS — K449 Diaphragmatic hernia without obstruction or gangrene: Secondary | ICD-10-CM | POA: Diagnosis not present

## 2021-10-25 LAB — COMPREHENSIVE METABOLIC PANEL
ALT: 20 U/L (ref 0–44)
AST: 14 U/L — ABNORMAL LOW (ref 15–41)
Albumin: 3.8 g/dL (ref 3.5–5.0)
Alkaline Phosphatase: 78 U/L (ref 38–126)
Anion gap: 10 (ref 5–15)
BUN: 23 mg/dL (ref 8–23)
CO2: 27 mmol/L (ref 22–32)
Calcium: 9.2 mg/dL (ref 8.9–10.3)
Chloride: 97 mmol/L — ABNORMAL LOW (ref 98–111)
Creatinine, Ser: 1.08 mg/dL — ABNORMAL HIGH (ref 0.44–1.00)
GFR, Estimated: 54 mL/min — ABNORMAL LOW (ref >60)
Glucose, Bld: 151 mg/dL — ABNORMAL HIGH (ref 70–99)
Potassium: 3.8 mmol/L (ref 3.5–5.1)
Sodium: 134 mmol/L — ABNORMAL LOW (ref 135–145)
Total Bilirubin: 0.6 mg/dL (ref 0.3–1.2)
Total Protein: 6.8 g/dL (ref 6.5–8.1)

## 2021-10-25 LAB — CBC WITH DIFFERENTIAL/PLATELET
Abs Immature Granulocytes: 0.09 10*3/uL — ABNORMAL HIGH (ref 0.00–0.07)
Basophils Absolute: 0.1 10*3/uL (ref 0.0–0.1)
Basophils Relative: 1 %
Eosinophils Absolute: 0.1 10*3/uL (ref 0.0–0.5)
Eosinophils Relative: 0 %
HCT: 48.5 % — ABNORMAL HIGH (ref 36.0–46.0)
Hemoglobin: 15.4 g/dL — ABNORMAL HIGH (ref 12.0–15.0)
Immature Granulocytes: 1 %
Lymphocytes Relative: 29 %
Lymphs Abs: 4.3 10*3/uL — ABNORMAL HIGH (ref 0.7–4.0)
MCH: 28.7 pg (ref 26.0–34.0)
MCHC: 31.8 g/dL (ref 30.0–36.0)
MCV: 90.3 fL (ref 80.0–100.0)
Monocytes Absolute: 1.3 10*3/uL — ABNORMAL HIGH (ref 0.1–1.0)
Monocytes Relative: 9 %
Neutro Abs: 9 10*3/uL — ABNORMAL HIGH (ref 1.7–7.7)
Neutrophils Relative %: 60 %
Platelets: 309 10*3/uL (ref 150–400)
RBC: 5.37 MIL/uL — ABNORMAL HIGH (ref 3.87–5.11)
RDW: 16.5 % — ABNORMAL HIGH (ref 11.5–15.5)
WBC: 14.7 10*3/uL — ABNORMAL HIGH (ref 4.0–10.5)
nRBC: 0 % (ref 0.0–0.2)

## 2021-10-25 LAB — RESP PANEL BY RT-PCR (FLU A&B, COVID) ARPGX2
Influenza A by PCR: NEGATIVE
Influenza B by PCR: NEGATIVE
SARS Coronavirus 2 by RT PCR: NEGATIVE

## 2021-10-25 LAB — MAGNESIUM: Magnesium: 1.8 mg/dL (ref 1.7–2.4)

## 2021-10-25 LAB — TSH: TSH: 1.343 u[IU]/mL (ref 0.350–4.500)

## 2021-10-25 LAB — TROPONIN I (HIGH SENSITIVITY)
Troponin I (High Sensitivity): 10 ng/L (ref <18)
Troponin I (High Sensitivity): 10 ng/L (ref <18)

## 2021-10-25 MED ORDER — ACETAMINOPHEN 650 MG RE SUPP: 650.0000 mg | Freq: Four times a day (QID) | RECTAL | Status: DC | PRN

## 2021-10-25 MED ORDER — DILTIAZEM HCL 25 MG/5ML IV SOLN
10.0000 mg | Freq: Once | INTRAVENOUS | Status: DC
  Filled 2021-10-25: qty 5

## 2021-10-25 MED ORDER — DILTIAZEM HCL 30 MG PO TABS
30.0000 mg | ORAL_TABLET | Freq: Once | ORAL | Status: AC
  Administered 2021-10-25: 30 mg via ORAL
  Filled 2021-10-25: qty 1

## 2021-10-25 MED ORDER — DILTIAZEM HCL-DEXTROSE 125-5 MG/125ML-% IV SOLN (PREMIX)
5.0000 mg/h | INTRAVENOUS | Status: DC
  Administered 2021-10-25: 5 mg/h via INTRAVENOUS
  Filled 2021-10-25: qty 125

## 2021-10-25 MED ORDER — ONDANSETRON HCL 4 MG PO TABS: 4.0000 mg | ORAL_TABLET | Freq: Four times a day (QID) | ORAL | Status: DC | PRN

## 2021-10-25 MED ORDER — GLIPIZIDE ER 5 MG PO TB24
5.0000 mg | ORAL_TABLET | Freq: Two times a day (BID) | ORAL | Status: DC
  Administered 2021-10-25: 5 mg via ORAL
  Filled 2021-10-25: qty 1

## 2021-10-25 MED ORDER — DILTIAZEM HCL 25 MG/5ML IV SOLN
INTRAVENOUS | Status: AC
  Filled 2021-10-25: qty 5

## 2021-10-25 MED ORDER — ACETAMINOPHEN 325 MG PO TABS: 650.0000 mg | ORAL_TABLET | Freq: Four times a day (QID) | ORAL | Status: DC | PRN

## 2021-10-25 MED ORDER — INSULIN ASPART 100 UNIT/ML IJ SOLN: 0.0000 [IU] | Freq: Three times a day (TID) | INTRAMUSCULAR | Status: DC

## 2021-10-25 MED ORDER — ASPIRIN EC 81 MG PO TBEC
81.0000 mg | DELAYED_RELEASE_TABLET | Freq: Every day | ORAL | Status: DC
  Administered 2021-10-26: 81 mg via ORAL
  Filled 2021-10-25: qty 1

## 2021-10-25 MED ORDER — INSULIN ASPART 100 UNIT/ML IJ SOLN: 0.0000 [IU] | Freq: Every day | INTRAMUSCULAR | Status: DC

## 2021-10-25 MED ORDER — LEVOTHYROXINE SODIUM 50 MCG PO TABS
50.0000 ug | ORAL_TABLET | Freq: Every day | ORAL | Status: DC
  Administered 2021-10-26: 50 ug via ORAL
  Filled 2021-10-25: qty 1

## 2021-10-25 MED ORDER — LACTATED RINGERS IV BOLUS
1000.0000 mL | Freq: Once | INTRAVENOUS | Status: AC
  Administered 2021-10-25: 1000 mL via INTRAVENOUS

## 2021-10-25 MED ORDER — ATENOLOL 25 MG PO TABS
50.0000 mg | ORAL_TABLET | Freq: Two times a day (BID) | ORAL | Status: DC
  Administered 2021-10-25 – 2021-10-26 (×2): 50 mg via ORAL
  Filled 2021-10-25 (×2): qty 2

## 2021-10-25 MED ORDER — DILTIAZEM HCL 25 MG/5ML IV SOLN
10.0000 mg | Freq: Once | INTRAVENOUS | Status: AC
  Administered 2021-10-25: 10 mg via INTRAVENOUS

## 2021-10-25 MED ORDER — POLYETHYLENE GLYCOL 3350 17 G PO PACK: 17.0000 g | PACK | Freq: Every day | ORAL | Status: DC | PRN

## 2021-10-25 MED ORDER — POTASSIUM CHLORIDE CRYS ER 20 MEQ PO TBCR: 20.0000 meq | EXTENDED_RELEASE_TABLET | ORAL | Status: DC

## 2021-10-25 MED ORDER — RIVAROXABAN 20 MG PO TABS
20.0000 mg | ORAL_TABLET | Freq: Every day | ORAL | Status: DC
  Administered 2021-10-25: 20 mg via ORAL
  Filled 2021-10-25: qty 1

## 2021-10-25 MED ORDER — ROSUVASTATIN CALCIUM 10 MG PO TABS
10.0000 mg | ORAL_TABLET | Freq: Every day | ORAL | Status: DC
  Administered 2021-10-25: 10 mg via ORAL
  Filled 2021-10-25: qty 1

## 2021-10-25 MED ORDER — GABAPENTIN 300 MG PO CAPS
300.0000 mg | ORAL_CAPSULE | Freq: Three times a day (TID) | ORAL | Status: DC
  Administered 2021-10-25 – 2021-10-26 (×3): 300 mg via ORAL
  Filled 2021-10-25 (×3): qty 1

## 2021-10-25 MED ORDER — ALPRAZOLAM 0.5 MG PO TABS
0.5000 mg | ORAL_TABLET | Freq: Every day | ORAL | Status: DC
  Administered 2021-10-25: 0.5 mg via ORAL
  Filled 2021-10-25: qty 1

## 2021-10-25 MED ORDER — PANTOPRAZOLE SODIUM 40 MG PO TBEC
40.0000 mg | DELAYED_RELEASE_TABLET | Freq: Every day | ORAL | Status: DC
  Administered 2021-10-26: 40 mg via ORAL
  Filled 2021-10-25: qty 1

## 2021-10-25 MED ORDER — ONDANSETRON HCL 4 MG/2ML IJ SOLN: 4.0000 mg | Freq: Four times a day (QID) | INTRAMUSCULAR | Status: DC | PRN

## 2021-10-25 NOTE — ED Provider Notes (Signed)
[facility] EMERGENCY DEPARTMENT Provider Note   CSN: [id number] Arrival date & time: 10/25/21  1338     History Chief Complaint  Patient presents with   Fatigue    Martha Torres is a 75 y.o. female with PMH DVT on Eliquis, paroxysmal A. fib, nephrolithiasis, HLD, T2DM who presents the emergency department for evaluation of fatigue.  Patient states that she "feels drained" but denies any chest pain, shortness of breath, Donnell pain, nausea, vomiting, fever or other systemic symptoms.  She states that she feels that she may have been in and out of A. fib 3 times this week but has been compliant with her anticoagulation with her last dose last night.  She arrives with an irregular tachycardia in the 140s.  HPI     Past Medical History:  Diagnosis Date   Carotid artery disease ([place])    Right CEA in 2002; right carotid artery stent 2021   Chest pain 2006   Stress nuclear-anteroapical and basilar inferior reversible defects; cath-normal coronary arteries and EF   Chronic low back pain    MRI 2014: Spondylolisthesis of L4-L5 with foraminal narrowing, most prominent on the right and facet arthropathy.  L4-L5 surgery planned for 04/2013 with left-sided pedicle screw fixation.   Degenerative joint disease    Depression    Diarrhea, functional    DVT (deep venous thrombosis) ([facility])    Essential hypertension    Gastroesophageal reflux    H/o stricture & hiatal hernia   History of kidney stones    Hyperlipidemia    Hypothyroidism    MRSA (methicillin resistant Staphylococcus aureus) June 2014   Paroxysmal atrial fibrillation ([place]) 04/2013   Type 2 diabetes mellitus ([place])     Patient Active Problem List   Diagnosis Date Noted   Rectal bleeding 05/19/2020   Carotid artery stenosis 04/21/2020   Diarrhea 12/05/2018   Abdominal pain 12/05/2018   Pain in joint, lower leg 11/06/2014   Occlusion and stenosis of carotid artery without mention of cerebral infarction 08/16/2013    Aftercare following surgery of the circulatory system, NEC 08/16/2013   Chronic low back pain    Hypertension    Hyperlipidemia    Chest pain    Tobacco abuse    Cerebrovascular disease    DVT (deep venous thrombosis) ([place]) 05/08/2013   Gastroesophageal reflux disease 05/08/2013    Past Surgical History:  Procedure Laterality Date   BIOPSY  02/26/2019   Procedure: BIOPSY;  Surgeon: Danie Binder, MD;  [facility];  Service: Endoscopy;;  random colon bx's   CAROTID ENDARTERECTOMY Right 10/25/2001   Subsequent to episode of syncope   COLONOSCOPY N/A 02/26/2019   Procedure: COLONOSCOPY;  Surgeon: Danie Binder, MD; left colon s/p biopsy, moderate diverticulosis in the rectosigmoid, sigmoid, descending, and transverse colon, external and internal hemorrhoids, no obvious source of diarrhea identified.  Random colon biopsies were benign.     LAMINECTOMY  2000   L5-S1 discectomy and laminectomy; 3 other surgical procedures subsequently performed   [place]  05-23-13   X's 5 surgeries   TRANSCAROTID ARTERY REVASCULARIZATION  Right 04/21/2020   Procedure: TRANSCAROTID ARTERY REVASCULARIZATION RIGHT;  Surgeon: Angelia Mould, MD;  Location: [place];  Service: Vascular;  Laterality: Right;   VAGINAL HYSTERECTOMY       OB History   No obstetric history on file.     Family History  Problem Relation Age of Onset   Heart  attack Father        Also mother   Cancer - Prostate Father    Heart disease Father        Heart Disease before age 30   Hyperlipidemia Father    Hypertension Sister    Hyperlipidemia Sister    Cancer Sister    Stroke Mother    Heart disease Mother        Heart Disease before age 65   Hyperlipidemia Mother    Heart attack Mother    Diabetes Sister    Hyperlipidemia Sister    Hypertension Sister    Hyperlipidemia Brother    Cancer Brother        Luekemia   Hypertension Brother    Colon cancer Neg Hx      Social History   Tobacco Use   Smoking status: Former    Packs/day: 0.25    Years: 43.00    Pack years: 10.75    Types: Cigarettes    Start date: 03/10/1965    Quit date: 11/06/2008    Years since quitting: 12.9   Smokeless tobacco: Never  Vaping Use   Vaping Use: Never used  Substance Use Topics   Alcohol use: Never    Alcohol/week: 1.0 standard drink    Types: 1 Standard drinks or equivalent per week   Drug use: Never    Home Medications Prior to Admission medications   Medication Sig Start Date End Date Taking? Authorizing Provider  acetaminophen (TYLENOL) 650 MG CR tablet Take 650 mg by mouth daily as needed.   Yes [provider]  ALPRAZolam Duanne Moron) 0.5 MG tablet Take 0.5 mg by mouth at bedtime.   Yes [provider]  aspirin EC 81 MG tablet Take 81 mg by mouth daily.   Yes [provider]  atenolol (TENORMIN) 50 MG tablet Take 1 tablet (50 mg total) by mouth 2 (two) times daily. 09/18/18  Yes Satira Sark, MD  chlorthalidone (HYGROTON) 25 MG tablet Take 25 mg by mouth daily.  05/07/13  Yes [provider]  diltiazem (CARDIZEM) 30 MG tablet TAKE 1 TABLET BY MOUTH  TWICE DAILY AS NEEDED AT  ONSET OF RAPID HEART RATE 10/22/21  Yes Satira Sark, MD  gabapentin (NEURONTIN) 300 MG capsule Take 300 mg by mouth every 8 (eight) hours.    Yes [provider]  glipiZIDE (GLUCOTROL XL) 5 MG 24 hr tablet Take 5 mg by mouth 2 (two) times daily.    Yes [provider]  levothyroxine (SYNTHROID, LEVOTHROID) 50 MCG tablet Take 50 mcg by mouth daily before breakfast.    Yes [provider]  losartan (COZAAR) 100 MG tablet Take 1 tablet (100 mg total) by mouth daily. 05/20/21 10/25/21 Yes Satira Sark, MD  MAGNESIUM PO Take 1 tablet by mouth daily.   Yes [provider]  Multiple Vitamin (MULTIVITAMIN WITH MINERALS) TABS tablet Take 1 tablet by mouth daily.   Yes [provider]  omeprazole  (PRILOSEC) 20 MG capsule Take 20 mg by mouth daily.   Yes [provider]  potassium chloride SA (K-DUR,KLOR-CON) 20 MEQ tablet Take 2 tablets (40 mEq total) by mouth every morning. & 20 meq in the afternoon Patient taking differently: Take 20-40 mEq by mouth See admin instructions. 40 meq in the morning and 20 meq in the bedtime 02/27/19  Yes Satira Sark, MD  rivaroxaban (XARELTO) 20 MG TABS tablet Take 1 tablet (20 mg total) by mouth daily with  supper. 12/15/20  Yes Satira Sark, MD  rosuvastatin (CRESTOR) 10 MG tablet Take 10 mg by mouth at bedtime. 03/06/20  Yes [provider]  VITAMIN D, CHOLECALCIFEROL, PO Take 1 capsule by mouth daily.   Yes [provider]  acetaminophen (TYLENOL) 650 MG CR tablet Take 650 mg by mouth 2 (two) times daily. Patient not taking: Reported on 10/25/2021    [provider]  Carboxymethylcellul-Glycerin (LUBRICATING EYE DROPS OP) Place 2 drops into both eyes daily as needed (dry eyes). Patient not taking: Reported on 10/25/2021    [provider]  diclofenac Sodium (VOLTAREN) 1 % GEL Apply 2 g topically 2 (two) times daily as needed (pain).  Patient not taking: Reported on 10/25/2021    [provider]    Allergies    Patient has no known allergies.  Review of Systems   Review of Systems  Constitutional:  Positive for fatigue. Negative for chills and fever.  HENT:  Negative for ear pain and sore throat.   Eyes:  Negative for pain and visual disturbance.  Respiratory:  Negative for cough and shortness of breath.   Cardiovascular:  Positive for palpitations. Negative for chest pain.  Gastrointestinal:  Negative for abdominal pain and vomiting.  Genitourinary:  Negative for dysuria and hematuria.  Musculoskeletal:  Negative for arthralgias and back pain.  Skin:  Negative for color change and rash.  Neurological:  Negative for seizures and syncope.  All other systems reviewed and are  negative.  Physical Exam Updated Vital Signs BP 120/74   Pulse 76   Temp 97.9 F (36.6 C) (Oral)   Resp 17   Ht 5\' 5"  (1.651 m)   Wt 94.5 kg   SpO2 99%   BMI 34.66 kg/m   Physical Exam Vitals and nursing note reviewed.  Constitutional:      General: She is not in acute distress.    Appearance: She is well-developed.  HENT:     Head: Normocephalic and atraumatic.  Eyes:     Conjunctiva/sclera: Conjunctivae normal.  Cardiovascular:     Rate and Rhythm: Tachycardia present. Rhythm irregular.     Heart sounds: No murmur heard. Pulmonary:     Effort: Pulmonary effort is normal. No respiratory distress.     Breath sounds: Normal breath sounds.  Abdominal:     Palpations: Abdomen is soft.     Tenderness: There is no abdominal tenderness.  Musculoskeletal:     Cervical back: Neck supple.  Skin:    General: Skin is warm and dry.  Neurological:     Mental Status: She is alert.    ED Results / Procedures / Treatments   Labs (all labs ordered are listed, but only abnormal results are displayed) Labs Reviewed  COMPREHENSIVE METABOLIC PANEL - Abnormal; Notable for the following components:      Result Value   Sodium 134 (*)    Chloride 97 (*)    Glucose, Bld 151 (*)    Creatinine, Ser 1.08 (*)    AST 14 (*)    GFR, Estimated 54 (*)    All other components within normal limits  CBC WITH DIFFERENTIAL/PLATELET - Abnormal; Notable for the following components:   WBC 14.7 (*)    RBC 5.37 (*)    Hemoglobin 15.4 (*)    HCT 48.5 (*)    RDW 16.5 (*)    Neutro Abs 9.0 (*)    Lymphs Abs 4.3 (*)    Monocytes Absolute 1.3 (*)  Abs Immature Granulocytes 0.09 (*)    All other components within normal limits  TROPONIN I (HIGH SENSITIVITY)    EKG None  Radiology DG Chest Port 1 View  Result Date: 10/25/2021 CLINICAL DATA:  Shortness of breath. EXAM: PORTABLE CHEST 1 VIEW COMPARISON:  Chest radiograph 04/30/2021. FINDINGS: Stable cardiomegaly. Unchanged mediastinal  contours with aortic atherosclerosis. Again seen retrocardiac hiatal hernia. Subsegmental atelectasis at the left lung base. No confluent consolidation, pleural effusion, or pneumothorax. Patient is rotated accounting for right tracheal positioning. No acute osseous abnormalities are seen. IMPRESSION: Stable cardiomegaly and retrocardiac hiatal hernia. Subsegmental left basilar atelectasis. Electronically Signed   By: Keith Rake M.D.   On: 10/25/2021 15:37    Procedures Procedures   Medications Ordered in ED Medications  diltiazem (CARDIZEM) injection 10 mg (10 mg Intravenous Not Given 10/25/21 1554)  diltiazem (CARDIZEM) tablet 30 mg (has no administration in time range)  diltiazem (CARDIZEM) injection 10 mg (10 mg Intravenous Given 10/25/21 1423)  lactated ringers bolus 1,000 mL (1,000 mLs Intravenous New Bag/Given 10/25/21 1450)    ED Course  I have reviewed the triage vital signs and the nursing notes.  Pertinent labs & imaging results that were available during my care of the patient were reviewed by me and considered in my medical decision making (see chart for details).    MDM Rules/Calculators/A&P                           Patient seen the emergency department for evaluation of fatigue, palpitations and concern for A. fib with RVR.  Initial physical exam reveals a well-appearing patient with an irregular tachycardia but is otherwise unremarkable.  Laboratory evaluation with a CBC showing a leukocytosis of 14.7, hemoglobin 15.4, creatinine 1.08, normal troponin.  Patient initially given 10 mg of diltiazem which improved the patient's heart rate but the patient remained in atrial fibrillation.  She was then given a liter of fluids and an additional dose of diltiazem and the patient was signed out to oncoming provider.  The plan will be to consult cardiology and likely ED cardioversion vs diltiazem drip if we are unable to chemically cardiovert the patient in the emergency  department. Final Clinical Impression(s) / ED Diagnoses Final diagnoses:  None    Rx / DC Orders ED Discharge Orders     None        Nayomi Tabron, Debe Coder, MD 10/25/21 [phone]

## 2021-10-25 NOTE — ED Notes (Signed)
Purewick still in  place

## 2021-10-25 NOTE — H&P (Addendum)
History and Physical    Martha Torres KDX:[id number] DOB: Jan 24, 1946 DOA: 10/25/2021  PCP: Celene Squibb, MD   Patient coming from: Home  I have personally briefly reviewed patient's old medical records in [place]  Chief Complaint: Feeling Drained  HPI: Martha Torres is a 75 y.o. female with medical history significant for atrial fibrillation, diabetes mellitus, hypertension, CVA. Patient presented to the ED with complaints of feeling drained today.  She reports she feels she has been in atrial fibrillation for the first 3 of the past 4 days.  Yesterday which was okay.  But today she is felt drained.  He did not feel she was in atrial fibrillation today.  She reports she knows that she is in atrial fibrillation when she has chest pain.  She reports she checked her heart rate, but cannot remember the rate.   She has not had any chest pains today.  She is on Xarelto and reports compliance.  ED Course: Heart rate up to 140s noted in ED, blood pressure systolic [phone].  Troponin 10.  WBC 14.7.  Portable chest x-ray shows left basilar atelectasis stable cardiomegaly, retrocardiac hiatal hernia. Patient initially received 30 mg of oral Cardizem with plans for discharge from ED if heart rate improved.  But with persistent tachycardia, 10 mg bolus Cardizem given  X 2 , patient was started on Cardizem drip.  1 L bolus also given.  Review of Systems: As per HPI all other systems reviewed and negative.  Past Medical History:  Diagnosis Date   Carotid artery disease ([place])    Right CEA in 2002; right carotid artery stent 2021   Chest pain 2006   Stress nuclear-anteroapical and basilar inferior reversible defects; cath-normal coronary arteries and EF   Chronic low back pain    MRI 2014: Spondylolisthesis of L4-L5 with foraminal narrowing, most prominent on the right and facet arthropathy.  L4-L5 surgery planned for 04/2013 with left-sided pedicle screw fixation.   Degenerative joint  disease    Depression    Diarrhea, functional    DVT (deep venous thrombosis) (HCC)    Essential hypertension    Gastroesophageal reflux    H/o stricture & hiatal hernia   History of kidney stones    Hyperlipidemia    Hypothyroidism    MRSA (methicillin resistant Staphylococcus aureus) June 2014   Paroxysmal atrial fibrillation ([place]) 04/2013   Type 2 diabetes mellitus ([place])     Past Surgical History:  Procedure Laterality Date   BIOPSY  02/26/2019   Procedure: BIOPSY;  Surgeon: Danie Binder, MD;  [facility];  Service: Endoscopy;;  random colon bx's   CAROTID ENDARTERECTOMY Right 10/25/2001   Subsequent to episode of syncope   COLONOSCOPY N/A 02/26/2019   Procedure: COLONOSCOPY;  Surgeon: Danie Binder, MD; left colon s/p biopsy, moderate diverticulosis in the rectosigmoid, sigmoid, descending, and transverse colon, external and internal hemorrhoids, no obvious source of diarrhea identified.  Random colon biopsies were benign.     LAMINECTOMY  2000   L5-S1 discectomy and laminectomy; 3 other surgical procedures subsequently performed   [place]  05-23-13   X's 5 surgeries   TRANSCAROTID ARTERY REVASCULARIZATION  Right 04/21/2020   Procedure: TRANSCAROTID ARTERY REVASCULARIZATION RIGHT;  Surgeon: Angelia Mould, MD;  Location: [facility];  Service: Vascular;  Laterality: Right;   VAGINAL HYSTERECTOMY       reports that she quit smoking about 12 years ago. Her  smoking use included cigarettes. She started smoking about 56 years ago. She has a 10.75 pack-year smoking history. She has never used smokeless tobacco. She reports that she does not drink alcohol and does not use drugs.  No Known Allergies  Family History  Problem Relation Age of Onset   Heart attack Father        Also mother   Cancer - Prostate Father    Heart disease Father        Heart Disease before age 72   Hyperlipidemia Father    Hypertension Sister     Hyperlipidemia Sister    Cancer Sister    Stroke Mother    Heart disease Mother        Heart Disease before age 74   Hyperlipidemia Mother    Heart attack Mother    Diabetes Sister    Hyperlipidemia Sister    Hypertension Sister    Hyperlipidemia Brother    Cancer Brother        Luekemia   Hypertension Brother    Colon cancer Neg Hx     Prior to Admission medications   Medication Sig Start Date End Date Taking? Authorizing Provider  acetaminophen (TYLENOL) 650 MG CR tablet Take 650 mg by mouth daily as needed.   Yes [provider]  ALPRAZolam Duanne Moron) 0.5 MG tablet Take 0.5 mg by mouth at bedtime.   Yes [provider]  aspirin EC 81 MG tablet Take 81 mg by mouth daily.   Yes [provider]  atenolol (TENORMIN) 50 MG tablet Take 1 tablet (50 mg total) by mouth 2 (two) times daily. 09/18/18  Yes Satira Sark, MD  chlorthalidone (HYGROTON) 25 MG tablet Take 25 mg by mouth daily.  05/07/13  Yes [provider]  diltiazem (CARDIZEM) 30 MG tablet TAKE 1 TABLET BY MOUTH  TWICE DAILY AS NEEDED AT  ONSET OF RAPID HEART RATE 10/22/21  Yes Satira Sark, MD  gabapentin (NEURONTIN) 300 MG capsule Take 300 mg by mouth every 8 (eight) hours.    Yes [provider]  glipiZIDE (GLUCOTROL XL) 5 MG 24 hr tablet Take 5 mg by mouth 2 (two) times daily.    Yes [provider]  levothyroxine (SYNTHROID, LEVOTHROID) 50 MCG tablet Take 50 mcg by mouth daily before breakfast.    Yes [provider]  losartan (COZAAR) 100 MG tablet Take 1 tablet (100 mg total) by mouth daily. 05/20/21 10/25/21 Yes Satira Sark, MD  MAGNESIUM PO Take 1 tablet by mouth daily.   Yes [provider]  Multiple Vitamin (MULTIVITAMIN WITH MINERALS) TABS tablet Take 1 tablet by mouth daily.   Yes [provider]  omeprazole (PRILOSEC) 20 MG capsule Take 20 mg by mouth daily.   Yes [provider]  potassium chloride SA  (K-DUR,KLOR-CON) 20 MEQ tablet Take 2 tablets (40 mEq total) by mouth every morning. & 20 meq in the afternoon Patient taking differently: Take 20-40 mEq by mouth See admin instructions. 40 meq in the morning and 20 meq in the bedtime 02/27/19  Yes Satira Sark, MD  rivaroxaban (XARELTO) 20 MG TABS tablet Take 1 tablet (20 mg total) by mouth daily with supper. 12/15/20  Yes Satira Sark, MD  rosuvastatin (CRESTOR) 10 MG tablet Take 10 mg by mouth at bedtime. 03/06/20  Yes [provider]  VITAMIN D, CHOLECALCIFEROL, PO Take 1 capsule by mouth daily.   Yes [provider]  acetaminophen (TYLENOL) 650 MG  CR tablet Take 650 mg by mouth 2 (two) times daily. Patient not taking: Reported on 10/25/2021    [provider]  Carboxymethylcellul-Glycerin (LUBRICATING EYE DROPS OP) Place 2 drops into both eyes daily as needed (dry eyes). Patient not taking: Reported on 10/25/2021    [provider]  diclofenac Sodium (VOLTAREN) 1 % GEL Apply 2 g topically 2 (two) times daily as needed (pain).  Patient not taking: Reported on 10/25/2021    [provider]    Physical Exam: Vitals:   10/25/21 1815 10/25/21 1821 10/25/21 1830 10/25/21 1845  BP: (!) 165/137  (!) 62/51 109/73  Pulse: (!) 113 (!) 113 (!) 101 95  Resp: (!) 21 17 15  (!) 23  Temp:      TempSrc:      SpO2: 96% 97% 97% 98%  Weight:      Height:        Constitutional: NAD, calm, comfortable Vitals:   10/25/21 1815 10/25/21 1821 10/25/21 1830 10/25/21 1845  BP: (!) 165/137  (!) 62/51 109/73  Pulse: (!) 113 (!) 113 (!) 101 95  Resp: (!) 21 17 15  (!) 23  Temp:      TempSrc:      SpO2: 96% 97% 97% 98%  Weight:      Height:       Eyes: PERRL, lids and conjunctivae normal ENMT: Mucous membranes are moist.  Neck: normal, supple, no masses, no thyromegaly Respiratory: clear to auscultation bilaterally, no wheezing, no crackles. Normal respiratory effort. No accessory muscle use.   Cardiovascular: Irregular rate and rhythm, no murmurs / rubs / gallops. No extremity edema. 2+ pedal pulses.  Abdomen: no tenderness, no masses palpated. No hepatosplenomegaly. Bowel sounds positive.  Musculoskeletal: no clubbing / cyanosis. Normal muscle tone.  Skin: no rashes, lesions, ulcers. No induration Neurologic: No apparent cranial nerve abnormality, moving extremities spontaneously.  Psychiatric: Normal judgment and insight. Alert and oriented x 3. Normal mood.   Labs on Admission: I have personally reviewed following labs and imaging studies  CBC: Recent Labs  Lab 10/25/21 1400  WBC 14.7*  NEUTROABS 9.0*  HGB 15.4*  HCT 48.5*  MCV 90.3  PLT 767   Basic Metabolic Panel: Recent Labs  Lab 10/25/21 1400 10/25/21 1657  NA 134*  --   K 3.8  --   CL 97*  --   CO2 27  --   GLUCOSE 151*  --   BUN 23  --   CREATININE 1.08*  --   CALCIUM 9.2  --   MG  --  1.8   Liver Function Tests: Recent Labs  Lab 10/25/21 1400  AST 14*  ALT 20  ALKPHOS 78  BILITOT 0.6  PROT 6.8  ALBUMIN 3.8    Radiological Exams on Admission: DG Chest Port 1 View  Result Date: 10/25/2021 CLINICAL DATA:  Shortness of breath. EXAM: PORTABLE CHEST 1 VIEW COMPARISON:  Chest radiograph 04/30/2021. FINDINGS: Stable cardiomegaly. Unchanged mediastinal contours with aortic atherosclerosis. Again seen retrocardiac hiatal hernia. Subsegmental atelectasis at the left lung base. No confluent consolidation, pleural effusion, or pneumothorax. Patient is rotated accounting for right tracheal positioning. No acute osseous abnormalities are seen. IMPRESSION: Stable cardiomegaly and retrocardiac hiatal hernia. Subsegmental left basilar atelectasis. Electronically Signed   By: Keith Rake M.D.   On: 10/25/2021 15:37    EKG: Rate 135, QTc 432.  No ST or T wave changes compared to prior.  Assessment/Plan Principal Problem:   Atrial fibrillation with rapid ventricular response (HCC)  Active Problems:    Hypertension   Cerebrovascular disease   Carotid artery stenosis    Atrial fibrillation with RVR-history of paroxysmal atrial fibrillation.  Heart rates to 140s in the ED, given total of 20 mg bolus Cardizem, now on Cardizem drip.  Compliant with anticoagulation.  She is on atenolol 50 twice daily and 30 mg Cardizem as needed.  Denies alcohol intake. - Last echo is from 2015 showed mild LVH, grade 1 DD, EF 60 to 65%. -Resume Xarelto and aspirin -Check magnesium-1.8, TSH. -Continue Cardizem drip  Chest pain- related to atrial fibrillation.  No chest pain today.  History carotid artery disease but no documented coronary artery disease history.  Troponins 10 > 10, EKG so far not suggestive of ACS.  Follows with Dr. Conni Elliot. -Continue aspirin, Crestor, atenolol.  Leukocytosis 14.7.  No signs to suggest infectious etiology at this time. -Trend.  Hypertension -stable. -Continue diltiazem drip -Resume atenolol 50 mg twice daily -Hold losartan and HCTZ to allow for adjustment of rate limiting medications.  Carotid artery disease-endarterectomy 2002, transcarotid stenting 2001. -Continue aspirin and Xarelto  DM-glucose 151 -Resume glipizide 5 mg 2 times daily - SSI- S - HgbA1c   DVT prophylaxis: Xarelto Code Status: Full code Family Communication: Friend at bedside Disposition Plan: ~ 2 days Consults called: None Admission status:  Inpt, tele  I certify that at the point of admission it is my clinical judgment that the patient will require inpatient hospital care spanning beyond 2 midnights from the point of admission due to high intensity of service, high risk for further deterioration and high frequency of surveillance required.    Bethena Roys MD [facility]  10/25/2021, 7:37 PM

## 2021-10-25 NOTE — ED Triage Notes (Signed)
Pt. States they have been in A-fib three times this week. Denies feeling chest pain or palpitations at this time. Pt. States they are "drained".

## 2021-10-26 DIAGNOSIS — I10 Essential (primary) hypertension: Secondary | ICD-10-CM | POA: Diagnosis not present

## 2021-10-26 DIAGNOSIS — I6521 Occlusion and stenosis of right carotid artery: Secondary | ICD-10-CM | POA: Diagnosis not present

## 2021-10-26 DIAGNOSIS — I4891 Unspecified atrial fibrillation: Secondary | ICD-10-CM | POA: Diagnosis present

## 2021-10-26 LAB — CBC
HCT: 43.2 % (ref 36.0–46.0)
Hemoglobin: 13.9 g/dL (ref 12.0–15.0)
MCH: 28.9 pg (ref 26.0–34.0)
MCHC: 32.2 g/dL (ref 30.0–36.0)
MCV: 89.8 fL (ref 80.0–100.0)
Platelets: 256 10*3/uL (ref 150–400)
RBC: 4.81 MIL/uL (ref 3.87–5.11)
RDW: 16.8 % — ABNORMAL HIGH (ref 11.5–15.5)
WBC: 14.1 10*3/uL — ABNORMAL HIGH (ref 4.0–10.5)
nRBC: 0 % (ref 0.0–0.2)

## 2021-10-26 LAB — BASIC METABOLIC PANEL
Anion gap: 9 (ref 5–15)
BUN: 17 mg/dL (ref 8–23)
CO2: 26 mmol/L (ref 22–32)
Calcium: 8.9 mg/dL (ref 8.9–10.3)
Chloride: 101 mmol/L (ref 98–111)
Creatinine, Ser: 0.89 mg/dL (ref 0.44–1.00)
GFR, Estimated: >60 mL/min (ref >60)
Glucose, Bld: 208 mg/dL — ABNORMAL HIGH (ref 70–99)
Potassium: 3.5 mmol/L (ref 3.5–5.1)
Sodium: 136 mmol/L (ref 135–145)

## 2021-10-26 LAB — CBG MONITORING, ED
Glucose-Capillary: 152 mg/dL — ABNORMAL HIGH (ref 70–99)
Glucose-Capillary: 169 mg/dL — ABNORMAL HIGH (ref 70–99)
Glucose-Capillary: 179 mg/dL — ABNORMAL HIGH (ref 70–99)

## 2021-10-26 LAB — HEMOGLOBIN A1C
Hgb A1c MFr Bld: 7.7 % — ABNORMAL HIGH (ref 4.8–5.6)
Mean Plasma Glucose: 174.29 mg/dL

## 2021-10-26 MED ORDER — INSULIN ASPART 100 UNIT/ML IJ SOLN
3.0000 [IU] | Freq: Three times a day (TID) | INTRAMUSCULAR | Status: DC
  Administered 2021-10-26: 3 [IU] via SUBCUTANEOUS
  Filled 2021-10-26: qty 1

## 2021-10-26 MED ORDER — INSULIN ASPART 100 UNIT/ML IJ SOLN: 0.0000 [IU] | Freq: Every day | INTRAMUSCULAR | Status: DC

## 2021-10-26 MED ORDER — INSULIN ASPART 100 UNIT/ML IJ SOLN
0.0000 [IU] | Freq: Three times a day (TID) | INTRAMUSCULAR | Status: DC
  Administered 2021-10-26 (×2): 3 [IU] via SUBCUTANEOUS
  Filled 2021-10-26 (×2): qty 1

## 2021-10-26 MED ORDER — POTASSIUM CHLORIDE CRYS ER 20 MEQ PO TBCR: 20.0000 meq | EXTENDED_RELEASE_TABLET | ORAL | Status: AC

## 2021-10-26 MED ORDER — DILTIAZEM HCL-DEXTROSE 125-5 MG/125ML-% IV SOLN (PREMIX)
5.0000 mg/h | INTRAVENOUS | Status: AC
  Administered 2021-10-26: 5 mg/h via INTRAVENOUS
  Filled 2021-10-26: qty 125

## 2021-10-26 MED ORDER — LOSARTAN POTASSIUM 25 MG PO TABS
25.0000 mg | ORAL_TABLET | Freq: Every day | ORAL | 1 refills | Status: DC
Start: 2021-10-26 — End: 2022-02-10

## 2021-10-26 NOTE — Care Management CC44 (Signed)
Condition Code 44 Documentation Completed  Patient Details  Name: Martha Torres MRN: [id number] Date of Birth: 1946/02/24   Condition Code 44 given:  Yes Patient signature on Condition Code 44 notice:  Yes Documentation of 2 MD's agreement:  Yes Code 44 added to claim:  Yes    Salome Arnt, [place] 10/26/2021, 2:23 PM

## 2021-10-26 NOTE — Discharge Summary (Signed)
Physician Discharge Summary  Martha Torres ZOX:[id number] DOB: [birth year] DOA: 10/25/2021  PCP: Celene Squibb, MD Cardiologist: Dr. Domenic Polite  Admit date: 10/25/2021 Discharge date: 10/26/2021  Admitted From:  Home  Disposition: Home   Recommendations for Outpatient Follow-up:  Follow up with PCP in 1 weeks Follow up with cardiologist 11/23/21 as scheduled   Discharge Condition: STABLE   CODE STATUS: FULL DIET: heart healthy recommended    Brief Hospitalization Summary: Please see all hospital notes, images, labs for full details of the hospitalization. Admission HPI:   Martha Torres is a 75 y.o. female with medical history significant for atrial fibrillation, diabetes mellitus, hypertension, CVA.  Patient presented to the ED with complaints of feeling drained today.  She reports she feels she has been in atrial fibrillation for the first 3 of the past 4 days.  Yesterday which was okay.  But today she is felt drained.  He did not feel she was in atrial fibrillation today.  She reports she knows that she is in atrial fibrillation when she has chest pain.  She reports she checked her heart rate, but cannot remember the rate.   She has not had any chest pains today.     She is on Xarelto and reports compliance.   ED Course: Heart rate up to 140s noted in ED, blood pressure systolic 811-914.  Troponin 10.  WBC 14.7.  Portable chest x-ray shows left basilar atelectasis stable cardiomegaly, retrocardiac hiatal hernia. Patient initially received 30 mg of oral Cardizem with plans for discharge from ED if heart rate improved.  But with persistent tachycardia, 10 mg bolus Cardizem given  X 2 , patient was started on Cardizem drip.  1 L bolus also given.   HOSPITAL COURSE   Patient remained in the ED for the entirety of her stay due to no beds available on the floor.  She was placed on an IV Cardizem infusion at 5 mg/h.  This controlled her heart rate very well.  She was restarted on her home  atenolol 50 mg twice daily and weaned off of the IV Cardizem infusion.  We monitored her and her heart rate remained very well controlled in the 60-80 range.  She is feeling well and she insists on going home today.  She says that she has got to get back to work tomorrow and not willing to stay another night.  I told her that if she has recurrence of palpitations or heart rate greater than 100 that she should take the oral diltiazem 30 mg tablet that was prescribed to her by her cardiologist to take in those situations.  She seems to have responded very well to the Cardizem low-dose treatment given in the ED.  She has no further symptoms of chest discomfort.  She wants to go home.  She says that she will follow-up with Dr. Domenic Polite.  At this point I do not have a reason to keep her longer against her wishes and we will discharge her home to have outpatient follow-up.  Discharge Diagnoses:  Principal Problem:   Atrial fibrillation with rapid ventricular response [facility]) Active Problems:   Hypertension   Carotid artery stenosis   Atrial fibrillation with [facility])   Discharge Instructions: Discharge Instructions     Ambulatory referral to Cardiology   Complete by: As directed    2 week  hospital follow up      Allergies as of 10/26/2021   No Known Allergies  Medication List     STOP taking these medications    diclofenac Sodium 1 % Gel Commonly known as: VOLTAREN   LUBRICATING EYE DROPS OP       TAKE these medications    acetaminophen 650 MG CR tablet Commonly known as: TYLENOL Take 650 mg by mouth daily as needed. What changed: Another medication with the same name was removed. Continue taking this medication, and follow the directions you see here.   ALPRAZolam 0.5 MG tablet Commonly known as: XANAX Take 0.5 mg by mouth at bedtime.   aspirin EC 81 MG tablet Take 81 mg by mouth daily.   atenolol 50 MG tablet Commonly known as: TENORMIN Take 1 tablet (50 mg  total) by mouth 2 (two) times daily.   chlorthalidone 25 MG tablet Commonly known as: HYGROTON Take 25 mg by mouth daily.   diltiazem 30 MG tablet Commonly known as: CARDIZEM TAKE 1 TABLET BY MOUTH  TWICE DAILY AS NEEDED AT  ONSET OF RAPID HEART RATE   gabapentin 300 MG capsule Commonly known as: NEURONTIN Take 300 mg by mouth every 8 (eight) hours.   glipiZIDE 5 MG 24 hr tablet Commonly known as: GLUCOTROL XL Take 5 mg by mouth 2 (two) times daily.   levothyroxine 50 MCG tablet Commonly known as: SYNTHROID Take 50 mcg by mouth daily before breakfast.   losartan 25 MG tablet Commonly known as: COZAAR Take 1 tablet (25 mg total) by mouth daily. What changed:  medication strength how much to take   MAGNESIUM PO Take 1 tablet by mouth daily.   multivitamin with minerals Tabs tablet Take 1 tablet by mouth daily.   omeprazole 20 MG capsule Commonly known as: PRILOSEC Take 20 mg by mouth daily.   potassium chloride SA 20 MEQ tablet Commonly known as: KLOR-CON Take 1-2 tablets (20-40 mEq total) by mouth See admin instructions. 40 meq in the morning and 20 meq in the bedtime   rivaroxaban 20 MG Tabs tablet Commonly known as: XARELTO Take 1 tablet (20 mg total) by mouth daily with supper.   rosuvastatin 10 MG tablet Commonly known as: CRESTOR Take 10 mg by mouth at bedtime.   VITAMIN D (CHOLECALCIFEROL) PO Take 1 capsule by mouth daily.        Follow-up Information     Celene Squibb, MD. Schedule an appointment as soon as possible for a visit in 1 week(s).   Specialty: Internal Medicine Why: Hospital Follow Up Contact information: [address] [place] [facility] [address] [phone]         Satira Sark, MD. Schedule an appointment as soon as possible for a visit in 2 week(s).   Specialty: Cardiology Why: Hospital Follow Up Contact information: [place] [address] [phone]                No Known  Allergies Allergies as of 10/26/2021   No Known Allergies      Medication List     STOP taking these medications    diclofenac Sodium 1 % Gel Commonly known as: VOLTAREN   LUBRICATING EYE DROPS OP       TAKE these medications    acetaminophen 650 MG CR tablet Commonly known as: TYLENOL Take 650 mg by mouth daily as needed. What changed: Another medication with the same name was removed. Continue taking this medication, and follow the directions you see here.   ALPRAZolam 0.5 MG tablet Commonly known as:  XANAX Take 0.5 mg by mouth at bedtime.   aspirin EC 81 MG tablet Take 81 mg by mouth daily.   atenolol 50 MG tablet Commonly known as: TENORMIN Take 1 tablet (50 mg total) by mouth 2 (two) times daily.   chlorthalidone 25 MG tablet Commonly known as: HYGROTON Take 25 mg by mouth daily.   diltiazem 30 MG tablet Commonly known as: CARDIZEM TAKE 1 TABLET BY MOUTH  TWICE DAILY AS NEEDED AT  ONSET OF RAPID HEART RATE   gabapentin 300 MG capsule Commonly known as: NEURONTIN Take 300 mg by mouth every 8 (eight) hours.   glipiZIDE 5 MG 24 hr tablet Commonly known as: GLUCOTROL XL Take 5 mg by mouth 2 (two) times daily.   levothyroxine 50 MCG tablet Commonly known as: SYNTHROID Take 50 mcg by mouth daily before breakfast.   losartan 25 MG tablet Commonly known as: COZAAR Take 1 tablet (25 mg total) by mouth daily. What changed:  medication strength how much to take   MAGNESIUM PO Take 1 tablet by mouth daily.   multivitamin with minerals Tabs tablet Take 1 tablet by mouth daily.   omeprazole 20 MG capsule Commonly known as: PRILOSEC Take 20 mg by mouth daily.   potassium chloride SA 20 MEQ tablet Commonly known as: KLOR-CON Take 1-2 tablets (20-40 mEq total) by mouth See admin instructions. 40 meq in the morning and 20 meq in the bedtime   rivaroxaban 20 MG Tabs tablet Commonly known as: XARELTO Take 1 tablet (20 mg total) by mouth daily with  supper.   rosuvastatin 10 MG tablet Commonly known as: CRESTOR Take 10 mg by mouth at bedtime.   VITAMIN D (CHOLECALCIFEROL) PO Take 1 capsule by mouth daily.        Procedures/Studies: DG Chest Port 1 View  Result Date: 10/25/2021 CLINICAL DATA:  Shortness of breath. EXAM: PORTABLE CHEST 1 VIEW COMPARISON:  Chest radiograph 04/30/2021. FINDINGS: Stable cardiomegaly. Unchanged mediastinal contours with aortic atherosclerosis. Again seen retrocardiac hiatal hernia. Subsegmental atelectasis at the left lung base. No confluent consolidation, pleural effusion, or pneumothorax. Patient is rotated accounting for right tracheal positioning. No acute osseous abnormalities are seen. IMPRESSION: Stable cardiomegaly and retrocardiac hiatal hernia. Subsegmental left basilar atelectasis. Electronically Signed   By: Keith Rake M.D.   On: 10/25/2021 15:37     Subjective: Patient says she feels well she is not having palpitations or chest discomfort.  She denies shortness of breath.  She says that she wants to go home today that she has to get back to work tomorrow and now willing to stay another night in the hospital.  She is tired of holding in the emergency department which she has been here for several hours waiting for bed and she has had enough.   Discharge Exam: Vitals:   10/26/21 1200 10/26/21 1215  BP: 133/87 123/83  Pulse: 85 71  Resp: 18 18  Temp:    SpO2: 98% 98%   Vitals:   10/26/21 1130 10/26/21 1145 10/26/21 1200 10/26/21 1215  BP: 115/69 116/73 133/87 123/83  Pulse:  70 85 71  Resp: 14 (!) 22 18 18   Temp:      TempSrc:      SpO2: 95% 98% 98% 98%  Weight:      Height:       General: Pt is alert, awake, not in acute distress Cardiovascular: irregularly irregular, normal S1/S2 +, no rubs, no gallops Respiratory: CTA bilaterally, no wheezing, no rhonchi Abdominal: Soft,  NT, ND, bowel sounds + Extremities: no edema, no cyanosis   The results of significant diagnostics  from this hospitalization (including imaging, microbiology, ancillary and laboratory) are listed below for reference.     Microbiology: Recent Results (from the past 240 hour(s))  Resp Panel by RT-PCR (Flu A&B, Covid) Nasopharyngeal Swab     Status: None   Collection Time: 10/25/21  6:34 PM   Specimen: Nasopharyngeal Swab; Nasopharyngeal(NP) swabs in vial transport medium  Result Value Ref Range Status   SARS Coronavirus 2 by RT PCR NEGATIVE NEGATIVE Final    Comment: (NOTE) SARS-CoV-2 target nucleic acids are NOT DETECTED.  The SARS-CoV-2 RNA is generally detectable in upper respiratory specimens during the acute phase of infection. The lowest concentration of SARS-CoV-2 viral copies this assay can detect is 138 copies/mL. A negative result does not preclude SARS-Cov-2 infection and should not be used as the sole basis for treatment or other patient management decisions. A negative result may occur with  improper specimen collection/handling, submission of specimen other than nasopharyngeal swab, presence of viral mutation(s) within the areas targeted by this assay, and inadequate number of viral copies(<138 copies/mL). A negative result must be combined with clinical observations, patient history, and epidemiological information. The expected result is Negative.  Fact Sheet for Patients:  EntrepreneurPulse.com.au  Fact Sheet for Healthcare Providers:  IncredibleEmployment.be  This test is no t yet approved or cleared by the [place] FDA and  has been authorized for detection and/or diagnosis of SARS-CoV-2 by FDA under an Emergency Use Authorization (EUA). This EUA will remain  in effect (meaning this test can be used) for the duration of the COVID-19 declaration under Section 564(b)(1) of the Act, 21 U.S.C.section 360bbb-3(b)(1), unless the authorization is terminated  or revoked sooner.       Influenza A by PCR NEGATIVE NEGATIVE  Final   Influenza B by PCR NEGATIVE NEGATIVE Final    Comment: (NOTE) The Xpert Xpress SARS-CoV-2/FLU/RSV plus assay is intended as an aid in the diagnosis of influenza from Nasopharyngeal swab specimens and should not be used as a sole basis for treatment. Nasal washings and aspirates are unacceptable for Xpert Xpress SARS-CoV-2/FLU/RSV testing.  Fact Sheet for Patients: EntrepreneurPulse.com.au  Fact Sheet for Healthcare Providers: IncredibleEmployment.be  This test is not yet approved or cleared by the [place] FDA and has been authorized for detection and/or diagnosis of SARS-CoV-2 by FDA under an Emergency Use Authorization (EUA). This EUA will remain in effect (meaning this test can be used) for the duration of the COVID-19 declaration under Section 564(b)(1) of the Act, 21 U.S.C. section 360bbb-3(b)(1), unless the authorization is terminated or revoked.  Performed at [facility], [address] [place], [place] [address]      Labs: BNP (last 3 results) No results for input(s): BNP in the last 8760 hours. Basic Metabolic Panel: Recent Labs  Lab 10/25/21 1400 10/25/21 1657 10/26/21 0138  NA 134*  --  136  K 3.8  --  3.5  CL 97*  --  101  CO2 27  --  26  GLUCOSE 151*  --  208*  BUN 23  --  17  CREATININE 1.08*  --  0.89  CALCIUM 9.2  --  8.9  MG  --  1.8  --    Liver Function Tests: Recent Labs  Lab 10/25/21 1400  AST 14*  ALT 20  ALKPHOS 78  BILITOT 0.6  PROT 6.8  ALBUMIN 3.8   No results for input(s):  LIPASE, AMYLASE in the last 168 hours. No results for input(s): AMMONIA in the last 168 hours. CBC: Recent Labs  Lab 10/25/21 1400 10/26/21 0138  WBC 14.7* 14.1*  NEUTROABS 9.0*  --   HGB 15.4* 13.9  HCT 48.5* 43.2  MCV 90.3 89.8  PLT 309 256   Cardiac Enzymes: No results for input(s): CKTOTAL, CKMB, CKMBINDEX, TROPONINI in the last 168 hours. BNP: Invalid input(s): POCBNP CBG: Recent Labs  Lab  10/25/21 2104 10/26/21 0805 10/26/21 1401  GLUCAP 152* 169* 179*   D-Dimer No results for input(s): DDIMER in the last 72 hours. Hgb A1c Recent Labs    10/26/21 0138  HGBA1C 7.7*   Lipid Profile No results for input(s): CHOL, HDL, LDLCALC, TRIG, CHOLHDL, LDLDIRECT in the last 72 hours. Thyroid function studies Recent Labs    10/25/21 1305  TSH 1.343   Anemia work up No results for input(s): VITAMINB12, FOLATE, FERRITIN, TIBC, IRON, RETICCTPCT in the last 72 hours. Urinalysis No results found for: COLORURINE, APPEARANCEUR, [place], [place], GLUCOSEU, [place], [place], [place], PROTEINUR, UROBILINOGEN, NITRITE, LEUKOCYTESUR Sepsis Labs Invalid input(s): PROCALCITONIN,  WBC,  LACTICIDVEN Microbiology Recent Results (from the past 240 hour(s))  Resp Panel by RT-PCR (Flu A&B, Covid) Nasopharyngeal Swab     Status: None   Collection Time: 10/25/21  6:34 PM   Specimen: Nasopharyngeal Swab; Nasopharyngeal(NP) swabs in vial transport medium  Result Value Ref Range Status   SARS Coronavirus 2 by RT PCR NEGATIVE NEGATIVE Final    Comment: (NOTE) SARS-CoV-2 target nucleic acids are NOT DETECTED.  The SARS-CoV-2 RNA is generally detectable in upper respiratory specimens during the acute phase of infection. The lowest concentration of SARS-CoV-2 viral copies this assay can detect is 138 copies/mL. A negative result does not preclude SARS-Cov-2 infection and should not be used as the sole basis for treatment or other patient management decisions. A negative result may occur with  improper specimen collection/handling, submission of specimen other than nasopharyngeal swab, presence of viral mutation(s) within the areas targeted by this assay, and inadequate number of viral copies(<138 copies/mL). A negative result must be combined with clinical observations, patient history, and epidemiological information. The expected result is Negative.  Fact Sheet for Patients:   EntrepreneurPulse.com.au  Fact Sheet for Healthcare Providers:  IncredibleEmployment.be  This test is no t yet approved or cleared by the [place] FDA and  has been authorized for detection and/or diagnosis of SARS-CoV-2 by FDA under an Emergency Use Authorization (EUA). This EUA will remain  in effect (meaning this test can be used) for the duration of the COVID-19 declaration under Section 564(b)(1) of the Act, 21 U.S.C.section 360bbb-3(b)(1), unless the authorization is terminated  or revoked sooner.       Influenza A by PCR NEGATIVE NEGATIVE Final   Influenza B by PCR NEGATIVE NEGATIVE Final    Comment: (NOTE) The Xpert Xpress SARS-CoV-2/FLU/RSV plus assay is intended as an aid in the diagnosis of influenza from Nasopharyngeal swab specimens and should not be used as a sole basis for treatment. Nasal washings and aspirates are unacceptable for Xpert Xpress SARS-CoV-2/FLU/RSV testing.  Fact Sheet for Patients: EntrepreneurPulse.com.au  Fact Sheet for Healthcare Providers: IncredibleEmployment.be  This test is not yet approved or cleared by the [place] FDA and has been authorized for detection and/or diagnosis of SARS-CoV-2 by FDA under an Emergency Use Authorization (EUA). This EUA will remain in effect (meaning this test can be used) for the duration of the COVID-19 declaration under Section 564(b)(1) of the  Act, 21 U.S.C. section 360bbb-3(b)(1), unless the authorization is terminated or revoked.  Performed at [facility], [address], [place], [place] [address]    Time coordinating discharge:   SIGNED:  Irwin Brakeman, MD  [facility] 10/26/2021, 2:15 PM How to contact the [facility] Attending or Consulting provider [place] or covering provider during after hours [place], for this patient?  Check the care team in [facility] and look for a) attending/consulting [facility] provider listed and  b) the [facility] team listed Log into [email or web address] and use [place]'s universal password to access. If you do not have the password, please contact the hospital operator. Locate the [facility] provider you are looking for under [facility] and page to a number that you can be directly reached. If you still have difficulty reaching the provider, please page the [facility] (Director on Call) for the Hospitalists listed on amion for assistance.

## 2021-10-26 NOTE — Discharge Instructions (Signed)

## 2021-10-26 NOTE — Care Management Obs Status (Signed)
[place] NOTIFICATION   Patient Details  Name: Martha Torres MRN: [id number] Date of Birth: 15-Oct-1946   Medicare Observation Status Notification Given:  Yes    Salome Arnt, [place] 10/26/2021, 2:23 PM

## 2021-10-27 ENCOUNTER — Telehealth: Payer: Self-pay | Admitting: Cardiology

## 2021-10-27 NOTE — Telephone Encounter (Signed)
Advised to take medications as prescribed at discharge including losartan 25 mg daily. Advised to monitor BP & HR at home. Advise to contact our office if she notices her BP increasing. Request sooner visit since [facility] said to follow up around 11/09/21. Advised that d/c summary says to follow up as planned on 11/23/21. Patient request sooner visit. Appointment given for 11/09/21 with Martha Dung, NP. Advised that planned visit for 11/23/21 may be canceled at this visit. Verbalized understanding of plan.

## 2021-10-27 NOTE — Telephone Encounter (Signed)
Pt c/o medication issue:  1. Name of Medication:  losartan (COZAAR) 25 MG tablet  2. How are you currently taking this medication (dosage and times per day)? As prescribed   3. Are you having a reaction (difficulty breathing--STAT)? No   4. What is your medication issue? Hospital changed the dosage from 100 MG's to 25 MG's. Wants to confirm Dr. Domenic Polite is in agreement with this.

## 2021-11-04 DIAGNOSIS — I10 Essential (primary) hypertension: Secondary | ICD-10-CM | POA: Diagnosis not present

## 2021-11-04 DIAGNOSIS — I6521 Occlusion and stenosis of right carotid artery: Secondary | ICD-10-CM | POA: Diagnosis not present

## 2021-11-04 DIAGNOSIS — J302 Other seasonal allergic rhinitis: Secondary | ICD-10-CM | POA: Diagnosis not present

## 2021-11-04 DIAGNOSIS — E039 Hypothyroidism, unspecified: Secondary | ICD-10-CM | POA: Diagnosis not present

## 2021-11-04 DIAGNOSIS — M1990 Unspecified osteoarthritis, unspecified site: Secondary | ICD-10-CM | POA: Diagnosis not present

## 2021-11-04 DIAGNOSIS — D490 Neoplasm of unspecified behavior of digestive system: Secondary | ICD-10-CM | POA: Diagnosis not present

## 2021-11-04 DIAGNOSIS — I480 Paroxysmal atrial fibrillation: Secondary | ICD-10-CM | POA: Diagnosis not present

## 2021-11-04 DIAGNOSIS — K625 Hemorrhage of anus and rectum: Secondary | ICD-10-CM | POA: Diagnosis not present

## 2021-11-04 DIAGNOSIS — E1169 Type 2 diabetes mellitus with other specified complication: Secondary | ICD-10-CM | POA: Diagnosis not present

## 2021-11-04 DIAGNOSIS — K219 Gastro-esophageal reflux disease without esophagitis: Secondary | ICD-10-CM | POA: Diagnosis not present

## 2021-11-04 DIAGNOSIS — Z23 Encounter for immunization: Secondary | ICD-10-CM | POA: Diagnosis not present

## 2021-11-08 NOTE — Progress Notes (Signed)
Cardiology Office Note  Date: 11/09/2021   ID: Caylan, Chenard Oct 12, 1946, MRN [id number]  PCP:  Celene Squibb, MD  Cardiologist:  Rozann Lesches, MD Electrophysiologist:  None   Chief Complaint: Hospital follow-up  History of Present Illness: Martha Torres is a 75 y.o. female with a history of HTN, carotid artery stenosis, atrial fibrillation with RVR, DVT, cerebrovascular disease, HLD, GERD, tobacco abuse, rectal bleeding, chest pain, chronic low back pain.   She was last seen by Dr. Domenic Polite on 05/20/2021 for a follow-up visit.  She had recently had visited [facility] emergency room for evaluation of chest pain and palpitations.  Symptoms were reminiscent of previous atrial fibrillation.  Troponins were negative.  Chest x-ray showed no acute findings.  EKG sinus rhythm with occasional PVCs.  She was discharged for outpatient follow-up.  CHA2DS2-VASc score of 5.  Dr. Domenic Polite noted she had a recent breakthrough episode of atrial fibrillation.  She was in sinus rhythm on the day of the visit.  Plan was to continue AV nodal blocker regimen and hold off on uptitrating atenolol given resting heart rate in low 60s.  She was continuing Xarelto for stroke prophylaxis.  She had thrombophilia on Xarelto for CVA prophylaxis.  Recent lab work was reviewed.  She had no spontaneous bleeding problems.  Blood pressure was trending up.  Cozaar increased to 50 mg p.o. twice daily and otherwise continuing current doses of chlorthalidone and atenolol.  Plan was to check blood pressure at nursing visit in the next few weeks.  Her carotid artery disease was followed by Dr. Scot Dock.  Recent carotid Doppler showed patent R ICA stent with moderate LICA stenosis.  She remains on Crestor.   She presented to [facility] ED on 10/25/2021 with complaints of feeling drained.  She had been in atrial fibrillation.  She had been in atrial fibrillation for 3 to 4 days.  She did not feel she was in atrial fibrillation on the day  of presentation.  She stated she knew she was in atrial fibrillation when she had chest pain.  Heart rate was in the 140s in the emergency room.  Portable chest x-ray showed left basilar atelectasis.  Stable cardiomegaly, retrocardiac hiatal hernia.  She received initial 30 mg of oral Cardizem.  She had persistent tachycardia and was given 10 mg bolus of Cardizem x2 and was started on a Cardizem drip.  She was given 1 L bolus followed by the fluid.  She was eventually restarted on her home atenolol 50 mg p.o. twice daily.  She was eventually discharged after rate was controlled and restarted on atenolol.  She is here for hospital follow-up.  She states she has had no more issues with palpitations, shortness of breath or chest pain.  She states she can always tell when she is in atrial fibrillation because she has chest pain and feels tired.  Blood pressure today is 130/76.  Heart rate is 68 and regular.  She states when she was in the emergency room her losartan was decreased but she recently saw PCP who increased her losartan back to the dose she was taking prior.  She is complaining of some left leg redness, swelling and some pain and warmth.  She denies any bleeding from Xarelto.  She states she would like samples if she could possibly have them due to significant cost and being in the donut hole per her statement.  She denies any current anginal or exertional symptoms, orthostatic symptoms, CVA  or TIA-like symptoms, PND, orthopnea, DOE, SOB, claudication-like symptoms, DVT or PE symptoms.  No lower extremity edema.   Past Medical History:  Diagnosis Date   Carotid artery disease ([place])    Right CEA in 2002; right carotid artery stent 2021   Chest pain 2006   Stress nuclear-anteroapical and basilar inferior reversible defects; cath-normal coronary arteries and EF   Chronic low back pain    MRI 2014: Spondylolisthesis of L4-L5 with foraminal narrowing, most prominent on the right and facet arthropathy.   L4-L5 surgery planned for 04/2013 with left-sided pedicle screw fixation.   Degenerative joint disease    Depression    Diarrhea, functional    DVT (deep venous thrombosis) (HCC)    Essential hypertension    Gastroesophageal reflux    H/o stricture & hiatal hernia   History of kidney stones    Hyperlipidemia    Hypothyroidism    MRSA (methicillin resistant Staphylococcus aureus) June 2014   Paroxysmal atrial fibrillation ([place]) 04/2013   Type 2 diabetes mellitus ([place])     Past Surgical History:  Procedure Laterality Date   BIOPSY  02/26/2019   Procedure: BIOPSY;  Surgeon: Danie Binder, MD;  [facility];  Service: Endoscopy;;  random colon bx's   CAROTID ENDARTERECTOMY Right 10/25/2001   Subsequent to episode of syncope   COLONOSCOPY N/A 02/26/2019   Procedure: COLONOSCOPY;  Surgeon: Danie Binder, MD; left colon s/p biopsy, moderate diverticulosis in the rectosigmoid, sigmoid, descending, and transverse colon, external and internal hemorrhoids, no obvious source of diarrhea identified.  Random colon biopsies were benign.     LAMINECTOMY  2000   L5-S1 discectomy and laminectomy; 3 other surgical procedures subsequently performed   [place]  05-23-13   X's 5 surgeries   TRANSCAROTID ARTERY REVASCULARIZATION  Right 04/21/2020   Procedure: TRANSCAROTID ARTERY REVASCULARIZATION RIGHT;  Surgeon: Angelia Mould, MD;  Location: [facility] OR;  Service: Vascular;  Laterality: Right;   VAGINAL HYSTERECTOMY      Current Outpatient Medications  Medication Sig Dispense Refill   acetaminophen (TYLENOL) 650 MG CR tablet Take 650 mg by mouth daily as needed.     ALPRAZolam (XANAX) 0.5 MG tablet Take 0.5 mg by mouth at bedtime.     aspirin EC 81 MG tablet Take 81 mg by mouth daily.     atenolol (TENORMIN) 50 MG tablet Take 1 tablet (50 mg total) by mouth 2 (two) times daily. 180 tablet 1   chlorthalidone (HYGROTON) 25 MG tablet Take 25 mg by  mouth daily.      diltiazem (CARDIZEM) 30 MG tablet TAKE 1 TABLET BY MOUTH  TWICE DAILY AS NEEDED AT  ONSET OF RAPID HEART RATE 180 tablet 1   gabapentin (NEURONTIN) 300 MG capsule Take 300 mg by mouth every 8 (eight) hours.      glipiZIDE (GLUCOTROL XL) 5 MG 24 hr tablet Take 5 mg by mouth 2 (two) times daily.      levothyroxine (SYNTHROID, LEVOTHROID) 50 MCG tablet Take 50 mcg by mouth daily before breakfast.      losartan (COZAAR) 100 MG tablet Take 100 mg by mouth daily.     MAGNESIUM PO Take 1 tablet by mouth daily.     Multiple Vitamin (MULTIVITAMIN WITH MINERALS) TABS tablet Take 1 tablet by mouth daily.     omeprazole (PRILOSEC) 20 MG capsule Take 20 mg by mouth daily.     potassium chloride SA (KLOR-CON) 20 MEQ  tablet Take 1-2 tablets (20-40 mEq total) by mouth See admin instructions. 40 meq in the morning and 20 meq in the bedtime     rivaroxaban (XARELTO) 20 MG TABS tablet Take 1 tablet (20 mg total) by mouth daily with supper. 42 tablet 0   rosuvastatin (CRESTOR) 10 MG tablet Take 10 mg by mouth at bedtime.     VITAMIN D, CHOLECALCIFEROL, PO Take 1 capsule by mouth daily.     losartan (COZAAR) 25 MG tablet Take 1 tablet (25 mg total) by mouth daily. (Patient not taking: Reported on 11/09/2021) 30 tablet 1   No current facility-administered medications for this visit.   Allergies:  Patient has no known allergies.   Social History: The patient  reports that she quit smoking about 13 years ago. Her smoking use included cigarettes. She started smoking about 56 years ago. She has a 10.75 pack-year smoking history. She has never used smokeless tobacco. She reports that she does not drink alcohol and does not use drugs.   Family History: The patient's family history includes Cancer in her brother and sister; Cancer - Prostate in her father; Diabetes in her sister; Heart attack in her father and mother; Heart disease in her father and mother; Hyperlipidemia in her brother, father, mother,  sister, and sister; Hypertension in her brother, sister, and sister; Stroke in her mother.   ROS:  Please see the history of present illness. Otherwise, complete review of systems is positive for none.  All other systems are reviewed and negative.   Physical Exam: VS:  BP 130/76   Pulse 68   Ht 5\' 5"  (1.651 m)   Wt 212 lb (96.2 kg)   SpO2 97%   BMI 35.28 kg/m , BMI Body mass index is 35.28 kg/m.  Wt Readings from Last 3 Encounters:  11/09/21 212 lb (96.2 kg)  10/25/21 208 lb 4.8 oz (94.5 kg)  06/08/21 212 lb 3.2 oz (96.3 kg)    General: Patient appears comfortable at rest. Neck: Supple, no elevated JVP or carotid bruits, no thyromegaly. Lungs: Clear to auscultation, nonlabored breathing at rest. Cardiac: Regular rate and rhythm, no S3 or significant systolic murmur, no pericardial rub. Extremities: No pitting edema, distal pulses 2+. Skin: Warm and dry. Musculoskeletal: No kyphosis. Neuropsychiatric: Alert and oriented x3, affect grossly appropriate.  ECG:  EKG on 10/25/2021 to [facility], ED atrial fibrillation with rapid ventricular response rate of 135.  Recent Labwork: 10/25/2021: ALT 20; AST 14; Magnesium 1.8; TSH 1.343 10/26/2021: BUN 17; Creatinine, Ser 0.89; Hemoglobin 13.9; Platelets 256; Potassium 3.5; Sodium 136  No results found for: CHOL, TRIG, HDL, CHOLHDL, VLDL, LDLCALC, LDLDIRECT  Other Studies Reviewed Today:    Carotid Dopplers 02/18/2021: Patent RICA stent, 40 to 75% LICA stenosis.   Chest x-ray 04/30/2021: FINDINGS:  Cardiomegaly. Double density over the left heart. There is history  of a hiatal hernia by 2016 chest CT report. Low volume chest which  accentuates interstitial markings. There is no edema, consolidation,  effusion, or pneumothorax.  IMPRESSION:  1. No acute finding.  2. Cardiomegaly and low lung volumes.   Assessment and Plan:  1. Paroxysmal atrial fibrillation (HCC)   2. Bilateral carotid artery stenosis   3. Essential  hypertension   4. Mixed hyperlipidemia    1. Paroxysmal atrial fibrillation [facility]) Recent ED visit for rapid atrial fibrillation.  She was given oral Cardizem which did not control the rate.  She was given 2 boluses of Cardizem and started on IV Cardizem  which eventually controlled her rate.  She was transitioned to atenolol 50 mg p.o. twice daily.  We are changing Cardizem 30 mg p.o. as needed to 30 mg p.o. twice daily to see if this will help control atrial fibrillation paroxysms.  Continue atenolol 50 mg p.o. twice daily.  Continue Xarelto 20 mg p.o. daily.  2. Bilateral carotid artery stenosis Carotid Dopplers on 02/18/2021 patent R ICA stent, LICA 40 to 70% stenosis.  Denies any neurologic symptoms.  3. Essential hypertension Blood pressure well controlled today at 130/76.  Continue atenolol 50 mg p.o. twice daily.  Continue chlorthalidone 25 mg p.o. daily.  Continue losartan 100 mg p.o. daily.  4. Mixed hyperlipidemia Continue Crestor 10 mg p.o. daily.  Continue aspirin 81 mg daily.  5.  Cellulitis left leg Left lower leg with swelling and redness.  Left leg is warm.  It appears to be cellulitis.  Start Keflex 500 mg p.o. twice daily x10 days.  Follow-up with PCP  Medication Adjustments/Labs and Tests Ordered: Current medicines are reviewed at length with the patient today.  Concerns regarding medicines are outlined above.   Disposition: Follow-up with Dr. Domenic Polite or [facility] 6 months  Signed, Levell July, NP 11/09/2021 10:09 AM    [facility], [place], [place] [address] Phone: [phone]; Fax: [phone]

## 2021-11-09 ENCOUNTER — Encounter: Payer: Self-pay | Admitting: Family Medicine

## 2021-11-09 ENCOUNTER — Ambulatory Visit: Payer: Medicare Other | Admitting: Family Medicine

## 2021-11-09 ENCOUNTER — Other Ambulatory Visit: Payer: Self-pay

## 2021-11-09 VITALS — BP 130/76 | HR 68 | Ht 65.0 in | Wt 212.0 lb

## 2021-11-09 DIAGNOSIS — E782 Mixed hyperlipidemia: Secondary | ICD-10-CM

## 2021-11-09 DIAGNOSIS — I10 Essential (primary) hypertension: Secondary | ICD-10-CM

## 2021-11-09 DIAGNOSIS — I480 Paroxysmal atrial fibrillation: Secondary | ICD-10-CM

## 2021-11-09 DIAGNOSIS — I6523 Occlusion and stenosis of bilateral carotid arteries: Secondary | ICD-10-CM | POA: Diagnosis not present

## 2021-11-09 MED ORDER — CEPHALEXIN 500 MG PO CAPS: ORAL_CAPSULE | ORAL | 0 refills | Status: DC

## 2021-11-09 MED ORDER — DILTIAZEM HCL 30 MG PO TABS: 30.0000 mg | ORAL_TABLET | Freq: Two times a day (BID) | ORAL | 1 refills | Status: DC

## 2021-11-09 NOTE — Patient Instructions (Signed)
Medication Instructions:   Take Diltiazem 30 mg twice a day  Take Keflex 100 mg twice a day for 10 days.  Labwork: None   Testing/Procedures: None   Follow-Up: 6 months  Any Other Special Instructions Will Be Listed Below (If Applicable).  If you need a refill on your cardiac medications before your next appointment, please call your pharmacy.

## 2021-11-23 ENCOUNTER — Ambulatory Visit: Payer: Medicare Other | Admitting: Cardiology

## 2021-12-09 DIAGNOSIS — E1169 Type 2 diabetes mellitus with other specified complication: Secondary | ICD-10-CM | POA: Diagnosis not present

## 2021-12-09 DIAGNOSIS — E782 Mixed hyperlipidemia: Secondary | ICD-10-CM | POA: Diagnosis not present

## 2021-12-09 DIAGNOSIS — E039 Hypothyroidism, unspecified: Secondary | ICD-10-CM | POA: Diagnosis not present

## 2021-12-15 DIAGNOSIS — J302 Other seasonal allergic rhinitis: Secondary | ICD-10-CM | POA: Diagnosis not present

## 2021-12-15 DIAGNOSIS — E782 Mixed hyperlipidemia: Secondary | ICD-10-CM | POA: Diagnosis not present

## 2021-12-15 DIAGNOSIS — K219 Gastro-esophageal reflux disease without esophagitis: Secondary | ICD-10-CM | POA: Diagnosis not present

## 2021-12-15 DIAGNOSIS — E039 Hypothyroidism, unspecified: Secondary | ICD-10-CM | POA: Diagnosis not present

## 2021-12-15 DIAGNOSIS — I480 Paroxysmal atrial fibrillation: Secondary | ICD-10-CM | POA: Diagnosis not present

## 2021-12-15 DIAGNOSIS — I10 Essential (primary) hypertension: Secondary | ICD-10-CM | POA: Diagnosis not present

## 2021-12-15 DIAGNOSIS — K625 Hemorrhage of anus and rectum: Secondary | ICD-10-CM | POA: Diagnosis not present

## 2021-12-15 DIAGNOSIS — D490 Neoplasm of unspecified behavior of digestive system: Secondary | ICD-10-CM | POA: Diagnosis not present

## 2021-12-15 DIAGNOSIS — I6521 Occlusion and stenosis of right carotid artery: Secondary | ICD-10-CM | POA: Diagnosis not present

## 2021-12-15 DIAGNOSIS — E1169 Type 2 diabetes mellitus with other specified complication: Secondary | ICD-10-CM | POA: Diagnosis not present

## 2021-12-15 DIAGNOSIS — Z0001 Encounter for general adult medical examination with abnormal findings: Secondary | ICD-10-CM | POA: Diagnosis not present

## 2021-12-16 DIAGNOSIS — M170 Bilateral primary osteoarthritis of knee: Secondary | ICD-10-CM | POA: Diagnosis not present

## 2021-12-22 DIAGNOSIS — J069 Acute upper respiratory infection, unspecified: Secondary | ICD-10-CM | POA: Diagnosis not present

## 2022-01-06 DIAGNOSIS — M170 Bilateral primary osteoarthritis of knee: Secondary | ICD-10-CM | POA: Diagnosis not present

## 2022-02-02 DIAGNOSIS — I6522 Occlusion and stenosis of left carotid artery: Secondary | ICD-10-CM | POA: Diagnosis not present

## 2022-02-02 DIAGNOSIS — I10 Essential (primary) hypertension: Secondary | ICD-10-CM | POA: Diagnosis not present

## 2022-02-02 DIAGNOSIS — Z7901 Long term (current) use of anticoagulants: Secondary | ICD-10-CM | POA: Diagnosis not present

## 2022-02-02 DIAGNOSIS — E119 Type 2 diabetes mellitus without complications: Secondary | ICD-10-CM | POA: Diagnosis not present

## 2022-02-02 DIAGNOSIS — Z8249 Family history of ischemic heart disease and other diseases of the circulatory system: Secondary | ICD-10-CM | POA: Diagnosis not present

## 2022-02-02 DIAGNOSIS — G8929 Other chronic pain: Secondary | ICD-10-CM | POA: Diagnosis not present

## 2022-02-02 DIAGNOSIS — J811 Chronic pulmonary edema: Secondary | ICD-10-CM | POA: Diagnosis not present

## 2022-02-02 DIAGNOSIS — I4891 Unspecified atrial fibrillation: Secondary | ICD-10-CM | POA: Diagnosis not present

## 2022-02-02 DIAGNOSIS — L03116 Cellulitis of left lower limb: Secondary | ICD-10-CM | POA: Diagnosis not present

## 2022-02-02 DIAGNOSIS — M79605 Pain in left leg: Secondary | ICD-10-CM | POA: Diagnosis not present

## 2022-02-02 DIAGNOSIS — R0789 Other chest pain: Secondary | ICD-10-CM | POA: Diagnosis not present

## 2022-02-02 DIAGNOSIS — Z79899 Other long term (current) drug therapy: Secondary | ICD-10-CM | POA: Diagnosis not present

## 2022-02-02 DIAGNOSIS — Z86718 Personal history of other venous thrombosis and embolism: Secondary | ICD-10-CM | POA: Diagnosis not present

## 2022-02-02 DIAGNOSIS — R079 Chest pain, unspecified: Secondary | ICD-10-CM | POA: Diagnosis not present

## 2022-02-02 DIAGNOSIS — L0390 Cellulitis, unspecified: Secondary | ICD-10-CM | POA: Diagnosis not present

## 2022-02-03 ENCOUNTER — Telehealth: Payer: Self-pay | Admitting: Cardiology

## 2022-02-03 ENCOUNTER — Encounter: Payer: Self-pay | Admitting: *Deleted

## 2022-02-03 DIAGNOSIS — I480 Paroxysmal atrial fibrillation: Secondary | ICD-10-CM

## 2022-02-03 DIAGNOSIS — M79662 Pain in left lower leg: Secondary | ICD-10-CM | POA: Diagnosis not present

## 2022-02-03 DIAGNOSIS — M79605 Pain in left leg: Secondary | ICD-10-CM | POA: Diagnosis not present

## 2022-02-03 NOTE — Telephone Encounter (Signed)
Patient c/o Palpitations:  High priority if patient c/o lightheadedness, shortness of breath, or chest pain  How long have you had palpitations/irregular HR/ Afib? Symptoms lasted about an hour Are you having the symptoms now? no  Are you currently experiencing lightheadedness, SOB or CP? No. Patient was SOB before she went to the ED yesterday  Do you have a history of afib (atrial fibrillation) or irregular heart rhythm? yes  Have you checked your BP or HR? (document readings if available): patient did not write them down   Are you experiencing any other symptoms? no   Patient went to the ED yesterday because she had a fast HR . She called her son and he took her to the local hospital in [place]. She had an EKG and labs done and was told she was in afib. She was also told her magnesium was low even though she takes magnesium pills every day.   She was told to contact Dr. Myles Gip office after she was discharged yesterday.

## 2022-02-03 NOTE — Telephone Encounter (Signed)
Recent [facility] ED visit for a-fib w/RVR.  EKG's requested A-Fib Clinic referral placed

## 2022-02-04 NOTE — Telephone Encounter (Signed)
Explained to patient the need for A-Fib Clinic appointment due to recent ED visit at [facility] for A-Fib.  Explained that [facility] [facility]st available was in May. Patient agreeable to keeping A-fib appointment on 02/15

## 2022-02-05 DIAGNOSIS — M170 Bilateral primary osteoarthritis of knee: Secondary | ICD-10-CM | POA: Diagnosis not present

## 2022-02-10 ENCOUNTER — Encounter (HOSPITAL_COMMUNITY): Payer: Self-pay | Admitting: Nurse Practitioner

## 2022-02-10 ENCOUNTER — Ambulatory Visit (HOSPITAL_COMMUNITY)
Admission: RE | Admit: 2022-02-10 | Discharge: 2022-02-10 | Disposition: A | Discharge status: Home / Self Care | Payer: Medicare Other | Source: Ambulatory Visit | Attending: Nurse Practitioner | Admitting: Nurse Practitioner

## 2022-02-10 ENCOUNTER — Other Ambulatory Visit: Payer: Self-pay

## 2022-02-10 ENCOUNTER — Telehealth (HOSPITAL_COMMUNITY): Payer: Self-pay | Admitting: *Deleted

## 2022-02-10 VITALS — BP 160/68 | HR 62 | Ht 65.0 in | Wt 206.6 lb

## 2022-02-10 DIAGNOSIS — I4891 Unspecified atrial fibrillation: Secondary | ICD-10-CM | POA: Diagnosis not present

## 2022-02-10 DIAGNOSIS — Z86718 Personal history of other venous thrombosis and embolism: Secondary | ICD-10-CM | POA: Insufficient documentation

## 2022-02-10 DIAGNOSIS — I779 Disorder of arteries and arterioles, unspecified: Secondary | ICD-10-CM | POA: Diagnosis not present

## 2022-02-10 DIAGNOSIS — I480 Paroxysmal atrial fibrillation: Secondary | ICD-10-CM | POA: Diagnosis not present

## 2022-02-10 DIAGNOSIS — I482 Chronic atrial fibrillation, unspecified: Secondary | ICD-10-CM | POA: Diagnosis not present

## 2022-02-10 DIAGNOSIS — I10 Essential (primary) hypertension: Secondary | ICD-10-CM | POA: Diagnosis not present

## 2022-02-10 DIAGNOSIS — Z79899 Other long term (current) drug therapy: Secondary | ICD-10-CM | POA: Insufficient documentation

## 2022-02-10 DIAGNOSIS — Z95820 Peripheral vascular angioplasty status with implants and grafts: Secondary | ICD-10-CM | POA: Insufficient documentation

## 2022-02-10 DIAGNOSIS — I739 Peripheral vascular disease, unspecified: Secondary | ICD-10-CM | POA: Diagnosis not present

## 2022-02-10 DIAGNOSIS — Z7901 Long term (current) use of anticoagulants: Secondary | ICD-10-CM | POA: Diagnosis not present

## 2022-02-10 DIAGNOSIS — Z6834 Body mass index (BMI) 34.0-34.9, adult: Secondary | ICD-10-CM | POA: Insufficient documentation

## 2022-02-10 DIAGNOSIS — D6869 Other thrombophilia: Secondary | ICD-10-CM

## 2022-02-10 NOTE — Progress Notes (Signed)
Primary Care Physician: Celene Squibb, MD Referring Physician: ER f/u Cardiologist: Dr. Buck Mam is a 76 y.o. female with a h/o HTN, PAF, DVT, Carotid disease, s/p rt side stent intervention, that is in the afib clinic for f/u ED visit in [place], 02/02/22. She was also in the ER in October as well for afib. She was referred for f/u in the afib clinic.  Today, she is in NSR. She was placed on 30 mg bid back in the fall with her atenolol 50 mg bid. When she went into afib early February, she was not aware that she could still take 30 mg as needed. She did convert int the E R.   We discussed antiarrythmic's. Multaq would be a good option but too expensive  for the Medicare population. She already hits the donut hole in June. Flecainide would also be a good option but will need a low risk stress test to use. With her carotid disease probably a good idea to have s stress test for baseline.  She is interested in this but has a lot going on and would like to schedule later in March. She is on xarelto 20 mg daily for a CHA2DS2VASc  score of 6.   Today, she denies symptoms of palpitations, chest pain, shortness of breath, orthopnea, PND, lower extremity edema, dizziness, presyncope, syncope, or neurologic sequela. The patient is tolerating medications without difficulties and is otherwise without complaint today.   Past Medical History:  Diagnosis Date   Carotid artery disease ([place])    Right CEA in 2002; right carotid artery stent 2021   Chest pain 2006   Stress nuclear-anteroapical and basilar inferior reversible defects; cath-normal coronary arteries and EF   Chronic low back pain    MRI 2014: Spondylolisthesis of L4-L5 with foraminal narrowing, most prominent on the right and facet arthropathy.  L4-L5 surgery planned for 04/2013 with left-sided pedicle screw fixation.   Degenerative joint disease    Depression    Diarrhea, functional    DVT (deep venous thrombosis) (HCC)     Essential hypertension    Gastroesophageal reflux    H/o stricture & hiatal hernia   History of kidney stones    Hyperlipidemia    Hypothyroidism    MRSA (methicillin resistant Staphylococcus aureus) June 2014   Paroxysmal atrial fibrillation ([place]) 04/2013   Type 2 diabetes mellitus ([place])    Past Surgical History:  Procedure Laterality Date   BIOPSY  02/26/2019   Procedure: BIOPSY;  Surgeon: Danie Binder, MD;  [facility];  Service: Endoscopy;;  random colon bx's   CAROTID ENDARTERECTOMY Right 10/25/2001   Subsequent to episode of syncope   COLONOSCOPY N/A 02/26/2019   Procedure: COLONOSCOPY;  Surgeon: Danie Binder, MD; left colon s/p biopsy, moderate diverticulosis in the rectosigmoid, sigmoid, descending, and transverse colon, external and internal hemorrhoids, no obvious source of diarrhea identified.  Random colon biopsies were benign.     LAMINECTOMY  2000   L5-S1 discectomy and laminectomy; 3 other surgical procedures subsequently performed   [place]  05-23-13   X's 5 surgeries   TRANSCAROTID ARTERY REVASCULARIZATION  Right 04/21/2020   Procedure: TRANSCAROTID ARTERY REVASCULARIZATION RIGHT;  Surgeon: Angelia Mould, MD;  Location: [facility] OR;  Service: Vascular;  Laterality: Right;   VAGINAL HYSTERECTOMY      Current Outpatient Medications  Medication Sig Dispense Refill   acetaminophen (TYLENOL) 650 MG CR tablet  Take 650 mg by mouth daily as needed.     ALPRAZolam (XANAX) 0.5 MG tablet Take 0.5 mg by mouth at bedtime.     aspirin EC 81 MG tablet Take 81 mg by mouth daily.     atenolol (TENORMIN) 50 MG tablet Take 1 tablet (50 mg total) by mouth 2 (two) times daily. 180 tablet 1   cephALEXin (KEFLEX) 500 MG capsule Take 1 tablet twice a day for 10 days and then stop 20 capsule 0   chlorthalidone (HYGROTON) 25 MG tablet Take 25 mg by mouth daily.      diltiazem (CARDIZEM) 30 MG tablet Take 1 tablet (30 mg total) by mouth  2 (two) times daily. 180 tablet 1   gabapentin (NEURONTIN) 300 MG capsule Take 300 mg by mouth every 8 (eight) hours.      glipiZIDE (GLUCOTROL XL) 5 MG 24 hr tablet Take 5 mg by mouth 2 (two) times daily.      levothyroxine (SYNTHROID, LEVOTHROID) 50 MCG tablet Take 50 mcg by mouth daily before breakfast.      losartan (COZAAR) 100 MG tablet Take 100 mg by mouth daily.     losartan (COZAAR) 25 MG tablet Take 1 tablet (25 mg total) by mouth daily. (Patient not taking: Reported on 11/09/2021) 30 tablet 1   MAGNESIUM PO Take 1 tablet by mouth daily.     Multiple Vitamin (MULTIVITAMIN WITH MINERALS) TABS tablet Take 1 tablet by mouth daily.     omeprazole (PRILOSEC) 20 MG capsule Take 20 mg by mouth daily.     potassium chloride SA (KLOR-CON) 20 MEQ tablet Take 1-2 tablets (20-40 mEq total) by mouth See admin instructions. 40 meq in the morning and 20 meq in the bedtime     rivaroxaban (XARELTO) 20 MG TABS tablet Take 1 tablet (20 mg total) by mouth daily with supper. 42 tablet 0   rosuvastatin (CRESTOR) 10 MG tablet Take 10 mg by mouth at bedtime.     VITAMIN D, CHOLECALCIFEROL, PO Take 1 capsule by mouth daily.     No current facility-administered medications for this encounter.    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 [employer]  Tobacco Use   Smoking status: Former    Packs/day: 0.25    Years: 43.00    Pack years: 10.75    Types: Cigarettes    Start date: 03/10/1965    Quit date: 11/06/2008    Years since quitting: 13.2   Smokeless tobacco: Never  Vaping Use   Vaping Use: Never used  Substance and Sexual Activity   Alcohol use: Never    Alcohol/week: 1.0 standard drink    Types: 1 Standard drinks or  equivalent per week   Drug use: Never   Sexual activity: Not on file  Other Topics Concern   Not on file  Social History Narrative   Not on file   Social Determinants of Health   Financial Resource Strain: Not on file  Food Insecurity: Not on file  Transportation Needs: Not on file  Physical Activity: Not on file  Stress: Not on file  Social Connections: Not on file  Intimate Partner Violence: Not on file  Family History  Problem Relation Age of Onset   Heart attack Father        Also mother   Cancer - Prostate Father    Heart disease Father        Heart Disease before age 73   Hyperlipidemia Father    Hypertension Sister    Hyperlipidemia Sister    Cancer Sister    Stroke Mother    Heart disease Mother        Heart Disease before age 34   Hyperlipidemia Mother    Heart attack Mother    Diabetes Sister    Hyperlipidemia Sister    Hypertension Sister    Hyperlipidemia Brother    Cancer Brother        Luekemia   Hypertension Brother    Colon cancer Neg Hx     ROS- All systems are reviewed and negative except as per the HPI above  Physical Exam: There were no vitals filed for this visit. Wt Readings from Last 3 Encounters:  11/09/21 96.2 kg  10/25/21 94.5 kg  06/08/21 96.3 kg    Labs: Lab Results  Component Value Date   NA 136 10/26/2021   K 3.5 10/26/2021   CL 101 10/26/2021   CO2 26 10/26/2021   GLUCOSE 208 (H) 10/26/2021   BUN 17 10/26/2021   CREATININE 0.89 10/26/2021   CALCIUM 8.9 10/26/2021   MG 1.8 10/25/2021   Lab Results  Component Value Date   INR 1.0 04/21/2020   No results found for: CHOL, HDL, LDLCALC, TRIG   GEN- The patient is well appearing, alert and oriented x 3 today.   Head- normocephalic, atraumatic Eyes-  Sclera clear, conjunctiva pink Ears- hearing intact Oropharynx- clear Neck- supple, no JVP Lymph- no cervical lymphadenopathy Lungs- Clear to ausculation bilaterally, normal work of breathing Heart- Regular rate  and rhythm, no murmurs, rubs or gallops, PMI not laterally displaced GI- soft, NT, ND, + BS Extremities- no clubbing, cyanosis, or edema MS- no significant deformity or atrophy Skin- no rash or lesion Psych- euthymic mood, full affect Neuro- strength and sensation are intact  EKG-Normal sinus rhythm, normal ekg    Assessment and Plan:  1. Paroxysmal Afib  General education re afib No clear triggers identified, other than BMI of 34.38 She has knee issues that keep her from  exercising Continue atenolol 50 mg bid and diltiazem 30 mg bid She was reminded that she can still use an extra 30 mg Cardizem as needed to control afib  We discussed antiarrythmic's She is interested in using flecainide but I will need a low risk stress test to proceed Pt will schedule this per her wish mid March  After reviewing results, I will discuss further with her start of flecainide   2. CHA2DS2VASc  score of  6 Coninue xarelto 20 mg daily   3. HTN Elevated today Avoid salt   4. Carotid disease  Has f/u with Dr. Rachelle Hora 3/9 with repeat carotid u/s   F/u with per results of stress test F/u with Dr. Domenic Polite per schedule  Geroge Baseman. [facility] [place], [facility] [address] [place], [place] [address] [phone]

## 2022-02-10 NOTE — Telephone Encounter (Addendum)
Auth number of approved lexiscan [id number] expires 03/27/22

## 2022-02-11 ENCOUNTER — Telehealth: Payer: Self-pay | Admitting: Nurse Practitioner

## 2022-02-11 DIAGNOSIS — M170 Bilateral primary osteoarthritis of knee: Secondary | ICD-10-CM | POA: Diagnosis not present

## 2022-02-11 NOTE — Telephone Encounter (Signed)
Checking percert on the following patient for testing scheduled at [facility].     LEXISCAN    02/15/2022

## 2022-02-15 ENCOUNTER — Encounter (HOSPITAL_COMMUNITY): Payer: Medicare Other

## 2022-02-16 ENCOUNTER — Telehealth (HOSPITAL_COMMUNITY): Payer: Self-pay | Admitting: *Deleted

## 2022-02-16 NOTE — Telephone Encounter (Signed)
-----   Message from Chanda Busing sent at 02/12/2022  4:56 PM EST ----- Regarding: RE: [facility] lexiscan Patient (doing gel shots and does not want to have test this soon, will call back when she wants to reschedule)  Was scheduled for 02/15/2022   ----- Message ----- From: Juluis Mire, RN Sent: 02/10/2022   2:28 PM EST To: Chanda Busing Subject: [facility] lexiscan                                    Pt needs lexiscan myoview prefers at [facility] - orders in epic per donna carroll auth number on chart.  Thanks so much! [facility]

## 2022-02-18 ENCOUNTER — Ambulatory Visit: Payer: Medicare Other | Admitting: Vascular Surgery

## 2022-02-18 ENCOUNTER — Encounter (HOSPITAL_COMMUNITY): Payer: Medicare Other

## 2022-02-19 DIAGNOSIS — M170 Bilateral primary osteoarthritis of knee: Secondary | ICD-10-CM | POA: Diagnosis not present

## 2022-02-24 ENCOUNTER — Other Ambulatory Visit: Payer: Self-pay

## 2022-02-24 DIAGNOSIS — I6523 Occlusion and stenosis of bilateral carotid arteries: Secondary | ICD-10-CM

## 2022-03-04 ENCOUNTER — Ambulatory Visit: Payer: Medicare Other | Admitting: Vascular Surgery

## 2022-03-04 ENCOUNTER — Encounter (HOSPITAL_COMMUNITY): Payer: Medicare Other

## 2022-03-11 ENCOUNTER — Ambulatory Visit (HOSPITAL_COMMUNITY)
Admission: RE | Admit: 2022-03-11 | Discharge: 2022-03-11 | Disposition: A | Discharge status: Home / Self Care | Payer: Medicare Other | Source: Ambulatory Visit | Attending: Vascular Surgery | Admitting: Vascular Surgery

## 2022-03-11 ENCOUNTER — Ambulatory Visit: Payer: Medicare Other | Admitting: Vascular Surgery

## 2022-03-11 ENCOUNTER — Other Ambulatory Visit: Payer: Self-pay

## 2022-03-11 ENCOUNTER — Encounter: Payer: Self-pay | Admitting: Vascular Surgery

## 2022-03-11 VITALS — BP 138/81 | HR 62 | Temp 98.0°F | Resp 20 | Ht 65.0 in | Wt 204.5 lb

## 2022-03-11 DIAGNOSIS — I6523 Occlusion and stenosis of bilateral carotid arteries: Secondary | ICD-10-CM

## 2022-03-11 NOTE — Progress Notes (Signed)
? ? ?REASON FOR VISIT:  ? ?Follow-up of bilateral carotid disease and chronic venous insufficiency ? ?MEDICAL ISSUES:  ? ?BILATERAL CAROTID DISEASE: This patient underwent transcarotid stenting on the right on 04/21/2020.  The stent is widely patent.  We are following a moderate 40 to 59% left carotid stenosis.  Her duplex scan today shows no change in this moderate stenosis on the left.  She understands we would not consider left carotid endarterectomy unless the stenosis progressed to greater than 80% or she develop new symptoms.  She is on aspirin and is on a statin.  She will need a follow-up duplex in 1 year and I have ordered that test.  Given that her disease has been stable we will have her seen on the PA schedule at that time. ? ?CHRONIC VENOUS INSUFFICIENCY: Given that she is having persistent episodes of cellulitis in the left leg I think we need to further evaluate her venous disease on the left.  Her schedule is full so she would like this to wait a couple of months.  I have scheduled her for formal venous reflux testing on the left.  In addition she has not had ABIs since 2019 and as she is being considered for knee surgery I think we should get some follow-up ABIs as I cannot palpate pedal pulses.  We will obtain those when she comes back in 2 months.  We have discussed the importance of leg elevation and the proper positioning for this.  I think once her swelling improves she should at least try some knee-high compression stockings with a gradient of 15 to 20 mmHg.  She has been wearing a brace on her knee because of arthritis and has had some problems wearing the compression stockings in the past.  We can discuss this further when she comes back in 2 months. ? ? ?HPI:  ? ?Martha Torres is a pleasant 76 y.o. female who I last saw on 02/18/2021.  I have been following her with bilateral carotid disease.  She is undergone transcarotid stenting on the right side.  We have been following a moderate 40 to  59% left carotid stenosis.  When I saw her last the right carotid stent was widely patent.  On the left side she had a 40 to 59% carotid stenosis.  Of note I have also followed her with chronic venous insufficiency. ? ?Since I saw her last she denies any history of stroke, TIAs, expressive or receptive aphasia, or amaurosis fugax.  She is on aspirin and is on a statin.  She is also on Xarelto for A-fib. ? ?Her main complaint today is severe pain in both knees related to osteoarthritis.  She has "bone-on-bone."  She is being considered for knee replacement surgery.  She is also had significant problems with swelling in the left leg and recurrent cellulitis.  She had a duplex scan of the left leg done a couple of months ago which showed no evidence of DVT.  This was done at [facility].  I do not have access to that study. ? ?She has had a previous history of DVT.  Again she is on Xarelto. ? ?Past Medical History:  ?Diagnosis Date  ? Carotid artery disease ([place])   ? Right CEA in 2002; right carotid artery stent 2021  ? Chest pain 2006  ? 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 Chronic low back pain   ? MRI 2014: Spondylolisthesis of  L4-L5 with foraminal narrowing, most prominent on the right and facet arthropathy.  L4-L5 surgery planned for 04/2013 with left-sided pedicle screw fixation.  ? Degenerative joint disease   ? Depression   ? Diarrhea, functional   ? DVT (deep venous thrombosis) ([place])   ? Essential hypertension   ? Gastroesophageal reflux   ? H/o stricture & hiatal hernia  ? History of kidney stones   ? Hyperlipidemia   ? Hypothyroidism   ? MRSA (methicillin resistant Staphylococcus aureus) June 2014  ? Paroxysmal atrial fibrillation ([place]) 04/2013  ? Type 2 diabetes mellitus ([place])   ? ? ?Family History  ?Problem Relation Age of Onset  ? Heart attack Father   ?     Also mother  ? Cancer - Prostate Father   ? Heart disease Father   ?      Heart Disease before age 25  ? Hyperlipidemia Father   ? Hypertension Sister   ? Hyperlipidemia Sister   ? Cancer Sister   ? Stroke Mother   ? Heart disease Mother   ?     Heart Disease before age 23  ? Hyperlipidemia Mother   ? Heart attack Mother   ? Diabetes Sister   ? Hyperlipidemia Sister   ? Hypertension Sister   ? Hyperlipidemia Brother   ? Cancer Brother   ?     Luekemia  ? Hypertension Brother   ? Colon cancer Neg Hx   ? ? ?SOCIAL HISTORY: ?Social History  ? ?Tobacco Use  ? Smoking status: Former  ?  Packs/day: 0.25  ?  Years: 43.00  ?  Pack years: 10.75  ?  Types: Cigarettes  ?  Start date: 03/10/1965  ?  Quit date: 11/06/2008  ?  Years since quitting: 13.3  ? Smokeless tobacco: Never  ?Substance Use Topics  ? Alcohol use: Never  ?  Alcohol/week: 1.0 standard drink  ?  Types: 1 Standard drinks or equivalent per week  ? ? ?No Known Allergies ? ?Current Outpatient Medications  ?Medication Sig Dispense Refill  ? Accu-Chek Softclix Lancets lancets 1 each 3 (three) times daily.    ? acetaminophen (TYLENOL) 650 MG CR tablet Take 650 mg by mouth 2 (two) times daily.    ? albuterol (VENTOLIN HFA) 108 (90 Base) MCG/ACT inhaler Inhale 1-2 puffs into the lungs as needed.    ? ALPRAZolam (XANAX) 0.5 MG tablet Take 0.5 mg by mouth at bedtime.    ? aspirin EC 81 MG tablet Take 81 mg by mouth daily.    ? atenolol (TENORMIN) 50 MG tablet Take 1 tablet (50 mg total) by mouth 2 (two) times daily. 180 tablet 1  ? chlorthalidone (HYGROTON) 25 MG tablet Take 25 mg by mouth daily.     ? clindamycin (CLEOCIN) 150 MG capsule Take 150 mg by mouth 4 (four) times daily.    ? diltiazem (CARDIZEM) 30 MG tablet Take 1 tablet (30 mg total) by mouth 2 (two) times daily. 180 tablet 1  ? gabapentin (NEURONTIN) 300 MG capsule Take 300 mg by mouth 2 (two) times daily.    ? glipiZIDE (GLUCOTROL XL) 5 MG 24 hr tablet Take 5 mg by mouth 2 (two) times daily.     ? levothyroxine (SYNTHROID, LEVOTHROID) 50 MCG tablet Take 50 mcg by mouth daily  before breakfast.     ? losartan (COZAAR) 100 MG tablet Take 100 mg by mouth daily.    ? MAGNESIUM PO Take 250 mg by mouth daily.    ?  Multiple Vitamin (MULTIVITAMIN WITH MINERALS) TABS tablet Take 1 tablet by mouth daily.    ? omeprazole (PRILOSEC) 20 MG capsule Take 20 mg by mouth daily.    ? potassium chloride SA (KLOR-CON) 20 MEQ tablet Take 1-2 tablets (20-40 mEq total) by mouth See admin instructions. 40 meq in the morning and 20 meq in the bedtime    ? rivaroxaban (XARELTO) 20 MG TABS tablet Take 1 tablet (20 mg total) by mouth daily with supper. 42 tablet 0  ? rosuvastatin (CRESTOR) 10 MG tablet Take 10 mg by mouth at bedtime.    ? VITAMIN D, CHOLECALCIFEROL, PO Take 1 capsule by mouth daily.    ? ?No current facility-administered medications for this visit.  ? ? ?REVIEW OF SYSTEMS:  ?'[X]'$  denotes positive finding, '[ ]'$  denotes negative finding ?Cardiac  Comments:  ?Chest pain or chest pressure:    ?Shortness of breath upon exertion: x   ?Short of breath when lying flat:    ?Irregular heart rhythm: x   ?    ?Vascular    ?Pain in calf, thigh, or hip brought on by ambulation: x   ?Pain in feet at night that wakes you up from your sleep:     ?Blood clot in your veins:    ?Leg swelling:  x   ?    ?Pulmonary    ?Oxygen at home:    ?Productive cough:     ?Wheezing:     ?    ?Neurologic    ?Sudden weakness in arms or legs:     ?Sudden numbness in arms or legs:     ?Sudden onset of difficulty speaking or slurred speech:    ?Temporary loss of vision in one eye:     ?Problems with dizziness:     ?    ?Gastrointestinal    ?Blood in stool:     ?Vomited blood:     ?    ?Genitourinary    ?Burning when urinating:     ?Blood in urine:    ?    ?Psychiatric    ?Major depression:     ?    ?Hematologic    ?Bleeding problems:    ?Problems with blood clotting too easily:    ?    ?Skin    ?Rashes or ulcers:    ?    ?Constitutional    ?Fever or chills:    ? ?PHYSICAL EXAM:  ? ?Vitals:  ? 03/11/22 0926 03/11/22 0929  ?BP: 120/84  138/81  ?Pulse: 62   ?Resp: 20   ?Temp: 98 ?F (36.7 ?C)   ?SpO2: 97%   ?Weight: 204 lb 8 oz (92.8 kg)   ?Height: '5\' 5"'$  (1.651 m)   ? ? ?GENERAL: The patient is a well-nourished female, in no acute distress.

## 2022-03-15 ENCOUNTER — Other Ambulatory Visit: Payer: Self-pay | Admitting: *Deleted

## 2022-03-15 DIAGNOSIS — Z48812 Encounter for surgical aftercare following surgery on the circulatory system: Secondary | ICD-10-CM

## 2022-03-31 ENCOUNTER — Telehealth: Payer: Self-pay | Admitting: Cardiology

## 2022-03-31 MED ORDER — DILTIAZEM HCL 30 MG PO TABS: 30.0000 mg | ORAL_TABLET | Freq: Two times a day (BID) | ORAL | 1 refills | Status: DC

## 2022-03-31 NOTE — Telephone Encounter (Signed)
?*  STAT* If patient is at the pharmacy, call can be transferred to refill team. ? ? ?1. Which medications need to be refilled? (please list name of each medication and dose if known) diltiazem (CARDIZEM) 30 MG tablet ? ?2. Which pharmacy/location (including street and city if local pharmacy) is medication to be sent to? [facility] ([place], [place] ? ?3. Do they need a 30 day or 90 day supply? 90 day  ?

## 2022-04-02 DIAGNOSIS — M170 Bilateral primary osteoarthritis of knee: Secondary | ICD-10-CM | POA: Diagnosis not present

## 2022-04-06 ENCOUNTER — Telehealth: Payer: Self-pay | Admitting: Cardiology

## 2022-04-06 NOTE — Telephone Encounter (Signed)
? ?  Pre-operative Risk Assessment  ?  ?Patient Name: Martha Torres  ?DOB: 04/15/1946 ?MRN: [id number]  ? ?  ? ?Request for Surgical Clearance   ? ?Procedure:   RT TOTAL KNEE ARTHROPLASTY ? ?Date of Surgery:  Clearance 08/23/22                              ?   ?Surgeon:  Dr. Pilar Plate Aluisio ?Surgeon's Group or Practice Name:  [facility] ?Phone number:  [phone]  ?Fax number:  [phone] ?  ?Type of Clearance Requested:   ?- Medical  ?- Pharmacy:  Hold Rivaroxaban (Xarelto) ASA instructions ?  ?Type of Anesthesia:   choice ?  ?Additional requests/questions:  Please advise surgeon/provider what medications should be held. ?Please fax a copy of most recent office notes, testing/labs/EKG to the surgeon's office. ? ?Signed, ?Desma Paganini   ?04/06/2022, 3:21 PM  ? ?

## 2022-04-06 NOTE — Telephone Encounter (Signed)
Primary Cardiologist:Samuel Domenic Polite, MD ? ?Chart reviewed as part of pre-operative protocol coverage. Because of Retha Bither Dallman's past medical history and time since last visit, he/she will require a follow-up visit in order to better assess preoperative cardiovascular risk. ? ?Patient has an upcoming appointment with Dr. Domenic Polite on 05/10/22. Clearance request will be routed to Dr. Domenic Polite for his awareness to address at the visit. ? ? ?Emmaline Life, NP-C ? ?  ?04/06/2022, 4:28 PM ?[place] ?[address] ?Office [phone] Fax [phone] ? ?

## 2022-04-07 DIAGNOSIS — E1169 Type 2 diabetes mellitus with other specified complication: Secondary | ICD-10-CM | POA: Diagnosis not present

## 2022-04-07 DIAGNOSIS — E782 Mixed hyperlipidemia: Secondary | ICD-10-CM | POA: Diagnosis not present

## 2022-04-07 DIAGNOSIS — E039 Hypothyroidism, unspecified: Secondary | ICD-10-CM | POA: Diagnosis not present

## 2022-04-12 DIAGNOSIS — K219 Gastro-esophageal reflux disease without esophagitis: Secondary | ICD-10-CM | POA: Diagnosis not present

## 2022-04-12 DIAGNOSIS — E1169 Type 2 diabetes mellitus with other specified complication: Secondary | ICD-10-CM | POA: Diagnosis not present

## 2022-04-12 DIAGNOSIS — I6521 Occlusion and stenosis of right carotid artery: Secondary | ICD-10-CM | POA: Diagnosis not present

## 2022-04-12 DIAGNOSIS — I480 Paroxysmal atrial fibrillation: Secondary | ICD-10-CM | POA: Diagnosis not present

## 2022-04-12 DIAGNOSIS — K625 Hemorrhage of anus and rectum: Secondary | ICD-10-CM | POA: Diagnosis not present

## 2022-04-12 DIAGNOSIS — E782 Mixed hyperlipidemia: Secondary | ICD-10-CM | POA: Diagnosis not present

## 2022-04-12 DIAGNOSIS — E039 Hypothyroidism, unspecified: Secondary | ICD-10-CM | POA: Diagnosis not present

## 2022-04-12 DIAGNOSIS — I10 Essential (primary) hypertension: Secondary | ICD-10-CM | POA: Diagnosis not present

## 2022-04-12 DIAGNOSIS — D490 Neoplasm of unspecified behavior of digestive system: Secondary | ICD-10-CM | POA: Diagnosis not present

## 2022-04-12 DIAGNOSIS — J302 Other seasonal allergic rhinitis: Secondary | ICD-10-CM | POA: Diagnosis not present

## 2022-04-21 DIAGNOSIS — M25561 Pain in right knee: Secondary | ICD-10-CM | POA: Diagnosis not present

## 2022-04-21 DIAGNOSIS — M25562 Pain in left knee: Secondary | ICD-10-CM | POA: Diagnosis not present

## 2022-05-09 ENCOUNTER — Encounter: Payer: Self-pay | Admitting: Cardiology

## 2022-05-09 NOTE — Progress Notes (Signed)
? ? ?Cardiology Office Note ? ?Date: 05/10/2022  ? ?ID: Martha Torres, DOB 1946/07/09, MRN [id number] ? ?PCP:  Celene Squibb, MD  ?Cardiologist:  Rozann Lesches, MD ?Electrophysiologist:  None  ? ?Chief Complaint  ?Patient presents with  ? Cardiac follow-up  ? ? ?History of Present Illness: ?Martha Torres is a 76 y.o. female last seen in February in the atrial fibrillation clinic, I reviewed the note.  She reports intermittent sense of palpitations, is on combination of atenolol and short acting diltiazem at this point along with Xarelto for stroke prophylaxis.  CHA2DS2-VASc score is 6.  There was discussion at her last atrial fibrillation clinic visit regarding initiation of flecainide for rhythm suppression.  Lexiscan Myoview was ordered but has not yet been completed.  We discussed this again today. ? ?She is being considered for right total knee arthroplasty by Dr. Wynelle Link, scheduled in August.  She would need to come off aspirin and Xarelto temporarily. ? ?She does not report any angina symptoms, stable NYHA class II dyspnea. ? ?Also continues to follow with Dr. Scot Dock with history of right carotid stenting in 2021, this is her indication for aspirin.  She is also on Crestor. ? ?Past Medical History:  ?Diagnosis Date  ? Carotid artery disease ([place])   ? Right CEA in 2002; right carotid artery stent 2021  ? Chronic low back pain   ? MRI 2014: Spondylolisthesis of L4-L5 with foraminal narrowing, most prominent on the right and facet arthropathy.  L4-L5 surgery planned for 04/2013 with left-sided pedicle screw fixation.  ? Degenerative joint disease   ? Depression   ? Diarrhea, functional   ? DVT (deep venous thrombosis) ([place])   ? Essential hypertension   ? Gastroesophageal reflux   ? H/o stricture & hiatal hernia  ? History of cardiac catheterization   ? Normal coronaries 2006  ? History of kidney stones   ? Hyperlipidemia   ? Hypothyroidism   ? MRSA (methicillin resistant Staphylococcus aureus) 05/2013  ?  Paroxysmal atrial fibrillation ([place]) 04/2013  ? Type 2 diabetes mellitus ([place])   ? ? ?Past Surgical History:  ?Procedure Laterality Date  ? BIOPSY  02/26/2019  ? Procedure: BIOPSY;  Surgeon: Danie Binder, MD;  [facility];  Service: Endoscopy;;  random colon bx's  ? CAROTID ENDARTERECTOMY Right 10/25/2001  ? Subsequent to episode of syncope  ? COLONOSCOPY N/A 02/26/2019  ? Procedure: COLONOSCOPY;  Surgeon: Danie Binder, MD; left colon s/p biopsy, moderate diverticulosis in the rectosigmoid, sigmoid, descending, and transverse colon, external and internal hemorrhoids, no obvious source of diarrhea identified.  Random colon biopsies were benign.    ? LAMINECTOMY  2000  ? L5-S1 discectomy and laminectomy; 3 other surgical procedures subsequently performed  ? LAPAROSCOPIC CHOLECYSTECTOMY    ? SPINE SURGERY  05-23-13  ? X's 5 surgeries  ? TRANSCAROTID ARTERY REVASCULARIZATION?  Right 04/21/2020  ? Procedure: TRANSCAROTID ARTERY REVASCULARIZATION RIGHT;  Surgeon: Angelia Mould, MD;  Location: [facility];  Service: Vascular;  Laterality: Right;  ? VAGINAL HYSTERECTOMY    ? ? ?Current Outpatient Medications  ?Medication Sig Dispense Refill  ? Accu-Chek Softclix Lancets lancets 1 each 3 (three) times daily.    ? acetaminophen (TYLENOL) 650 MG CR tablet Take 650 mg by mouth 2 (two) times daily.    ? albuterol (VENTOLIN HFA) 108 (90 Base) MCG/ACT inhaler Inhale 1-2 puffs into the lungs as needed.    ? ALPRAZolam (XANAX) 0.5 MG tablet Take 0.5  mg by mouth at bedtime.    ? aspirin EC 81 MG tablet Take 81 mg by mouth daily.    ? atenolol (TENORMIN) 50 MG tablet Take 1 tablet (50 mg total) by mouth 2 (two) times daily. 180 tablet 1  ? chlorthalidone (HYGROTON) 25 MG tablet Take 25 mg by mouth daily.     ? diltiazem (CARDIZEM) 30 MG tablet Take 1 tablet (30 mg total) by mouth 2 (two) times daily. 180 tablet 1  ? gabapentin (NEURONTIN) 300 MG capsule Take 300 mg by mouth 2 (two) times daily.    ? glipiZIDE (GLUCOTROL  XL) 5 MG 24 hr tablet Take 5 mg by mouth 2 (two) times daily.     ? levothyroxine (SYNTHROID, LEVOTHROID) 50 MCG tablet Take 50 mcg by mouth daily before breakfast.     ? losartan (COZAAR) 100 MG tablet Take 100 mg by mouth daily.    ? MAGNESIUM PO Take 250 mg by mouth daily.    ? omeprazole (PRILOSEC) 20 MG capsule Take 20 mg by mouth daily.    ? potassium chloride SA (KLOR-CON) 20 MEQ tablet Take 1-2 tablets (20-40 mEq total) by mouth See admin instructions. 40 meq in the morning and 20 meq in the bedtime    ? rivaroxaban (XARELTO) 20 MG TABS tablet Take 1 tablet (20 mg total) by mouth daily with supper. 42 tablet 0  ? rosuvastatin (CRESTOR) 10 MG tablet Take 10 mg by mouth at bedtime.    ? clindamycin (CLEOCIN) 150 MG capsule Take 150 mg by mouth 4 (four) times daily. (Patient not taking: Reported on 05/10/2022)    ? Multiple Vitamin (MULTIVITAMIN WITH MINERALS) TABS tablet Take 1 tablet by mouth daily. (Patient not taking: Reported on 05/10/2022)    ? VITAMIN D, CHOLECALCIFEROL, PO Take 1 capsule by mouth daily. (Patient not taking: Reported on 05/10/2022)    ? ?No current facility-administered medications for this visit.  ? ?Allergies:  Patient has no known allergies.  ? ?ROS: Chronic right knee pain, using a brace.  No dizziness or syncope. ? ?Physical Exam: ?VS:  BP (!) 144/70 (BP Location: Left Arm, Patient Position: Sitting, Cuff Size: Large)   Pulse 65   Ht '5\' 5"'$  (1.651 m)   Wt 198 lb (89.8 kg)   SpO2 95%   BMI 32.95 kg/m? , BMI Body mass index is 32.95 kg/m?. ? ?Wt Readings from Last 3 Encounters:  ?05/10/22 198 lb (89.8 kg)  ?03/11/22 204 lb 8 oz (92.8 kg)  ?02/10/22 206 lb 9.6 oz (93.7 kg)  ?  ?General: Patient appears comfortable at rest. ?HEENT: Conjunctiva and lids normal. ?Neck: Supple, no elevated JVP or carotid bruits, no thyromegaly. ?Lungs: Clear to auscultation, nonlabored breathing at rest. ?Cardiac: Regular rate and rhythm, no S3 or significant systolic murmur, no pericardial  rub. ?Extremities: No pitting edema.  Brace on right knee. ? ?ECG:  An ECG dated 02/10/2022 was personally reviewed today and demonstrated:  Sinus rhythm. ? ?Recent Labwork: ?10/25/2021: ALT 20; AST 14; Magnesium 1.8; TSH 1.343 ?10/26/2021: BUN 17; Creatinine, Ser 0.89; Hemoglobin 13.9; Platelets 256; Potassium 3.5; Sodium 136  ? ?Other Studies Reviewed Today: ? ?Carotid Dopplers 03/11/2022: ?Summary:  ?Right Carotid: There is no evidence of stenosis in the right ICA.  ? ?Left Carotid: Velocities in the left ICA are consistent with a 40-59%  ?stenosis.  ? ?Vertebrals:  Bilateral vertebral arteries demonstrate antegrade flow.  ?Subclavians: Normal flow hemodynamics were seen in bilateral subclavian  ?  arteries.  ?Assessment and Plan: ? ?1.  Paroxysmal atrial fibrillation with CHA2DS2-VASc score of 6.  She reports intermittent palpitations on combination of atenolol and short acting diltiazem.  Continues on Xarelto for stroke prophylaxis.  Plan to proceed with [facility] as discussed previously in the atrial fibrillation clinic, mainly to exclude significant ischemic heart disease in anticipation of using flecainide for rhythm suppression. ? ?2.  Preoperative cardiac evaluation in a 76 year old woman with paroxysmal atrial fibrillation, carotid artery disease, hypertension, hyperlipidemia, and type 2 diabetes mellitus.  She is being considered for right total knee replacement presumably under general anesthesia with Dr. Wynelle Link in August of this year.  RCRI cardiac risk calculator is class I, 0.4% chance of major adverse cardiac event.  She should be able to hold aspirin 7 days prior to surgery, and Xarelto 48 hours prior to surgery. ? ?3.  Carotid artery disease status post right CEA in 2002 and right carotid artery stenting in 2021.  She continues to follow with Vascular surgery.  She remains on low-dose aspirin and statin therapy.  Carotid Dopplers from March are noted above.  She has moderate LICA  stenosis is being monitored for now. ? ?Medication Adjustments/Labs and Tests Ordered: ?Current medicines are reviewed at length with the patient today.  Concerns regarding medicines are outlined above.  ? ?Tests Ordered: ?Or

## 2022-05-10 ENCOUNTER — Ambulatory Visit: Payer: Medicare Other | Admitting: Cardiology

## 2022-05-10 ENCOUNTER — Encounter: Payer: Self-pay | Admitting: Cardiology

## 2022-05-10 ENCOUNTER — Encounter: Payer: Self-pay | Admitting: *Deleted

## 2022-05-10 VITALS — BP 144/70 | HR 65 | Ht 65.0 in | Wt 198.0 lb

## 2022-05-10 DIAGNOSIS — I6523 Occlusion and stenosis of bilateral carotid arteries: Secondary | ICD-10-CM | POA: Diagnosis not present

## 2022-05-10 DIAGNOSIS — I480 Paroxysmal atrial fibrillation: Secondary | ICD-10-CM

## 2022-05-10 DIAGNOSIS — Z01810 Encounter for preprocedural cardiovascular examination: Secondary | ICD-10-CM | POA: Diagnosis not present

## 2022-05-10 NOTE — Patient Instructions (Addendum)
Medication Instructions:  Your physician recommends that you continue on your current medications as directed. Please refer to the Current Medication list given to you today.  Labwork: none  Testing/Procedures: Your physician has requested that you have a lexiscan myoview. For further information please visit [email or web address]. Please follow instruction sheet, as given.  Follow-Up: Your physician recommends that you schedule a follow-up appointment in: 6 months  Any Other Special Instructions Will Be Listed Below (If Applicable).  If you need a refill on your cardiac medications before your next appointment, please call your pharmacy. 

## 2022-05-13 ENCOUNTER — Telehealth: Payer: Self-pay | Admitting: Cardiology

## 2022-05-13 NOTE — Telephone Encounter (Signed)
Checking percert on the following patient for testing scheduled at [facility].     LEXISCAN   06/04/2022

## 2022-05-18 ENCOUNTER — Other Ambulatory Visit: Payer: Self-pay

## 2022-05-18 DIAGNOSIS — I83899 Varicose veins of unspecified lower extremities with other complications: Secondary | ICD-10-CM

## 2022-05-18 DIAGNOSIS — I739 Peripheral vascular disease, unspecified: Secondary | ICD-10-CM

## 2022-05-26 ENCOUNTER — Ambulatory Visit: Payer: Medicare Other | Admitting: Vascular Surgery

## 2022-05-26 ENCOUNTER — Ambulatory Visit (HOSPITAL_COMMUNITY)
Admission: RE | Admit: 2022-05-26 | Discharge: 2022-05-26 | Disposition: A | Discharge status: Home / Self Care | Payer: Medicare Other | Source: Ambulatory Visit | Attending: Vascular Surgery | Admitting: Vascular Surgery

## 2022-05-26 ENCOUNTER — Ambulatory Visit (INDEPENDENT_AMBULATORY_CARE_PROVIDER_SITE_OTHER)
Admission: RE | Admit: 2022-05-26 | Discharge: 2022-05-26 | Disposition: A | Discharge status: Home / Self Care | Payer: Medicare Other | Source: Ambulatory Visit | Attending: Vascular Surgery | Admitting: Vascular Surgery

## 2022-05-26 ENCOUNTER — Encounter: Payer: Self-pay | Admitting: Vascular Surgery

## 2022-05-26 VITALS — BP 146/94 | HR 60 | Temp 98.0°F | Resp 18 | Ht 62.0 in | Wt 200.5 lb

## 2022-05-26 DIAGNOSIS — I739 Peripheral vascular disease, unspecified: Secondary | ICD-10-CM | POA: Insufficient documentation

## 2022-05-26 DIAGNOSIS — I83892 Varicose veins of left lower extremities with other complications: Secondary | ICD-10-CM

## 2022-05-26 DIAGNOSIS — I83899 Varicose veins of unspecified lower extremities with other complications: Secondary | ICD-10-CM | POA: Diagnosis not present

## 2022-05-26 DIAGNOSIS — I872 Venous insufficiency (chronic) (peripheral): Secondary | ICD-10-CM

## 2022-05-26 NOTE — Progress Notes (Signed)
REASON FOR VISIT:   Follow-up of chronic venous insufficiency  MEDICAL ISSUES:   CHRONIC VENOUS INSUFFICIENCY: This patient does have some deep venous reflux on the left involving the common femoral vein.  She also has a history of a left lower extremity DVT in the past.  We have discussed the importance of leg elevation and the proper positioning for this.  I encouraged her to wear her compression stockings which she says she has.  We have also discussed the importance of exercise.  Given the problems she is having with her right knee I recommended water aerobics for now.  Her surgery scheduled in August.  We also discussed the importance of maintaining a healthy weight and she has lost about 14 pounds she says.  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  She has an ABI of 100% on the right and I think she should have adequate circulation to heal her surgery on the right knee.  BILATERAL CAROTID DISEASE: The patient is undergone previous TCAR on the right on 04/21/2020.  We are following a 40 to 59% left carotid stenosis.  She is due for a follow-up carotid duplex scan in March 2024.  This has already been arranged.    HPI:   Martha Torres is a pleasant 76 y.o. female who I have been following with bilateral carotid disease.  She underwent transcarotid stenting on the right on 04/21/2020.  The stent is widely patent based on her recent visit on 03/11/2022.  She had a 40 to 59% left carotid stenosis.  She will need a follow-up duplex scan in March 2024 which has been arranged.  On exam she also had evidence of chronic venous insufficiency and was having recurrent episodes of cellulitis in the left leg.  She comes in today for formal venous reflux testing on the left and arterial Doppler studies.  She is scheduled for knee surgery on the right in August.  Her knee pain has significantly limited her  mobility.  Her leg swelling has been stable compared to when I saw her last.  She has been elevating her legs some.  She does have some compression stockings but does not wear them all the time.  I do not get any history of claudication or rest pain.  Past Medical History:  Diagnosis Date   Carotid artery disease ([place])    Right CEA in 2002; right carotid artery stent 2021   Chronic low back pain    MRI 2014: Spondylolisthesis of L4-L5 with foraminal narrowing, most prominent on the right and facet arthropathy.  L4-L5 surgery planned for 04/2013 with left-sided pedicle screw fixation.   Degenerative joint disease    Depression    Diarrhea, functional    DVT (deep venous thrombosis) (HCC)    Essential hypertension    Gastroesophageal reflux    H/o stricture & hiatal hernia   History of cardiac catheterization    Normal coronaries 2006   History of kidney stones    Hyperlipidemia    Hypothyroidism    MRSA (methicillin resistant Staphylococcus aureus) 05/2013   Paroxysmal atrial fibrillation ([place]) 04/2013   Type 2 diabetes mellitus ([place])     Family History  Problem Relation Age of Onset   Heart attack Father        Also mother   Cancer - Prostate Father    Heart disease Father  Heart Disease before age 26   Hyperlipidemia Father    Hypertension Sister    Hyperlipidemia Sister    Cancer Sister    Stroke Mother    Heart disease Mother        Heart Disease before age 71   Hyperlipidemia Mother    Heart attack Mother    Diabetes Sister    Hyperlipidemia Sister    Hypertension Sister    Hyperlipidemia Brother    Cancer Brother        Luekemia   Hypertension Brother    Colon cancer Neg Hx     SOCIAL HISTORY: Social History   Tobacco Use   Smoking status: Former    Packs/day: 0.25    Years: 43.00    Pack years: 10.75    Types: Cigarettes    Start date: 03/10/1965    Quit date: 11/06/2008    Years since quitting: 13.5   Smokeless tobacco: Never  Substance Use  Topics   Alcohol use: Never    Alcohol/week: 1.0 standard drink    Types: 1 Standard drinks or equivalent per week    No Known Allergies  Current Outpatient Medications  Medication Sig Dispense Refill   Accu-Chek Softclix Lancets lancets 1 each 3 (three) times daily.     acetaminophen (TYLENOL) 650 MG CR tablet Take 650 mg by mouth 2 (two) times daily.     albuterol (VENTOLIN HFA) 108 (90 Base) MCG/ACT inhaler Inhale 1-2 puffs into the lungs as needed.     ALPRAZolam (XANAX) 0.5 MG tablet Take 0.5 mg by mouth at bedtime.     aspirin EC 81 MG tablet Take 81 mg by mouth daily.     atenolol (TENORMIN) 50 MG tablet Take 1 tablet (50 mg total) by mouth 2 (two) times daily. 180 tablet 1   chlorthalidone (HYGROTON) 25 MG tablet Take 25 mg by mouth daily.      diltiazem (CARDIZEM) 30 MG tablet Take 1 tablet (30 mg total) by mouth 2 (two) times daily. 180 tablet 1   gabapentin (NEURONTIN) 300 MG capsule Take 300 mg by mouth 2 (two) times daily.     glipiZIDE (GLUCOTROL XL) 5 MG 24 hr tablet Take 5 mg by mouth 2 (two) times daily.      levothyroxine (SYNTHROID, LEVOTHROID) 50 MCG tablet Take 50 mcg by mouth daily before breakfast.      losartan (COZAAR) 100 MG tablet Take 100 mg by mouth daily.     MAGNESIUM PO Take 250 mg by mouth daily.     omeprazole (PRILOSEC) 20 MG capsule Take 20 mg by mouth daily.     potassium chloride SA (KLOR-CON) 20 MEQ tablet Take 1-2 tablets (20-40 mEq total) by mouth See admin instructions. 40 meq in the morning and 20 meq in the bedtime     rivaroxaban (XARELTO) 20 MG TABS tablet Take 1 tablet (20 mg total) by mouth daily with supper. 42 tablet 0   rosuvastatin (CRESTOR) 10 MG tablet Take 10 mg by mouth at bedtime.     VITAMIN D, CHOLECALCIFEROL, PO Take 1 capsule by mouth daily.     clindamycin (CLEOCIN) 150 MG capsule Take 150 mg by mouth 4 (four) times daily. (Patient not taking: Reported on 05/10/2022)     Multiple Vitamin (MULTIVITAMIN WITH MINERALS) TABS tablet  Take 1 tablet by mouth daily. (Patient not taking: Reported on 05/10/2022)     No current facility-administered medications for this visit.    REVIEW OF SYSTEMS:  [  X] denotes positive finding, '[ ]'$  denotes negative finding Cardiac  Comments:  Chest pain or chest pressure:    Shortness of breath upon exertion:    Short of breath when lying flat:    Irregular heart rhythm: x       Vascular    Pain in calf, thigh, or hip brought on by ambulation:    Pain in feet at night that wakes you up from your sleep:     Blood clot in your veins:    Leg swelling:  x       Pulmonary    Oxygen at home:    Productive cough:     Wheezing:         Neurologic    Sudden weakness in arms or legs:     Sudden numbness in arms or legs:     Sudden onset of difficulty speaking or slurred speech:    Temporary loss of vision in one eye:     Problems with dizziness:         Gastrointestinal    Blood in stool:     Vomited blood:         Genitourinary    Burning when urinating:     Blood in urine:        Psychiatric    Major depression:         Hematologic    Bleeding problems:    Problems with blood clotting too easily:        Skin    Rashes or ulcers:        Constitutional    Fever or chills:     PHYSICAL EXAM:   Vitals:   05/26/22 1311  BP: (!) 146/94  Pulse: 60  Resp: 18  Temp: 98 F (36.7 C)  TempSrc: Temporal  SpO2: 98%  Weight: 200 lb 8 oz (90.9 kg)  Height: '5\' 2"'$  (1.575 m)    GENERAL: The patient is a well-nourished female, in no acute distress. The vital signs are documented above. CARDIAC: There is a regular rate and rhythm.  VASCULAR: I do not detect carotid bruits. I cannot palpate pedal pulses however she has a biphasic posterior tibial signal bilaterally. She has mild bilateral lower extremity swelling. PULMONARY: There is good air exchange bilaterally without wheezing or rales. ABDOMEN: Soft and non-tender with normal pitched bowel sounds.  MUSCULOSKELETAL: There  are no major deformities or cyanosis. NEUROLOGIC: No focal weakness or paresthesias are detected. SKIN: There are no ulcers or rashes noted. PSYCHIATRIC: The patient has a normal affect.  DATA:    ARTERIAL DOPPLER STUDY: I have independently interpreted her arterial Doppler study today.  On the right side there is a triphasic posterior tibial signal and a monophasic dorsalis pedis signal.  ABIs 100%.  Toe pressures 129 mmHg.  On the left side, there is a triphasic dorsalis pedis and posterior tibial signal.  ABIs 100%.  Toe pressures 133 mmHg.  VENOUS DUPLEX: I have independently interpreted her venous duplex scan today.  This was of the left lower extremity only.  There was no evidence of DVT in the left leg.  There was deep venous reflux involving the common femoral vein.  There was no significant superficial venous reflux on the left.  Deitra Mayo [facility] [phone]

## 2022-06-04 ENCOUNTER — Ambulatory Visit (HOSPITAL_COMMUNITY)
Admission: RE | Admit: 2022-06-04 | Discharge: 2022-06-04 | Disposition: A | Discharge status: Home / Self Care | Payer: Medicare Other | Source: Ambulatory Visit | Attending: Cardiology | Admitting: Cardiology

## 2022-06-04 DIAGNOSIS — I480 Paroxysmal atrial fibrillation: Secondary | ICD-10-CM | POA: Diagnosis not present

## 2022-06-04 DIAGNOSIS — Z01810 Encounter for preprocedural cardiovascular examination: Secondary | ICD-10-CM

## 2022-06-04 LAB — NM MYOCAR MULTI W/SPECT W/WALL MOTION / EF
LV dias vol: 69 mL (ref 46–106)
LV sys vol: 17 mL
Nuc Stress EF: 75 %
Peak HR: 75 {beats}/min
RATE: 0.3
Rest HR: 57 {beats}/min
Rest Nuclear Isotope Dose: 10.6 mCi
SDS: 3
SRS: 4
SSS: 7
ST Depression (mm): 0 mm
Stress Nuclear Isotope Dose: 32.6 mCi
TID: 1.1

## 2022-06-04 MED ORDER — SODIUM CHLORIDE FLUSH 0.9 % IV SOLN
INTRAVENOUS | Status: AC
  Administered 2022-06-04: 10 mL
  Filled 2022-06-04: qty 10

## 2022-06-04 MED ORDER — TECHNETIUM TC 99M TETROFOSMIN IV KIT
30.0000 | PACK | Freq: Once | INTRAVENOUS | Status: AC | PRN
  Administered 2022-06-04: 32.6 via INTRAVENOUS

## 2022-06-04 MED ORDER — REGADENOSON 0.4 MG/5ML IV SOLN
INTRAVENOUS | Status: AC
  Administered 2022-06-04: 0.4 mg
  Filled 2022-06-04: qty 5

## 2022-06-04 MED ORDER — TECHNETIUM TC 99M TETROFOSMIN IV KIT
10.0000 | PACK | Freq: Once | INTRAVENOUS | Status: AC | PRN
  Administered 2022-06-04: 10.58 via INTRAVENOUS

## 2022-06-13 ENCOUNTER — Other Ambulatory Visit: Payer: Self-pay | Admitting: Cardiology

## 2022-06-24 DIAGNOSIS — M19012 Primary osteoarthritis, left shoulder: Secondary | ICD-10-CM | POA: Diagnosis not present

## 2022-06-24 DIAGNOSIS — M19011 Primary osteoarthritis, right shoulder: Secondary | ICD-10-CM | POA: Diagnosis not present

## 2022-07-13 DIAGNOSIS — E1169 Type 2 diabetes mellitus with other specified complication: Secondary | ICD-10-CM | POA: Diagnosis not present

## 2022-07-13 DIAGNOSIS — E039 Hypothyroidism, unspecified: Secondary | ICD-10-CM | POA: Diagnosis not present

## 2022-07-13 DIAGNOSIS — E782 Mixed hyperlipidemia: Secondary | ICD-10-CM | POA: Diagnosis not present

## 2022-07-16 DIAGNOSIS — J302 Other seasonal allergic rhinitis: Secondary | ICD-10-CM | POA: Diagnosis not present

## 2022-07-16 DIAGNOSIS — I10 Essential (primary) hypertension: Secondary | ICD-10-CM | POA: Diagnosis not present

## 2022-07-16 DIAGNOSIS — I480 Paroxysmal atrial fibrillation: Secondary | ICD-10-CM | POA: Diagnosis not present

## 2022-07-16 DIAGNOSIS — E782 Mixed hyperlipidemia: Secondary | ICD-10-CM | POA: Diagnosis not present

## 2022-07-16 DIAGNOSIS — K625 Hemorrhage of anus and rectum: Secondary | ICD-10-CM | POA: Diagnosis not present

## 2022-07-16 DIAGNOSIS — E1169 Type 2 diabetes mellitus with other specified complication: Secondary | ICD-10-CM | POA: Diagnosis not present

## 2022-07-16 DIAGNOSIS — D490 Neoplasm of unspecified behavior of digestive system: Secondary | ICD-10-CM | POA: Diagnosis not present

## 2022-07-16 DIAGNOSIS — E039 Hypothyroidism, unspecified: Secondary | ICD-10-CM | POA: Diagnosis not present

## 2022-07-16 DIAGNOSIS — I6521 Occlusion and stenosis of right carotid artery: Secondary | ICD-10-CM | POA: Diagnosis not present

## 2022-07-16 DIAGNOSIS — K219 Gastro-esophageal reflux disease without esophagitis: Secondary | ICD-10-CM | POA: Diagnosis not present

## 2022-07-22 DIAGNOSIS — M1711 Unilateral primary osteoarthritis, right knee: Secondary | ICD-10-CM | POA: Diagnosis not present

## 2022-08-03 NOTE — H&P (Signed)
TOTAL KNEE ADMISSION H&P  Patient is being admitted for right total knee arthroplasty.  Subjective:  Chief Complaint: Right knee pain.  HPI: Martha Torres, 76 y.o. female has a history of pain and functional disability in the right knee due to arthritis and has failed non-surgical conservative treatments for greater than 12 weeks to include NSAID's and/or analgesics, corticosteriod injections, viscosupplementation injections, and activity modification. Onset of symptoms was gradual, starting several years ago with gradually worsening course since that time. The patient noted no past surgery on the right knee.  Patient currently rates pain in the right knee at 8 out of 10 with activity. Patient has night pain, worsening of pain with activity and weight bearing, and pain that interferes with activities of daily living. Patient has evidence of  severe end-stage arthritis of the right knee, bone-on-bone in the medial and patellofemoral compartments with tibial subluxation and slight varus deformity  by imaging studies. There is no active infection.  Patient Active Problem List   Diagnosis Date Noted   Atrial fibrillation with RVR ([place]) 10/26/2021   Atrial fibrillation with rapid ventricular response ([place]) 10/25/2021   Rectal bleeding 05/19/2020   Carotid artery stenosis 04/21/2020   Diarrhea 12/05/2018   Abdominal pain 12/05/2018   Pain in joint, lower leg 11/06/2014   Occlusion and stenosis of carotid artery without mention of cerebral infarction 08/16/2013   Aftercare following surgery of the circulatory system, NEC 08/16/2013   Chronic low back pain    Hypertension    Hyperlipidemia    Chest pain    Tobacco abuse    Cerebrovascular disease    DVT (deep venous thrombosis) ([place]) 05/08/2013   Gastroesophageal reflux disease 05/08/2013    Past Medical History:  Diagnosis Date   Carotid artery disease ([place])    Right CEA in 2002; right carotid artery stent 2021   Chronic low back pain     MRI 2014: Spondylolisthesis of L4-L5 with foraminal narrowing, most prominent on the right and facet arthropathy.  L4-L5 surgery planned for 04/2013 with left-sided pedicle screw fixation.   Degenerative joint disease    Depression    Diarrhea, functional    DVT (deep venous thrombosis) (HCC)    Essential hypertension    Gastroesophageal reflux    H/o stricture & hiatal hernia   History of cardiac catheterization    Normal coronaries 2006   History of kidney stones    Hyperlipidemia    Hypothyroidism    MRSA (methicillin resistant Staphylococcus aureus) 05/2013   Paroxysmal atrial fibrillation ([place]) 04/2013   Type 2 diabetes mellitus ([place])     Past Surgical History:  Procedure Laterality Date   BIOPSY  02/26/2019   Procedure: BIOPSY;  Surgeon: Danie Binder, MD;  Location: [facility];  Service: Endoscopy;;  random colon bx's   CAROTID ENDARTERECTOMY Right 10/25/2001   Subsequent to episode of syncope   COLONOSCOPY N/A 02/26/2019   Procedure: COLONOSCOPY;  Surgeon: Danie Binder, MD; left colon s/p biopsy, moderate diverticulosis in the rectosigmoid, sigmoid, descending, and transverse colon, external and internal hemorrhoids, no obvious source of diarrhea identified.  Random colon biopsies were benign.     LAMINECTOMY  2000   L5-S1 discectomy and laminectomy; 3 other surgical procedures subsequently performed   [place]  05-23-13   X's 5 surgeries   TRANSCAROTID ARTERY REVASCULARIZATION  Right 04/21/2020   Procedure: TRANSCAROTID ARTERY REVASCULARIZATION RIGHT;  Surgeon: Angelia Mould, MD;  Location: [facility]  OR;  Service: Vascular;  Laterality: Right;   VAGINAL HYSTERECTOMY      Prior to Admission medications   Medication Sig Start Date End Date Taking? Authorizing Provider  Accu-Chek Softclix Lancets lancets 1 each 3 (three) times daily. 01/04/22   [provider]  acetaminophen (TYLENOL) 650 MG CR tablet Take 650 mg by  mouth 2 (two) times daily.    [provider]  albuterol (VENTOLIN HFA) 108 (90 Base) MCG/ACT inhaler Inhale 1-2 puffs into the lungs as needed. 12/22/21   [provider]  ALPRAZolam Duanne Moron) 0.5 MG tablet Take 0.5 mg by mouth at bedtime.    [provider]  aspirin EC 81 MG tablet Take 81 mg by mouth daily.    [provider]  atenolol (TENORMIN) 50 MG tablet Take 1 tablet (50 mg total) by mouth 2 (two) times daily. 09/18/18   Satira Sark, MD  chlorthalidone (HYGROTON) 25 MG tablet Take 25 mg by mouth daily.  05/07/13   [provider]  clindamycin (CLEOCIN) 150 MG capsule Take 150 mg by mouth 4 (four) times daily. Patient not taking: Reported on 05/10/2022 02/02/22   [provider]  diltiazem (CARDIZEM) 30 MG tablet TAKE 1 TABLET BY MOUTH TWICE  DAILY 06/14/22   Satira Sark, MD  gabapentin (NEURONTIN) 300 MG capsule Take 300 mg by mouth 2 (two) times daily.    [provider]  glipiZIDE (GLUCOTROL XL) 5 MG 24 hr tablet Take 5 mg by mouth 2 (two) times daily.     [provider]  levothyroxine (SYNTHROID, LEVOTHROID) 50 MCG tablet Take 50 mcg by mouth daily before breakfast.     [provider]  losartan (COZAAR) 100 MG tablet Take 100 mg by mouth daily.    [provider]  MAGNESIUM PO Take 250 mg by mouth daily.    [provider]  Multiple Vitamin (MULTIVITAMIN WITH MINERALS) TABS tablet Take 1 tablet by mouth daily. Patient not taking: Reported on 05/10/2022    [provider]  omeprazole (PRILOSEC) 20 MG capsule Take 20 mg by mouth daily.    [provider]  potassium chloride SA (KLOR-CON) 20 MEQ tablet Take 1-2 tablets (20-40 mEq total) by mouth See admin instructions. 40 meq in the morning and 20 meq in the bedtime 10/26/21   Johnson, Clanford L, MD  rivaroxaban (XARELTO) 20 MG TABS tablet Take 1 tablet (20 mg total) by mouth daily with supper. 12/15/20   Satira Sark, MD  rosuvastatin (CRESTOR) 10 MG tablet Take 10 mg by mouth at bedtime. 03/06/20   [provider]  VITAMIN D, CHOLECALCIFEROL, PO Take 1 capsule by mouth daily.    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[employer]  Tobacco Use   Smoking status: Former    Packs/day: 0.25    Years: 43.00    Total pack years: 10.75    Types: Cigarettes    Start date: 03/10/1965    Quit date: 11/06/2008    Years since quitting: 13.7   Smokeless tobacco: Never  Vaping Use   Vaping Use: Never used  Substance and Sexual Activity  Alcohol use: Never    Alcohol/week: 1.0 standard drink of alcohol    Types: 1 Standard drinks or equivalent per week   Drug use: Never   Sexual activity: Not on file  Other Topics Concern   Not on file  Social History Narrative   Not on file   Social Determinants of Health   Financial Resource Strain: Not on file  Food Insecurity: Not on file  Transportation Needs: Not on file  Physical Activity: Not on file  Stress: Not on file  Social Connections: Not on file  Intimate Partner Violence: Not on file    Tobacco Use: Medium Risk (05/26/2022)   Patient History    Smoking Tobacco Use: Former    Smokeless Tobacco Use: Never    Passive Exposure: Not on file   Social History   Substance and Sexual Activity  Alcohol Use Never   Alcohol/week: 1.0 standard drink of alcohol   Types: 1 Standard drinks or equivalent per week    Family History  Problem Relation Age of Onset   Heart attack Father        Also mother   Cancer - Prostate Father    Heart disease Father        Heart Disease before age 17   Hyperlipidemia Father    Hypertension  Sister    Hyperlipidemia Sister    Cancer Sister    Stroke Mother    Heart disease Mother        Heart Disease before age 26   Hyperlipidemia Mother    Heart attack Mother    Diabetes Sister    Hyperlipidemia Sister    Hypertension Sister    Hyperlipidemia Brother    Cancer Brother        Luekemia   Hypertension Brother    Colon cancer Neg Hx     ROS  Objective:  Physical Exam: Well nourished and well developed.  General: Alert and oriented x3, cooperative and pleasant, no acute distress.  Head: normocephalic, atraumatic, neck supple.  Eyes: EOMI.  Abdomen: non-tender to palpation and soft, normoactive bowel sounds. Musculoskeletal: Right Knee Exam:  No effusion present. No swelling present.  The range of motion is: 5 to 130 degrees.  Moderate crepitus on range of motion of the knee.  Positive medial joint line tenderness.  No lateral joint line tenderness.  The knee is stable.   Left Knee Exam:  No effusion present. No swelling present.  The Range of motion is: 0 to 135 degrees.  No crepitus on range of motion of the knee.  No medial joint line tenderness.  No lateral joint line tenderness.  Significant edema in the left lower leg.  Calves soft and nontender. Motor function intact in LE. Strength 5/5 LE bilaterally. Neuro: Distal pulses 2+. Sensation to light touch intact in LE.  Vital signs in last 24 hours: BP: ()/()  Arterial Line BP: ()/()   Imaging Review Plain radiographs demonstrate severe degenerative joint disease of the right knee. The overall alignment is mild varus. The bone quality appears to be adequate for age and reported activity level.  Assessment/Plan:  End stage arthritis, right knee   The patient history, physical examination, clinical judgment of the provider and imaging studies are consistent with end stage degenerative joint disease of the right knee and total knee arthroplasty is deemed medically necessary. The treatment options  including medical management, injection therapy arthroscopy and arthroplasty were discussed at length. The risks and benefits of total knee  arthroplasty were presented and reviewed. The risks due to aseptic loosening, infection, stiffness, patella tracking problems, thromboembolic complications and other imponderables were discussed. The patient acknowledged the explanation, agreed to proceed with the plan and consent was signed. Patient is being admitted for inpatient treatment for surgery, pain control, PT, OT, prophylactic antibiotics, VTE prophylaxis, progressive ambulation and ADLs and discharge planning. The patient is planning to be discharged  home .  Patient's anticipated LOS is less than 2 midnights, meeting these requirements: - Lives within 1 hour of care - Has a competent adult at home to recover with post-op - NO history of  - Chronic pain requiring opiods  - Coronary Artery Disease  - Heart failure  - Heart attack  - Stroke  - Respiratory Failure/COPD  - Renal failure  - Anemia  - Advanced Liver disease  Therapy Plans: [facility] [employer] Disposition: Home with Friend Planned DVT Prophylaxis: Xarelto + Aspirin (home regimen) DME Needed: RW PCP: Martha Cahill, MD (clearance received) Cardiologist: Martha Lesches, MD (clearance received) TXA: Topical (hx DVT) Allergies: NKDA Anesthesia Concerns: Nausea BMI: 36 Last HgbA1c: 7.7 about 10 months ago - will obtain with pre-op labs  Pharmacy: [facility]  Other: -Per cardiologist, hold aspirin 7 days prior to surgery, and Xarelto 2-3 days prior to surgery.  - Patient was instructed on what medications to stop prior to surgery. - Follow-up visit in 2 weeks with Dr. Wynelle Torres - Begin physical therapy following surgery - Pre-operative lab work as pre-surgical testing - Prescriptions will be provided in hospital at time of discharge  R. Jaynie Bream, PA-C Orthopedic Surgery [facility] [place]

## 2022-08-06 NOTE — Progress Notes (Addendum)
Anesthesia Review:  PCP: Zack hall Clearance 04/12/22. OV 04/12/22 on chart  Cardiologist : DR Johnny Bridge 05/10/22- preop eval  Chest x-ray : 10/25/21- 1 view  EKG : 02/10/22  Carotids- 03/11/22  Vasc- 05/26/22  Echo : Stress test: 06/04/22  Cardiac Cath :  Activity level: can do a flight of stairs without difficutly  Sleep Study/ CPAP : none  Fasting Blood Sugar :      / Checks Blood Sugar -- times a day:   Blood Thinner/ Instructions /Last Dose: ASA / Instructions/ Last Dose :  81 mg aspirin - stop 7 days prior to surgery per pt  Xarelto - stop 2 days prior to surgery per pt   DM- 2- checks glucose 1-2x daily  Hgba1c-  08/10/22-  7.3  PT called and LVMM on 08/11/22 in regards to ? Lab results.  Called pt and LVMM on 08/12/22. And [phone].  Left call back number of [phone].

## 2022-08-06 NOTE — Patient Instructions (Signed)
SURGICAL WAITING ROOM VISITATION Patients having surgery or a procedure may have no more than 2 support people in the waiting area - these visitors may rotate.   Children under the age of 5 must have an adult with them who is not the patient. If the patient needs to stay at the hospital during part of their recovery, the visitor guidelines for inpatient rooms apply. Pre-op nurse will coordinate an appropriate time for 1 support person to accompany patient in pre-op.  This support person may not rotate.    Please refer to the [facility] website for the visitor guidelines for Inpatients (after your surgery is over and you are in a regular room).      YOur procedure is scheduled on 08/23/22.     Report to [facility] Main Entrance    Report to admitting at   0700 AM   Call this number if you have problems the morning of surgery [phone]   Do not eat food :After Midnight.   After Midnight you may have the following liquids until ___ 0615___ AM  DAY OF SURGERY  Water Non-Citrus Juices (without pulp, NO RED) Carbonated Beverages Black Coffee (NO MILK/CREAM OR CREAMERS, sugar ok)  Clear Tea (NO MILK/CREAM OR CREAMERS, sugar ok) regular and decaf                             Plain Jell-O (NO RED)                                           Fruit ices (not with fruit pulp, NO RED)                                     Popsicles (NO RED)                                                               Sports drinks like Gatorade (NO RED)                      The day of surgery:  Drink ONE (1) Pre-Surgery Clear Ensure or G2 at   0615 AM ( be completed by ) the morning of surgery. Drink in one sitting. Do not sip.  This drink was given to you during your hospital  pre-op appointment visit. Nothing else to drink after completing the  Pre-Surgery Clear Ensure or G2.           Oral Hygiene is also important to reduce your risk of infection.                                     Remember - BRUSH YOUR TEETH THE MORNING OF SURGERY WITH YOUR REGULAR TOOTHPASTE   Do NOT smoke after Midnight   Take these medicines the morning of surgery with A SIP OF WATER:  inhalers as usual and bring, atenolol, cardizem, synthroid, gabapentin, omeprazole   DO NOT TAKE ANY ORAL  DIABETIC MEDICATIONS DAY OF YOUR SURGERY  Bring CPAP mask and tubing day of surgery.                              You may not have any metal on your body including hair pins, jewelry, and body piercing             Do not wear make-up, lotions, powders, perfumes/cologne, or deodorant  Do not wear nail polish including gel and S&S, artificial/acrylic nails, or any other type of covering on natural nails including finger and toenails. If you have artificial nails, gel coating, etc. that needs to be removed by a nail salon please have this removed prior to surgery or surgery may need to be canceled/ delayed if the surgeon/ anesthesia feels like they are unable to be safely monitored.   Do not shave  48 hours prior to surgery.               Men may shave face and neck.   Do not bring valuables to the hospital. [place].   Contacts, dentures or bridgework may not be worn into surgery.   Bring small overnight bag day of surgery.   DO NOT [place]. PHARMACY WILL DISPENSE MEDICATIONS LISTED ON YOUR MEDICATION LIST TO YOU DURING YOUR ADMISSION [place]!    Patients discharged on the day of surgery will not be allowed to drive home.  Someone NEEDS to stay with you for the first 24 hours after anesthesia.   Special Instructions: Bring a copy of your healthcare power of attorney and living will documents         the day of surgery if you haven't scanned them before.              Please read over the following fact sheets you were given: IF YOU HAVE QUESTIONS ABOUT YOUR PRE-OP INSTRUCTIONS PLEASE CALL [phone]     [facility]  Health - Preparing for Surgery Before surgery, you can play an important role.  Because skin is not sterile, your skin needs to be as free of germs as possible.  You can reduce the number of germs on your skin by washing with CHG (chlorahexidine gluconate) soap before surgery.  CHG is an antiseptic cleaner which kills germs and bonds with the skin to continue killing germs even after washing. Please DO NOT use if you have an allergy to CHG or antibacterial soaps.  If your skin becomes reddened/irritated stop using the CHG and inform your nurse when you arrive at Short Stay. Do not shave (including legs and underarms) for at least 48 hours prior to the first CHG shower.  You may shave your face/neck. Please follow these instructions carefully:  1.  Shower with CHG Soap the night before surgery and the  morning of Surgery.  2.  If you choose to wash your hair, wash your hair first as usual with your  normal  shampoo.  3.  After you shampoo, rinse your hair and body thoroughly to remove the  shampoo.                           4.  Use CHG as you would any other liquid soap.  You can apply chg directly  to the skin and wash                       Gently with a scrungie or clean washcloth.  5.  Apply the CHG Soap to your body ONLY FROM THE NECK DOWN.   Do not use on face/ open                           Wound or open sores. Avoid contact with eyes, ears mouth and genitals (private parts).                       Wash face,  Genitals (private parts) with your normal soap.             6.  Wash thoroughly, paying special attention to the area where your surgery  will be performed.  7.  Thoroughly rinse your body with warm water from the neck down.  8.  DO NOT shower/wash with your normal soap after using and rinsing off  the CHG Soap.                9.  Pat yourself dry with a clean towel.            10.  Wear clean pajamas.            11.  Place clean sheets on your bed the night of your first shower and do not   sleep with pets. Day of Surgery : Do not apply any lotions/deodorants the morning of surgery.  Please wear clean clothes to the hospital/surgery center.  FAILURE TO FOLLOW THESE INSTRUCTIONS MAY RESULT IN THE CANCELLATION OF YOUR SURGERY PATIENT SIGNATURE_________________________________  NURSE SIGNATURE__________________________________  ________________________________________________________________________

## 2022-08-10 ENCOUNTER — Other Ambulatory Visit: Payer: Self-pay

## 2022-08-10 ENCOUNTER — Encounter (HOSPITAL_COMMUNITY): Payer: Self-pay

## 2022-08-10 ENCOUNTER — Encounter (HOSPITAL_COMMUNITY)
Admission: RE | Admit: 2022-08-10 | Discharge: 2022-08-10 | Disposition: A | Discharge status: Home / Self Care | Payer: Medicare Other | Source: Ambulatory Visit | Attending: Orthopedic Surgery | Admitting: Orthopedic Surgery

## 2022-08-10 VITALS — BP 176/74 | HR 56 | Temp 98.0°F | Resp 16 | Ht 62.0 in | Wt 197.0 lb

## 2022-08-10 DIAGNOSIS — E119 Type 2 diabetes mellitus without complications: Secondary | ICD-10-CM | POA: Insufficient documentation

## 2022-08-10 DIAGNOSIS — Z01818 Encounter for other preprocedural examination: Secondary | ICD-10-CM

## 2022-08-10 DIAGNOSIS — Z01812 Encounter for preprocedural laboratory examination: Secondary | ICD-10-CM | POA: Diagnosis not present

## 2022-08-10 HISTORY — DX: Other specified postprocedural states: R11.2

## 2022-08-10 HISTORY — DX: Polyneuropathy, unspecified: G62.9

## 2022-08-10 HISTORY — DX: Unspecified atrial fibrillation: I48.91

## 2022-08-10 HISTORY — DX: Other specified postprocedural states: Z98.890

## 2022-08-10 LAB — BASIC METABOLIC PANEL
Anion gap: 9 (ref 5–15)
BUN: 22 mg/dL (ref 8–23)
CO2: 27 mmol/L (ref 22–32)
Calcium: 9.5 mg/dL (ref 8.9–10.3)
Chloride: 105 mmol/L (ref 98–111)
Creatinine, Ser: 0.93 mg/dL (ref 0.44–1.00)
GFR, Estimated: >60 mL/min (ref >60)
Glucose, Bld: 120 mg/dL — ABNORMAL HIGH (ref 70–99)
Potassium: 3.7 mmol/L (ref 3.5–5.1)
Sodium: 141 mmol/L (ref 135–145)

## 2022-08-10 LAB — CBC
HCT: 42.8 % (ref 36.0–46.0)
Hemoglobin: 13.5 g/dL (ref 12.0–15.0)
MCH: 28.3 pg (ref 26.0–34.0)
MCHC: 31.5 g/dL (ref 30.0–36.0)
MCV: 89.7 fL (ref 80.0–100.0)
Platelets: 270 10*3/uL (ref 150–400)
RBC: 4.77 MIL/uL (ref 3.87–5.11)
RDW: 17.6 % — ABNORMAL HIGH (ref 11.5–15.5)
WBC: 10.2 10*3/uL (ref 4.0–10.5)
nRBC: 0 % (ref 0.0–0.2)

## 2022-08-10 LAB — HEMOGLOBIN A1C
Hgb A1c MFr Bld: 7.3 % — ABNORMAL HIGH (ref 4.8–5.6)
Mean Plasma Glucose: 162.81 mg/dL

## 2022-08-10 LAB — SURGICAL PCR SCREEN
MRSA, PCR: NEGATIVE
Staphylococcus aureus: NEGATIVE

## 2022-08-10 LAB — GLUCOSE, CAPILLARY: Glucose-Capillary: 125 mg/dL — ABNORMAL HIGH (ref 70–99)

## 2022-08-13 NOTE — Progress Notes (Signed)
Anesthesia Chart Review   Case: [id number] Date/Time: 08/23/22 0910   Procedure: TOTAL KNEE ARTHROPLASTY (Right: Knee)   Anesthesia type: Choice   Pre-op diagnosis: right knee osteoarthritis   Location: [facility] ROOM 09 / [facility]   Surgeons: Gaynelle Arabian, MD       DISCUSSION:76 y.o. former smoker with h/o PONV, HTN, a-fib (Xarelto), carotid artery disease s/p TCAR on the right on 04/21/2020, chronic venous insufficiency, DVT, DM II, right knee OA scheduled for above procedure 08/23/2022 with Dr. Gaynelle Arabian.   Per cardiology preoperative evaluation, 'Preoperative cardiac evaluation in a 76 year old woman with paroxysmal atrial fibrillation, carotid artery disease, hypertension, hyperlipidemia, and type 2 diabetes mellitus.  She is being considered for right total knee replacement presumably under general anesthesia with Dr. Wynelle Link in August of this year.  RCRI cardiac risk calculator is class I, 0.4% chance of major adverse cardiac event.  She should be able to hold aspirin 7 days prior to surgery, and Xarelto 48 hours prior to surgery."  Discussed with Dr. Anne Fu office, pt will need to hold Xarelto 72 hours for spinal to be considered.   VS: BP (!) 176/74   Pulse (!) 56   Temp 36.7 C (Oral)   Resp 16   Ht '5\' 2"'$  (1.575 m)   Wt 89.4 kg   SpO2 100%   BMI 36.03 kg/m   PROVIDERS: Celene Squibb, MD is PCP   Cardiologist : Dr. Johnny Bridge LABS: Labs reviewed: Acceptable for surgery. (all labs ordered are listed, but only abnormal results are displayed)  Labs Reviewed  CBC - Abnormal; Notable for the following components:      Result Value   RDW 17.6 (*)    All other components within normal limits  HEMOGLOBIN A1C - Abnormal; Notable for the following components:   Hgb A1c MFr Bld 7.3 (*)    All other components within normal limits  BASIC METABOLIC PANEL - Abnormal; Notable for the following components:   Glucose, Bld 120 (*)    All other components within normal limits   GLUCOSE, CAPILLARY - Abnormal; Notable for the following components:   Glucose-Capillary 125 (*)    All other components within normal limits  SURGICAL PCR SCREEN     IMAGES: US Carotid 03/11/2022 Summary:  Right Carotid: There is no evidence of stenosis in the right ICA.   Left Carotid: Velocities in the left ICA are consistent with a 40-59%  stenosis.   EKG:   CV:  Past Medical History:  Diagnosis Date   A-fib [facility])    Carotid artery disease ([place])    Right CEA in 2002; right carotid artery stent 2021   Chronic low back pain    MRI 2014: Spondylolisthesis of L4-L5 with foraminal narrowing, most prominent on the right and facet arthropathy.  L4-L5 surgery planned for 04/2013 with left-sided pedicle screw fixation.   Degenerative joint disease    Depression    Diarrhea, functional    DVT (deep venous thrombosis) ([facility])    Essential hypertension    Gastroesophageal reflux    H/o stricture & hiatal hernia   History of cardiac catheterization    Normal coronaries 2006   History of kidney stones    Hyperlipidemia    Hypothyroidism    MRSA (methicillin resistant Staphylococcus aureus) 05/2013   Neuropathy    right leg   Paroxysmal atrial fibrillation ([place]) 04/2013   PONV (postoperative nausea and vomiting)    Type 2 diabetes mellitus ([place])  Past Surgical History:  Procedure Laterality Date   BIOPSY  02/26/2019   Procedure: BIOPSY;  Surgeon: Danie Binder, MD;  [facility];  Service: Endoscopy;;  random colon bx's   CAROTID ENDARTERECTOMY Right 10/25/2001   Subsequent to episode of syncope   COLONOSCOPY N/A 02/26/2019   Procedure: COLONOSCOPY;  Surgeon: Danie Binder, MD; left colon s/p biopsy, moderate diverticulosis in the rectosigmoid, sigmoid, descending, and transverse colon, external and internal hemorrhoids, no obvious source of diarrhea identified.  Random colon biopsies were benign.     LAMINECTOMY  2000   L5-S1 discectomy and laminectomy; 3  other surgical procedures subsequently performed   [place]  05-23-13   X's 5 surgeries   TRANSCAROTID ARTERY REVASCULARIZATION  Right 04/21/2020   Procedure: TRANSCAROTID ARTERY REVASCULARIZATION RIGHT;  Surgeon: Angelia Mould, MD;  Location: [facility];  Service: Vascular;  Laterality: Right;   VAGINAL HYSTERECTOMY      MEDICATIONS:  Accu-Chek Softclix Lancets lancets   acetaminophen (TYLENOL) 650 MG CR tablet   albuterol (VENTOLIN HFA) 108 (90 Base) MCG/ACT inhaler   ALPRAZolam (XANAX) 0.5 MG tablet   aspirin EC 81 MG tablet   atenolol (TENORMIN) 50 MG tablet   chlorthalidone (HYGROTON) 25 MG tablet   diltiazem (CARDIZEM) 30 MG tablet   gabapentin (NEURONTIN) 300 MG capsule   glipiZIDE (GLUCOTROL XL) 5 MG 24 hr tablet   levothyroxine (SYNTHROID, LEVOTHROID) 50 MCG tablet   losartan (COZAAR) 100 MG tablet   MAGNESIUM PO   omeprazole (PRILOSEC) 20 MG capsule   potassium chloride SA (KLOR-CON) 20 MEQ tablet   rivaroxaban (XARELTO) 20 MG TABS tablet   rosuvastatin (CRESTOR) 10 MG tablet   No current facility-administered medications for this encounter.     Konrad Felix Ward, PA-C [facility]Surgical [facility] [phone]

## 2022-08-22 NOTE — Anesthesia Preprocedure Evaluation (Signed)
Anesthesia Evaluation  Patient identified by MRN, date of birth, ID band Patient awake    Reviewed: Allergy & Precautions, H&P , NPO status , Patient's Chart, lab work & pertinent test results  History of Anesthesia Complications (+) PONV and history of anesthetic complications  Airway Mallampati: III  TM Distance: >3 FB Neck ROM: Full    Dental no notable dental hx. (+) Teeth Intact, Dental Advisory Given   Pulmonary neg pulmonary ROS, former smoker,    Pulmonary exam normal breath sounds clear to auscultation       Cardiovascular Exercise Tolerance: Good hypertension, Pt. on medications + dysrhythmias Atrial Fibrillation  Rhythm:Regular Rate:Normal     Neuro/Psych Depression negative neurological ROS     GI/Hepatic Neg liver ROS, GERD  Medicated,  Endo/Other  diabetes, Type 2, Oral Hypoglycemic AgentsHypothyroidism   Renal/GU negative Renal ROS  negative genitourinary   Musculoskeletal  (+) Arthritis , Osteoarthritis,    Abdominal   Peds  Hematology negative hematology ROS (+)   Anesthesia Other Findings   Reproductive/Obstetrics negative OB ROS                            Anesthesia Physical Anesthesia Plan  ASA: 3  Anesthesia Plan: General   Post-op Pain Management: Regional block* and Ofirmev IV (intra-op)*   Induction: Intravenous  PONV Risk Score and Plan: 4 or greater and Ondansetron, Dexamethasone, Propofol infusion and Treatment may vary due to age or medical condition  Airway Management Planned: LMA  Additional Equipment:   Intra-op Plan:   Post-operative Plan: Extubation in OR  Informed Consent: I have reviewed the patients History and Physical, chart, labs and discussed the procedure including the risks, benefits and alternatives for the proposed anesthesia with the patient or authorized representative who has indicated his/her understanding and acceptance.      Dental advisory given  Plan Discussed with: CRNA  Anesthesia Plan Comments:        Anesthesia Quick Evaluation

## 2022-08-23 ENCOUNTER — Encounter (HOSPITAL_COMMUNITY): Payer: Self-pay | Admitting: Orthopedic Surgery

## 2022-08-23 ENCOUNTER — Encounter (HOSPITAL_COMMUNITY)
Admission: RE | Disposition: A | Discharge status: Home / Self Care | Payer: Self-pay | Source: Home / Self Care | Attending: Orthopedic Surgery

## 2022-08-23 ENCOUNTER — Other Ambulatory Visit: Payer: Self-pay

## 2022-08-23 ENCOUNTER — Ambulatory Visit (HOSPITAL_COMMUNITY): Payer: Medicare Other | Admitting: Physician Assistant

## 2022-08-23 ENCOUNTER — Ambulatory Visit (HOSPITAL_BASED_OUTPATIENT_CLINIC_OR_DEPARTMENT_OTHER): Payer: Medicare Other | Admitting: Anesthesiology

## 2022-08-23 ENCOUNTER — Observation Stay (HOSPITAL_COMMUNITY)
Admission: RE | Admit: 2022-08-23 | Discharge: 2022-08-24 | Disposition: A | Discharge status: Home / Self Care | Payer: Medicare Other | Attending: Orthopedic Surgery | Admitting: Orthopedic Surgery

## 2022-08-23 DIAGNOSIS — E1140 Type 2 diabetes mellitus with diabetic neuropathy, unspecified: Secondary | ICD-10-CM | POA: Insufficient documentation

## 2022-08-23 DIAGNOSIS — Z7901 Long term (current) use of anticoagulants: Secondary | ICD-10-CM | POA: Insufficient documentation

## 2022-08-23 DIAGNOSIS — Z86718 Personal history of other venous thrombosis and embolism: Secondary | ICD-10-CM | POA: Diagnosis not present

## 2022-08-23 DIAGNOSIS — Z7982 Long term (current) use of aspirin: Secondary | ICD-10-CM | POA: Diagnosis not present

## 2022-08-23 DIAGNOSIS — E119 Type 2 diabetes mellitus without complications: Secondary | ICD-10-CM | POA: Diagnosis not present

## 2022-08-23 DIAGNOSIS — G8918 Other acute postprocedural pain: Secondary | ICD-10-CM | POA: Diagnosis not present

## 2022-08-23 DIAGNOSIS — E039 Hypothyroidism, unspecified: Secondary | ICD-10-CM | POA: Insufficient documentation

## 2022-08-23 DIAGNOSIS — I480 Paroxysmal atrial fibrillation: Secondary | ICD-10-CM | POA: Insufficient documentation

## 2022-08-23 DIAGNOSIS — Z01818 Encounter for other preprocedural examination: Secondary | ICD-10-CM

## 2022-08-23 DIAGNOSIS — I10 Essential (primary) hypertension: Secondary | ICD-10-CM

## 2022-08-23 DIAGNOSIS — Z87891 Personal history of nicotine dependence: Secondary | ICD-10-CM | POA: Diagnosis not present

## 2022-08-23 DIAGNOSIS — M1711 Unilateral primary osteoarthritis, right knee: Secondary | ICD-10-CM

## 2022-08-23 DIAGNOSIS — Z7984 Long term (current) use of oral hypoglycemic drugs: Secondary | ICD-10-CM

## 2022-08-23 DIAGNOSIS — M179 Osteoarthritis of knee, unspecified: Secondary | ICD-10-CM | POA: Diagnosis present

## 2022-08-23 DIAGNOSIS — Z79899 Other long term (current) drug therapy: Secondary | ICD-10-CM | POA: Insufficient documentation

## 2022-08-23 HISTORY — PX: TOTAL KNEE ARTHROPLASTY: SHX125

## 2022-08-23 LAB — GLUCOSE, CAPILLARY
Glucose-Capillary: 147 mg/dL — ABNORMAL HIGH (ref 70–99)
Glucose-Capillary: 154 mg/dL — ABNORMAL HIGH (ref 70–99)
Glucose-Capillary: 244 mg/dL — ABNORMAL HIGH (ref 70–99)
Glucose-Capillary: 249 mg/dL — ABNORMAL HIGH (ref 70–99)

## 2022-08-23 SURGERY — ARTHROPLASTY, KNEE, TOTAL
Anesthesia: General | Site: Knee | Laterality: Right

## 2022-08-23 MED ORDER — DILTIAZEM HCL 30 MG PO TABS
30.0000 mg | ORAL_TABLET | Freq: Two times a day (BID) | ORAL | Status: DC
  Administered 2022-08-24: 30 mg via ORAL
  Filled 2022-08-23 (×2): qty 1

## 2022-08-23 MED ORDER — POTASSIUM CHLORIDE CRYS ER 20 MEQ PO TBCR: 20.0000 meq | EXTENDED_RELEASE_TABLET | ORAL | Status: DC

## 2022-08-23 MED ORDER — PROPOFOL 10 MG/ML IV BOLUS
INTRAVENOUS | Status: DC | PRN
  Administered 2022-08-23 (×2): 20 mg via INTRAVENOUS
  Administered 2022-08-23: 120 mg via INTRAVENOUS

## 2022-08-23 MED ORDER — CHLORHEXIDINE GLUCONATE 0.12 % MT SOLN
15.0000 mL | Freq: Once | OROMUCOSAL | Status: AC
  Administered 2022-08-23: 15 mL via OROMUCOSAL

## 2022-08-23 MED ORDER — METOCLOPRAMIDE HCL 5 MG/ML IJ SOLN: 5.0000 mg | Freq: Three times a day (TID) | INTRAMUSCULAR | Status: DC | PRN

## 2022-08-23 MED ORDER — LEVOTHYROXINE SODIUM 50 MCG PO TABS
50.0000 ug | ORAL_TABLET | Freq: Every day | ORAL | Status: DC
  Administered 2022-08-24: 50 ug via ORAL
  Filled 2022-08-23: qty 1

## 2022-08-23 MED ORDER — PROPOFOL 10 MG/ML IV BOLUS
INTRAVENOUS | Status: AC
  Filled 2022-08-23: qty 20

## 2022-08-23 MED ORDER — LOSARTAN POTASSIUM 50 MG PO TABS
100.0000 mg | ORAL_TABLET | Freq: Every day | ORAL | Status: DC
  Administered 2022-08-24: 100 mg via ORAL
  Filled 2022-08-23: qty 2

## 2022-08-23 MED ORDER — EPHEDRINE 5 MG/ML INJ
INTRAVENOUS | Status: AC
  Filled 2022-08-23: qty 5

## 2022-08-23 MED ORDER — ALPRAZOLAM 0.5 MG PO TABS
0.5000 mg | ORAL_TABLET | Freq: Every day | ORAL | Status: DC
Start: 2022-08-24 — End: 2022-08-24

## 2022-08-23 MED ORDER — CHLORTHALIDONE 25 MG PO TABS
25.0000 mg | ORAL_TABLET | Freq: Every day | ORAL | Status: DC
  Administered 2022-08-24: 25 mg via ORAL
  Filled 2022-08-23: qty 1

## 2022-08-23 MED ORDER — ACETAMINOPHEN 10 MG/ML IV SOLN
1000.0000 mg | Freq: Four times a day (QID) | INTRAVENOUS | Status: DC
  Administered 2022-08-23: 1000 mg via INTRAVENOUS
  Filled 2022-08-23: qty 100

## 2022-08-23 MED ORDER — ONDANSETRON HCL 4 MG/2ML IJ SOLN
INTRAMUSCULAR | Status: DC | PRN
  Administered 2022-08-23: 4 mg via INTRAVENOUS

## 2022-08-23 MED ORDER — PHENYLEPHRINE HCL-NACL 20-0.9 MG/250ML-% IV SOLN
INTRAVENOUS | Status: AC
  Filled 2022-08-23: qty 250

## 2022-08-23 MED ORDER — FENTANYL CITRATE PF 50 MCG/ML IJ SOSY
PREFILLED_SYRINGE | INTRAMUSCULAR | Status: AC
  Filled 2022-08-23: qty 2

## 2022-08-23 MED ORDER — ONDANSETRON HCL 4 MG/2ML IJ SOLN: 4.0000 mg | Freq: Four times a day (QID) | INTRAMUSCULAR | Status: DC | PRN

## 2022-08-23 MED ORDER — RIVAROXABAN 10 MG PO TABS
10.0000 mg | ORAL_TABLET | Freq: Every day | ORAL | Status: DC
  Administered 2022-08-24: 10 mg via ORAL
  Filled 2022-08-23: qty 1

## 2022-08-23 MED ORDER — FENTANYL CITRATE (PF) 100 MCG/2ML IJ SOLN
INTRAMUSCULAR | Status: AC
  Filled 2022-08-23: qty 2

## 2022-08-23 MED ORDER — DEXAMETHASONE SODIUM PHOSPHATE 10 MG/ML IJ SOLN
INTRAMUSCULAR | Status: AC
Start: 2022-08-23 — End: ?
  Filled 2022-08-23: qty 1

## 2022-08-23 MED ORDER — INSULIN ASPART 100 UNIT/ML IJ SOLN
0.0000 [IU] | Freq: Every day | INTRAMUSCULAR | Status: DC
  Administered 2022-08-23: 2 [IU] via SUBCUTANEOUS

## 2022-08-23 MED ORDER — PANTOPRAZOLE SODIUM 40 MG PO TBEC
40.0000 mg | DELAYED_RELEASE_TABLET | Freq: Every day | ORAL | Status: DC
  Administered 2022-08-24: 40 mg via ORAL
  Filled 2022-08-23: qty 1

## 2022-08-23 MED ORDER — LIDOCAINE HCL (PF) 2 % IJ SOLN
INTRAMUSCULAR | Status: AC
  Filled 2022-08-23: qty 5

## 2022-08-23 MED ORDER — DEXAMETHASONE SODIUM PHOSPHATE 4 MG/ML IJ SOLN
INTRAMUSCULAR | Status: DC | PRN
  Administered 2022-08-23: 8 mg via INTRAVENOUS

## 2022-08-23 MED ORDER — METHOCARBAMOL 500 MG PO TABS
500.0000 mg | ORAL_TABLET | Freq: Four times a day (QID) | ORAL | Status: DC | PRN
  Administered 2022-08-23 – 2022-08-24 (×2): 500 mg via ORAL
  Filled 2022-08-23 (×2): qty 1

## 2022-08-23 MED ORDER — FENTANYL CITRATE (PF) 100 MCG/2ML IJ SOLN
INTRAMUSCULAR | Status: DC | PRN
  Administered 2022-08-23 (×4): 50 ug via INTRAVENOUS

## 2022-08-23 MED ORDER — FENTANYL CITRATE PF 50 MCG/ML IJ SOSY
50.0000 ug | PREFILLED_SYRINGE | INTRAMUSCULAR | Status: DC
  Administered 2022-08-23: 50 ug via INTRAVENOUS
  Filled 2022-08-23: qty 2

## 2022-08-23 MED ORDER — BUPIVACAINE-EPINEPHRINE (PF) 0.5% -1:200000 IJ SOLN
INTRAMUSCULAR | Status: DC | PRN
  Administered 2022-08-23: 20 mL via PERINEURAL

## 2022-08-23 MED ORDER — LIDOCAINE HCL (CARDIAC) PF 100 MG/5ML IV SOSY
PREFILLED_SYRINGE | INTRAVENOUS | Status: DC | PRN
  Administered 2022-08-23: 60 mg via INTRAVENOUS

## 2022-08-23 MED ORDER — ATENOLOL 50 MG PO TABS
50.0000 mg | ORAL_TABLET | Freq: Two times a day (BID) | ORAL | Status: DC
  Administered 2022-08-23 – 2022-08-24 (×2): 50 mg via ORAL
  Filled 2022-08-23 (×2): qty 1

## 2022-08-23 MED ORDER — FLEET ENEMA 7-19 GM/118ML RE ENEM: 1.0000 | ENEMA | Freq: Once | RECTAL | Status: DC | PRN

## 2022-08-23 MED ORDER — SODIUM CHLORIDE 0.9 % IV SOLN: INTRAVENOUS | Status: DC

## 2022-08-23 MED ORDER — ORAL CARE MOUTH RINSE: 15.0000 mL | Freq: Once | OROMUCOSAL | Status: AC

## 2022-08-23 MED ORDER — LACTATED RINGERS IV SOLN: INTRAVENOUS | Status: DC

## 2022-08-23 MED ORDER — SODIUM CHLORIDE 0.9 % IR SOLN
Status: DC | PRN
  Administered 2022-08-23: 1000 mL

## 2022-08-23 MED ORDER — BUPIVACAINE LIPOSOME 1.3 % IJ SUSP
INTRAMUSCULAR | Status: AC
  Filled 2022-08-23: qty 20

## 2022-08-23 MED ORDER — OXYCODONE HCL 5 MG PO TABS
5.0000 mg | ORAL_TABLET | ORAL | Status: DC | PRN
  Administered 2022-08-23: 5 mg via ORAL
  Administered 2022-08-23 – 2022-08-24 (×4): 10 mg via ORAL
  Filled 2022-08-23 (×4): qty 2

## 2022-08-23 MED ORDER — MORPHINE SULFATE (PF) 2 MG/ML IV SOLN
1.0000 mg | INTRAVENOUS | Status: DC | PRN
  Administered 2022-08-23: 1 mg via INTRAVENOUS
  Filled 2022-08-23: qty 1

## 2022-08-23 MED ORDER — BISACODYL 10 MG RE SUPP: 10.0000 mg | Freq: Every day | RECTAL | Status: DC | PRN

## 2022-08-23 MED ORDER — SODIUM CHLORIDE (PF) 0.9 % IJ SOLN
INTRAMUSCULAR | Status: AC
  Filled 2022-08-23: qty 50

## 2022-08-23 MED ORDER — EPHEDRINE SULFATE (PRESSORS) 50 MG/ML IJ SOLN
INTRAMUSCULAR | Status: DC | PRN
  Administered 2022-08-23: 10 mg via INTRAVENOUS

## 2022-08-23 MED ORDER — GABAPENTIN 300 MG PO CAPS
300.0000 mg | ORAL_CAPSULE | Freq: Two times a day (BID) | ORAL | Status: DC
  Administered 2022-08-23 – 2022-08-24 (×2): 300 mg via ORAL
  Filled 2022-08-23 (×2): qty 1

## 2022-08-23 MED ORDER — ONDANSETRON HCL 4 MG PO TABS: 4.0000 mg | ORAL_TABLET | Freq: Four times a day (QID) | ORAL | Status: DC | PRN

## 2022-08-23 MED ORDER — DOCUSATE SODIUM 100 MG PO CAPS
100.0000 mg | ORAL_CAPSULE | Freq: Two times a day (BID) | ORAL | Status: DC
  Administered 2022-08-23 – 2022-08-24 (×2): 100 mg via ORAL
  Filled 2022-08-23 (×2): qty 1

## 2022-08-23 MED ORDER — POTASSIUM CHLORIDE CRYS ER 20 MEQ PO TBCR
40.0000 meq | EXTENDED_RELEASE_TABLET | Freq: Every day | ORAL | Status: DC
  Administered 2022-08-24: 40 meq via ORAL
  Filled 2022-08-23: qty 2

## 2022-08-23 MED ORDER — DIPHENHYDRAMINE HCL 12.5 MG/5ML PO ELIX: 12.5000 mg | ORAL_SOLUTION | ORAL | Status: DC | PRN

## 2022-08-23 MED ORDER — GLIPIZIDE ER 5 MG PO TB24
5.0000 mg | ORAL_TABLET | Freq: Two times a day (BID) | ORAL | Status: DC
  Administered 2022-08-23 – 2022-08-24 (×2): 5 mg via ORAL
  Filled 2022-08-23 (×2): qty 1

## 2022-08-23 MED ORDER — POTASSIUM CHLORIDE CRYS ER 20 MEQ PO TBCR
20.0000 meq | EXTENDED_RELEASE_TABLET | Freq: Every day | ORAL | Status: DC
  Administered 2022-08-23: 20 meq via ORAL
  Filled 2022-08-23: qty 1

## 2022-08-23 MED ORDER — MENTHOL 3 MG MT LOZG: 1.0000 | LOZENGE | OROMUCOSAL | Status: DC | PRN

## 2022-08-23 MED ORDER — BUPIVACAINE LIPOSOME 1.3 % IJ SUSP: 20.0000 mL | Freq: Once | INTRAMUSCULAR | Status: DC

## 2022-08-23 MED ORDER — SODIUM CHLORIDE 0.9 % IV SOLN
INTRAVENOUS | Status: DC | PRN
  Administered 2022-08-23: 80 mL

## 2022-08-23 MED ORDER — 0.9 % SODIUM CHLORIDE (POUR BTL) OPTIME
TOPICAL | Status: DC | PRN
  Administered 2022-08-23: 1000 mL

## 2022-08-23 MED ORDER — CEFAZOLIN SODIUM-DEXTROSE 2-4 GM/100ML-% IV SOLN
2.0000 g | Freq: Four times a day (QID) | INTRAVENOUS | Status: AC
  Administered 2022-08-23 (×2): 2 g via INTRAVENOUS
  Filled 2022-08-23 (×2): qty 100

## 2022-08-23 MED ORDER — METHOCARBAMOL 500 MG IVPB - SIMPLE MED: 500.0000 mg | Freq: Four times a day (QID) | INTRAVENOUS | Status: DC | PRN

## 2022-08-23 MED ORDER — CEFAZOLIN SODIUM-DEXTROSE 2-4 GM/100ML-% IV SOLN
2.0000 g | INTRAVENOUS | Status: AC
  Administered 2022-08-23: 2 g via INTRAVENOUS
  Filled 2022-08-23: qty 100

## 2022-08-23 MED ORDER — TRANEXAMIC ACID 1000 MG/10ML IV SOLN
2000.0000 mg | Freq: Once | INTRAVENOUS | Status: DC
  Filled 2022-08-23: qty 20

## 2022-08-23 MED ORDER — OXYCODONE HCL 5 MG PO TABS
ORAL_TABLET | ORAL | Status: AC
  Filled 2022-08-23: qty 1

## 2022-08-23 MED ORDER — POLYETHYLENE GLYCOL 3350 17 G PO PACK: 17.0000 g | PACK | Freq: Every day | ORAL | Status: DC | PRN

## 2022-08-23 MED ORDER — FENTANYL CITRATE PF 50 MCG/ML IJ SOSY
25.0000 ug | PREFILLED_SYRINGE | INTRAMUSCULAR | Status: DC | PRN
  Administered 2022-08-23 (×4): 25 ug via INTRAVENOUS

## 2022-08-23 MED ORDER — ROSUVASTATIN CALCIUM 10 MG PO TABS: 10.0000 mg | ORAL_TABLET | Freq: Every day | ORAL | Status: DC

## 2022-08-23 MED ORDER — SODIUM CHLORIDE (PF) 0.9 % IJ SOLN
INTRAMUSCULAR | Status: AC
  Filled 2022-08-23: qty 10

## 2022-08-23 MED ORDER — DEXAMETHASONE SODIUM PHOSPHATE 10 MG/ML IJ SOLN: 8.0000 mg | Freq: Once | INTRAMUSCULAR | Status: DC

## 2022-08-23 MED ORDER — POVIDONE-IODINE 10 % EX SWAB
2.0000 | Freq: Once | CUTANEOUS | Status: AC
  Administered 2022-08-23: 2 via TOPICAL

## 2022-08-23 MED ORDER — ONDANSETRON HCL 4 MG/2ML IJ SOLN
INTRAMUSCULAR | Status: AC
  Filled 2022-08-23: qty 2

## 2022-08-23 MED ORDER — PHENOL 1.4 % MT LIQD: 1.0000 | OROMUCOSAL | Status: DC | PRN

## 2022-08-23 MED ORDER — ACETAMINOPHEN 500 MG PO TABS
1000.0000 mg | ORAL_TABLET | Freq: Four times a day (QID) | ORAL | Status: AC
  Administered 2022-08-23 – 2022-08-24 (×4): 1000 mg via ORAL
  Filled 2022-08-23 (×4): qty 2

## 2022-08-23 MED ORDER — METOCLOPRAMIDE HCL 5 MG PO TABS: 5.0000 mg | ORAL_TABLET | Freq: Three times a day (TID) | ORAL | Status: DC | PRN

## 2022-08-23 MED ORDER — ALBUTEROL SULFATE (2.5 MG/3ML) 0.083% IN NEBU: 2.5000 mg | INHALATION_SOLUTION | Freq: Four times a day (QID) | RESPIRATORY_TRACT | Status: DC | PRN

## 2022-08-23 MED ORDER — METHOCARBAMOL 500 MG PO TABS
ORAL_TABLET | ORAL | Status: AC
  Filled 2022-08-23: qty 1

## 2022-08-23 MED ORDER — TRAMADOL HCL 50 MG PO TABS
50.0000 mg | ORAL_TABLET | Freq: Four times a day (QID) | ORAL | Status: DC | PRN
  Administered 2022-08-23: 50 mg via ORAL
  Filled 2022-08-23 (×2): qty 1

## 2022-08-23 MED ORDER — INSULIN ASPART 100 UNIT/ML IJ SOLN
0.0000 [IU] | Freq: Three times a day (TID) | INTRAMUSCULAR | Status: DC
  Administered 2022-08-23: 5 [IU] via SUBCUTANEOUS
  Administered 2022-08-24: 2 [IU] via SUBCUTANEOUS
  Administered 2022-08-24: 3 [IU] via SUBCUTANEOUS

## 2022-08-23 SURGICAL SUPPLY — 59 items
ATTUNE PS FEM RT SZ 5 CEM KNEE (Femur) IMPLANT
ATTUNE PSRP INSR SZ 5 10M KNEE (Insert) IMPLANT
BAG COUNTER SPONGE SURGICOUNT (BAG) IMPLANT
BAG SPEC THK2 15X12 ZIP CLS (MISCELLANEOUS) ×1
BAG SPNG CNTER NS LX DISP (BAG)
BAG ZIPLOCK 12X15 (MISCELLANEOUS) ×1 IMPLANT
BASE TIBIAL ROT PLAT SZ 5 KNEE (Knees) IMPLANT
BLADE SAG 18X100X1.27 (BLADE) ×1 IMPLANT
BLADE SAW SGTL 11.0X1.19X90.0M (BLADE) ×1 IMPLANT
BNDG ELASTIC 6X5.8 VLCR STR LF (GAUZE/BANDAGES/DRESSINGS) ×1 IMPLANT
BOWL SMART MIX CTS (DISPOSABLE) ×1 IMPLANT
BSPLAT TIB 5 CMNT ROT PLAT STR (Knees) ×1 IMPLANT
CEMENT HV SMART SET (Cement) ×2 IMPLANT
COVER SURGICAL LIGHT HANDLE (MISCELLANEOUS) ×1 IMPLANT
CUFF TOURN SGL QUICK 34 (TOURNIQUET CUFF) ×1
CUFF TRNQT CYL 34X4.125X (TOURNIQUET CUFF) ×1 IMPLANT
DRAPE INCISE IOBAN 66X45 STRL (DRAPES) ×1 IMPLANT
DRAPE U-SHAPE 47X51 STRL (DRAPES) ×1 IMPLANT
DRSG AQUACEL AG ADV 3.5X10 (GAUZE/BANDAGES/DRESSINGS) ×1 IMPLANT
DURAPREP 26ML APPLICATOR (WOUND CARE) ×1 IMPLANT
ELECT REM PT RETURN 15FT ADLT (MISCELLANEOUS) ×1 IMPLANT
GLOVE BIO SURGEON STRL SZ 6.5 (GLOVE) IMPLANT
GLOVE BIO SURGEON STRL SZ7.5 (GLOVE) IMPLANT
GLOVE BIO SURGEON STRL SZ8 (GLOVE) ×1 IMPLANT
GLOVE BIOGEL PI IND STRL 6.5 (GLOVE) IMPLANT
GLOVE BIOGEL PI IND STRL 7.0 (GLOVE) IMPLANT
GLOVE BIOGEL PI IND STRL 8 (GLOVE) ×1 IMPLANT
GLOVE BIOGEL PI INDICATOR 6.5 (GLOVE)
GLOVE BIOGEL PI INDICATOR 7.0 (GLOVE)
GLOVE BIOGEL PI INDICATOR 8 (GLOVE) ×1
GOWN STRL REUS W/ TWL LRG LVL3 (GOWN DISPOSABLE) ×1 IMPLANT
GOWN STRL REUS W/ TWL XL LVL3 (GOWN DISPOSABLE) IMPLANT
GOWN STRL REUS W/TWL LRG LVL3 (GOWN DISPOSABLE) ×1
GOWN STRL REUS W/TWL XL LVL3 (GOWN DISPOSABLE)
HANDPIECE INTERPULSE COAX TIP (DISPOSABLE) ×1
HOLDER FOLEY CATH W/STRAP (MISCELLANEOUS) IMPLANT
IMMOBILIZER KNEE 20 (SOFTGOODS) ×1
IMMOBILIZER KNEE 20 THIGH 36 (SOFTGOODS) ×1 IMPLANT
KIT TURNOVER KIT A (KITS) IMPLANT
MANIFOLD NEPTUNE II (INSTRUMENTS) ×1 IMPLANT
NS IRRIG 1000ML POUR BTL (IV SOLUTION) ×1 IMPLANT
PACK TOTAL KNEE CUSTOM (KITS) ×1 IMPLANT
PADDING CAST ABS COTTON 6X4 NS (CAST SUPPLIES) IMPLANT
PADDING CAST COTTON 6X4 STRL (CAST SUPPLIES) ×2 IMPLANT
PATELLA MEDIAL ATTUN 35MM KNEE (Knees) IMPLANT
PROTECTOR NERVE ULNAR (MISCELLANEOUS) ×1 IMPLANT
SET HNDPC FAN SPRY TIP SCT (DISPOSABLE) ×1 IMPLANT
SPIKE FLUID TRANSFER (MISCELLANEOUS) ×1 IMPLANT
STRIP CLOSURE SKIN 1/2X4 (GAUZE/BANDAGES/DRESSINGS) ×2 IMPLANT
SUT MNCRL AB 4-0 PS2 18 (SUTURE) ×1 IMPLANT
SUT STRATAFIX 0 PDS 27 VIOLET (SUTURE) ×1
SUT VIC AB 2-0 CT1 27 (SUTURE) ×3
SUT VIC AB 2-0 CT1 TAPERPNT 27 (SUTURE) ×3 IMPLANT
SUTURE STRATFX 0 PDS 27 VIOLET (SUTURE) ×1 IMPLANT
TIBIAL BASE ROT PLAT SZ 5 KNEE (Knees) ×1 IMPLANT
TRAY FOLEY MTR SLVR 16FR STAT (SET/KITS/TRAYS/PACK) ×1 IMPLANT
TUBE SUCTION HIGH CAP CLEAR NV (SUCTIONS) ×1 IMPLANT
WATER STERILE IRR 1000ML POUR (IV SOLUTION) ×2 IMPLANT
WRAP KNEE MAXI GEL POST OP (GAUZE/BANDAGES/DRESSINGS) ×1 IMPLANT

## 2022-08-23 NOTE — Anesthesia Procedure Notes (Signed)
Procedure Name: LMA Insertion Date/Time: 08/23/2022 9:36 AM  Performed by: Yuriy Cui, Forest Gleason, CRNAPre-anesthesia Checklist: Patient identified, Emergency Drugs available, Suction available, Patient being monitored and Timeout performed Patient Re-evaluated:Patient Re-evaluated prior to induction Oxygen Delivery Method: Circle system utilized Preoxygenation: Pre-oxygenation with 100% oxygen Induction Type: IV induction Ventilation: Mask ventilation without difficulty LMA: LMA inserted and LMA with gastric port inserted LMA Size: 4.0 Number of attempts: 1 Dental Injury: Teeth and Oropharynx as per pre-operative assessment

## 2022-08-23 NOTE — Progress Notes (Signed)
Orthopedic Tech Progress Note Patient Details:  Martha Torres [birth year] [id number]  CPM Right Knee CPM Right Knee: Off Right Knee Flexion (Degrees): 40 Right Knee Extension (Degrees): 10  Post Interventions Patient Tolerated: Well  Vernona Rieger 08/23/2022, 4:27 PM

## 2022-08-23 NOTE — Discharge Instructions (Signed)
 Frank Aluisio, MD Total Joint Specialist [facility] [address] [place], [place] [address] [phone]  TOTAL KNEE REPLACEMENT POSTOPERATIVE DIRECTIONS    Knee Rehabilitation, Guidelines Following Surgery  Results after knee surgery are often greatly improved when you follow the exercise, range of motion and muscle strengthening exercises prescribed by your doctor. Safety measures are also important to protect the knee from further injury. If any of these exercises cause you to have increased pain or swelling in your knee joint, decrease the amount until you are comfortable again and slowly increase them. If you have problems or questions, call your caregiver or physical therapist for advice.   HOME CARE INSTRUCTIONS  Remove items at home which could result in a fall. This includes throw rugs or furniture in walking pathways.  ICE to the affected knee as much as tolerated. Icing helps control swelling. If the swelling is well controlled you will be more comfortable and rehab easier. Continue to use ice on the knee for pain and swelling from surgery. You may notice swelling that will progress down to the foot and ankle. This is normal after surgery. Elevate the leg when you are not up walking on it.    Continue to use the breathing machine which will help keep your temperature down. It is common for your temperature to cycle up and down following surgery, especially at night when you are not up moving around and exerting yourself. The breathing machine keeps your lungs expanded and your temperature down. Do not place pillow under the operative knee, focus on keeping the knee straight while resting  DIET You may resume your previous home diet once you are discharged from the hospital.  DRESSING / WOUND CARE / SHOWERING Keep your bulky bandage on for 2 days. On the third post-operative day you may remove the Ace bandage and gauze. There is a waterproof  adhesive bandage on your skin which will stay in place until your first follow-up appointment. Once you remove this you will not need to place another bandage You may begin showering 3 days following surgery, but do not submerge the incision under water.  ACTIVITY For the first 5 days, the key is rest and control of pain and swelling Do your home exercises twice a day starting on post-operative day 3. On the days you go to physical therapy, just do the home exercises once that day. You should rest, ice and elevate the leg for 50 minutes out of every hour. Get up and walk/stretch for 10 minutes per hour. After 5 days you can increase your activity slowly as tolerated. Walk with your walker as instructed. Use the walker until you are comfortable transitioning to a cane. Walk with the cane in the opposite hand of the operative leg. You may discontinue the cane once you are comfortable and walking steadily. Avoid periods of inactivity such as sitting longer than an hour when not asleep. This helps prevent blood clots.  You may discontinue the knee immobilizer once you are able to perform a straight leg raise while lying down. You may resume a sexual relationship in one month or when given the OK by your doctor.  You may return to work once you are cleared by your doctor.  Do not drive a car for 6 weeks or until released by your surgeon.  Do not drive while taking narcotics.  TED HOSE STOCKINGS Wear the elastic stockings on both legs for three weeks following surgery during the   day. You may remove them at night for sleeping.  WEIGHT BEARING Weight bearing as tolerated with assist device (walker, cane, etc) as directed, use it as long as suggested by your surgeon or therapist, typically at least 4-6 weeks.  POSTOPERATIVE CONSTIPATION PROTOCOL Constipation - defined medically as fewer than three stools per week and severe constipation as less than one stool per week.  One of the most common issues  patients have following surgery is constipation.  Even if you have a regular bowel pattern at home, your normal regimen is likely to be disrupted due to multiple reasons following surgery.  Combination of anesthesia, postoperative narcotics, change in appetite and fluid intake all can affect your bowels.  In order to avoid complications following surgery, here are some recommendations in order to help you during your recovery period.  Colace (docusate) - Pick up an over-the-counter form of Colace or another stool softener and take twice a day as long as you are requiring postoperative pain medications.  Take with a full glass of water daily.  If you experience loose stools or diarrhea, hold the colace until you stool forms back up. If your symptoms do not get better within 1 week or if they get worse, check with your doctor. Dulcolax (bisacodyl) - Pick up over-the-counter and take as directed by the product packaging as needed to assist with the movement of your bowels.  Take with a full glass of water.  Use this product as needed if not relieved by Colace only.  MiraLax (polyethylene glycol) - Pick up over-the-counter to have on hand. MiraLax is a solution that will increase the amount of water in your bowels to assist with bowel movements.  Take as directed and can mix with a glass of water, juice, soda, coffee, or tea. Take if you go more than two days without a movement. Do not use MiraLax more than once per day. Call your doctor if you are still constipated or irregular after using this medication for 7 days in a row.  If you continue to have problems with postoperative constipation, please contact the office for further assistance and recommendations.  If you experience "the worst abdominal pain ever" or develop nausea or vomiting, please contact the office immediatly for further recommendations for treatment.  ITCHING If you experience itching with your medications, try taking only a single pain  pill, or even half a pain pill at a time.  You can also use Benadryl over the counter for itching or also to help with sleep.   MEDICATIONS See your medication summary on the "After Visit Summary" that the nursing staff will review with you prior to discharge.  You may have some home medications which will be placed on hold until you complete the course of blood thinner medication.  It is important for you to complete the blood thinner medication as prescribed by your surgeon.  Continue your approved medications as instructed at time of discharge.  PRECAUTIONS If you experience chest pain or shortness of breath - call 911 immediately for transfer to the hospital emergency department.  If you develop a fever greater that 101 F, purulent drainage from wound, increased redness or drainage from wound, foul odor from the wound/dressing, or calf pain - CONTACT YOUR SURGEON.                                                     FOLLOW-UP APPOINTMENTS Make sure you keep all of your appointments after your operation with your surgeon and caregivers. You should call the office at the above phone number and make an appointment for approximately two weeks after the date of your surgery or on the date instructed by your surgeon outlined in the "After Visit Summary".  RANGE OF MOTION AND STRENGTHENING EXERCISES  Rehabilitation of the knee is important following a knee injury or an operation. After just a few days of immobilization, the muscles of the thigh which control the knee become weakened and shrink (atrophy). Knee exercises are designed to build up the tone and strength of the thigh muscles and to improve knee motion. Often times heat used for twenty to thirty minutes before working out will loosen up your tissues and help with improving the range of motion but do not use heat for the first two weeks following surgery. These exercises can be done on a training (exercise) mat, on the floor, on a table or on a bed.  Use what ever works the best and is most comfortable for you Knee exercises include:  Leg Lifts - While your knee is still immobilized in a splint or cast, you can do straight leg raises. Lift the leg to 60 degrees, hold for 3 sec, and slowly lower the leg. Repeat 10-20 times 2-3 times daily. Perform this exercise against resistance later as your knee gets better.  Quad and Hamstring Sets - Tighten up the muscle on the front of the thigh (Quad) and hold for 5-10 sec. Repeat this 10-20 times hourly. Hamstring sets are done by pushing the foot backward against an object and holding for 5-10 sec. Repeat as with quad sets.  Leg Slides: Lying on your back, slowly slide your foot toward your buttocks, bending your knee up off the floor (only go as far as is comfortable). Then slowly slide your foot back down until your leg is flat on the floor again. Angel Wings: Lying on your back spread your legs to the side as far apart as you can without causing discomfort.  A rehabilitation program following serious knee injuries can speed recovery and prevent re-injury in the future due to weakened muscles. Contact your doctor or a physical therapist for more information on knee rehabilitation.   POST-OPERATIVE OPIOID TAPER INSTRUCTIONS: It is important to wean off of your opioid medication as soon as possible. If you do not need pain medication after your surgery it is ok to stop day one. Opioids include: Codeine, Hydrocodone(Norco, Vicodin), Oxycodone(Percocet, oxycontin) and hydromorphone amongst others.  Long term and even short term use of opiods can cause: Increased pain response Dependence Constipation Depression Respiratory depression And more.  Withdrawal symptoms can include Flu like symptoms Nausea, vomiting And more Techniques to manage these symptoms Hydrate well Eat regular healthy meals Stay active Use relaxation techniques(deep breathing, meditating, yoga) Do Not substitute Alcohol to help  with tapering If you have been on opioids for less than two weeks and do not have pain than it is ok to stop all together.  Plan to wean off of opioids This plan should start within one week post op of your joint replacement. Maintain the same interval or time between taking each dose and first decrease the dose.  Cut the total daily intake of opioids by one tablet each day Next start to increase the time between doses. The last dose that should be eliminated is the evening dose.   IF YOU ARE TRANSFERRED TO   A SKILLED REHAB FACILITY If the patient is transferred to a skilled rehab facility following release from the hospital, a list of the current medications will be sent to the facility for the patient to continue.  When discharged from the skilled rehab facility, please have the facility set up the patient's Home Health Physical Therapy prior to being released. Also, the skilled facility will be responsible for providing the patient with their medications at time of release from the facility to include their pain medication, the muscle relaxants, and their blood thinner medication. If the patient is still at the rehab facility at time of the two week follow up appointment, the skilled rehab facility will also need to assist the patient in arranging follow up appointment in our office and any transportation needs.  MAKE SURE YOU:  Understand these instructions.  Get help right away if you are not doing well or get worse.   DENTAL ANTIBIOTICS:  In most cases prophylactic antibiotics for Dental procdeures after total joint surgery are not necessary.  Exceptions are as follows:  1. History of prior total joint infection  2. Severely immunocompromised (Organ Transplant, cancer chemotherapy, Rheumatoid biologic medications such as Humera)  3. Poorly controlled diabetes (A1C &gt; 8.0, blood glucose over 200)  If you have one of these conditions, contact your surgeon for an antibiotic  prescription, prior to your dental procedure.    Pick up stool softner and laxative for home use following surgery while on pain medications. Do not submerge incision under water. Please use good hand washing techniques while changing dressing each day. May shower starting three days after surgery. Please use a clean towel to pat the incision dry following showers. Continue to use ice for pain and swelling after surgery. Do not use any lotions or creams on the incision until instructed by your surgeon.  

## 2022-08-23 NOTE — Interval H&P Note (Signed)
History and Physical Interval Note:  08/23/2022 8:29 AM  Martha Torres  has presented today for surgery, with the diagnosis of right knee osteoarthritis.  The various methods of treatment have been discussed with the patient and family. After consideration of risks, benefits and other options for treatment, the patient has consented to  Procedure(s): TOTAL KNEE ARTHROPLASTY (Right) as a surgical intervention.  The patient's history has been reviewed, patient examined, no change in status, stable for surgery.  I have reviewed the patient's chart and labs.  Questions were answered to the patient's satisfaction.     Pilar Plate Rhian Asebedo

## 2022-08-23 NOTE — Progress Notes (Signed)
Orthopedic Tech Progress Note Patient Details:  Martha Torres May 22, 1946 [id number]  CPM Right Knee CPM Right Knee: On Right Knee Flexion (Degrees): 40 Right Knee Extension (Degrees): 10  Post Interventions Patient Tolerated: Well  Arville Go 08/23/2022, 11:50 AM

## 2022-08-23 NOTE — Op Note (Addendum)
OPERATIVE REPORT-TOTAL KNEE ARTHROPLASTY   Pre-operative diagnosis- Osteoarthritis  Right knee(s)  Post-operative diagnosis- Osteoarthritis Right knee(s)  Procedure-  Right  Total Knee Arthroplasty  Surgeon- Dione Plover. Raymund Manrique, MD  Assistant- Molli Barrows, PA-C   Anesthesia-  Adductor canal block and general  EBL-50 mL   Drains None  Tourniquet time-  Total Tourniquet Time Documented: Thigh (Right) - 30 minutes Total: Thigh (Right) - 30 minutes     Complications- None  Condition-PACU - hemodynamically stable.   Brief Clinical Note  Martha Torres is a 76 y.o. year old female with end stage OA of her right knee with progressively worsening pain and dysfunction. She has constant pain, with activity and at rest and significant functional deficits with difficulties even with ADLs. She has had extensive non-op management including analgesics, injections of cortisone and viscosupplements, and home exercise program, but remains in significant pain with significant dysfunction.Radiographs show bone on bone arthritis medial and patellofemoral. She presents now for right Total Knee Arthroplasty.     Procedure in detail---   The patient is brought into the operating room and positioned supine on the operating table. After successful administration of  Adductor canal block and general,  a tourniquet is placed high on the  Right thigh(s) and the lower extremity is prepped and draped in the usual sterile fashion. Time out is performed by the operating team and then the  Right lower extremity is wrapped in Esmarch, knee flexed and the tourniquet inflated to 300 mmHg.       A midline incision is made with a ten blade through the subcutaneous tissue to the level of the extensor mechanism. A fresh blade is used to make a medial parapatellar arthrotomy. Soft tissue over the proximal medial tibia is subperiosteally elevated to the joint line with a knife and into the semimembranosus bursa with a Cobb  elevator. Soft tissue over the proximal lateral tibia is elevated with attention being paid to avoiding the patellar tendon on the tibial tubercle. The patella is everted, knee flexed 90 degrees and the ACL and PCL are removed. Findings are bone on bone medial and patellofemoral with large global osteophytes.        The drill is used to create a starting hole in the distal femur and the canal is thoroughly irrigated with sterile saline to remove the fatty contents. The 5 degree Right  valgus alignment guide is placed into the femoral canal and the distal femoral cutting block is pinned to remove 9 mm off the distal femur. Resection is made with an oscillating saw.      The tibia is subluxed forward and the menisci are removed. The extramedullary alignment guide is placed referencing proximally at the medial aspect of the tibial tubercle and distally along the second metatarsal axis and tibial crest. The block is pinned to remove 61m off the more deficient medial  side. Resection is made with an oscillating saw. Size 5is the most appropriate size for the tibia and the proximal tibia is prepared with the modular drill and keel punch for that size.      The femoral sizing guide is placed and size 5 is most appropriate. Rotation is marked off the epicondylar axis and confirmed by creating a rectangular flexion gap at 90 degrees. The size 5 cutting block is pinned in this rotation and the anterior, posterior and chamfer cuts are made with the oscillating saw. The intercondylar block is then placed and that cut is made.  Trial size 5 tibial component, trial size 5 posterior stabilized femur and a 10  mm posterior stabilized rotating platform insert trial is placed. Full extension is achieved with excellent varus/valgus and anterior/posterior balance throughout full range of motion. The patella is everted and thickness measured to be 22  mm. Free hand resection is taken to 12 mm, a 35 template is placed, lug holes  are drilled, trial patella is placed, and it tracks normally. Osteophytes are removed off the posterior femur with the trial in place. All trials are removed and the cut bone surfaces prepared with pulsatile lavage. Cement is mixed and once ready for implantation, the size 5 tibial implant, size  5 posterior stabilized femoral component, and the size 35 patella are cemented in place and the patella is held with the clamp. The trial insert is placed and the knee held in full extension. The Exparel (20 ml mixed with 60 ml saline) is injected into the extensor mechanism, posterior capsule, medial and lateral gutters and subcutaneous tissues.  All extruded cement is removed and once the cement is hard the permanent 10 mm posterior stabilized rotating platform insert is placed into the tibial tray.      The wound is copiously irrigated with saline solution and the extensor mechanism closed with # 0 Stratofix suture. The tourniquet is released for a total tourniquet time of 30  minutes. Flexion against gravity is 140 degrees and the patella tracks normally. Subcutaneous tissue is closed with 2.0 vicryl and subcuticular with running 4.0 Monocryl. The incision is cleaned and dried and steri-strips and a bulky sterile dressing are applied. The limb is placed into a knee immobilizer and the patient is awakened and transported to recovery in stable condition.      Please note that a surgical assistant was a medical necessity for this procedure in order to perform it in a safe and expeditious manner. Surgical assistant was necessary to retract the ligaments and vital neurovascular structures to prevent injury to them and also necessary for proper positioning of the limb to allow for anatomic placement of the prosthesis.   Dione Plover Martha Kirsten, MD    08/23/2022, 10:35 AM

## 2022-08-23 NOTE — Anesthesia Procedure Notes (Signed)
Anesthesia Regional Block: Adductor canal block   Pre-Anesthetic Checklist: , timeout performed,  Correct Patient, Correct Site, Correct Laterality,  Correct Procedure, Correct Position, site marked,  Risks and benefits discussed,  Pre-op evaluation,  At surgeon's request and post-op pain management  Laterality: Right  Prep: Maximum Sterile Barrier Precautions used, chloraprep       Needles:  Injection technique: Single-shot  Needle Type: Echogenic Stimulator Needle     Needle Length: 9cm  Needle Gauge: 21     Additional Needles:   Procedures:,,,, ultrasound used (permanent image in chart),,    Narrative:  Start time: 08/23/2022 8:21 AM End time: 08/23/2022 8:31 AM Injection made incrementally with aspirations every 5 mL.  Performed by: Personally  Anesthesiologist: Roderic Palau, MD  Additional Notes:

## 2022-08-23 NOTE — Anesthesia Postprocedure Evaluation (Signed)
Anesthesia Post Note  Patient: YASSMINE TAMM  Procedure(s) Performed: TOTAL KNEE ARTHROPLASTY (Right: Knee)     Patient location during evaluation: PACU Anesthesia Type: General and Regional Level of consciousness: awake and alert Pain management: pain level controlled Vital Signs Assessment: post-procedure vital signs reviewed and stable Respiratory status: spontaneous breathing, nonlabored ventilation, respiratory function stable and patient connected to nasal cannula oxygen Cardiovascular status: blood pressure returned to baseline and stable Postop Assessment: no apparent nausea or vomiting Anesthetic complications: no   No notable events documented.  Last Vitals:  Vitals:   08/23/22 1200 08/23/22 1300  BP: (!) 156/72 (!) 149/67  Pulse: 62 (!) 56  Resp: 16 15  Temp: (!) 36.4 C   SpO2: 96% 96%    Last Pain:  Vitals:   08/23/22 1300  TempSrc:   PainSc: Asleep                 Stephan Nelis,W. EDMOND

## 2022-08-23 NOTE — Evaluation (Signed)
Physical Therapy Evaluation Patient Details Name: Martha Torres MRN: [id number] DOB: 1946-09-12 Today's Date: 08/23/2022  History of Present Illness  Pt is a 76yo female s/p R-TKA on 08/23/22. PMH: afib, CAD, chronic low back pain with L4-L5 spondylolisthesis and history of multiple back surgeries, hx of DVT, HLD, hypothyroidism, MRSA 2014, neuropathy, DM.  Clinical Impression  Martha Torres is a 76 y.o. female POD 0 s/p R-TKA. Patient reports modified independence using SPC for mobility at baseline. Patient is now limited by functional impairments (see PT problem list below) and requires min assist for bed mobility and min guard for transfers, further mobility deferred secondary to pain and fatigue. Patient instructed in exercise to facilitate ROM and circulation to manage edema. Provided incentive spirometer and with Vcs pt able to achieve 736m. Patient will benefit from continued skilled PT interventions to address impairments and progress towards PLOF. Acute PT will follow to progress mobility and stair training in preparation for safe discharge home.       Recommendations for follow up therapy are one component of a multi-disciplinary discharge planning process, led by the attending physician.  Recommendations may be updated based on patient status, additional functional criteria and insurance authorization.  Follow Up Recommendations Follow physician's recommendations for discharge plan and follow up therapies (pt reporting some difficulties arranging [place] the [place]; [place] secondary to transportation issues[place]      Assistance Recommended at Discharge Intermittent Supervision/Assistance  Patient can return home with the following  A little help with walking and/or transfers;A little help with bathing/dressing/bathroom;Assistance with cooking/housework;Assist for transportation;Help with stairs or ramp  for entrance    Equipment Recommendations Rolling walker (2 wheels)  Recommendations for Other Services       Functional Status Assessment Patient has had a recent decline in their functional status and demonstrates the ability to make significant improvements in function in a reasonable and predictable amount of time.     Precautions / Restrictions Precautions Precautions: Fall Required Braces or Orthoses: Knee Immobilizer - Right Knee Immobilizer - Right: Discontinue once straight leg raise with < 10 degree lag Restrictions Weight Bearing Restrictions: No      Mobility  Bed Mobility Overal bed mobility: Needs Assistance Bed Mobility: Supine to Sit     Supine to sit: Min assist     General bed mobility comments: Min assist to bring RLE off bed and elevate trunk.    Transfers Overall transfer level: Needs assistance Equipment used: Rolling walker (2 wheels) Transfers: Sit to/from Stand, Bed to chair/wheelchair/BSC Sit to Stand: Min guard, From elevated surface   Step pivot transfers: Supervision, +2 safety/equipment       General transfer comment: Sit to stand: pt required min guard from elevated surface for safety only, no physical assist required, VCs for sequencing and powering up through BUE. Step pivot: pt completed from EOB<>BSC and BSC<>recliner, min guard +2 for safety, no physical assist required, VCs for sequencing.    Ambulation/Gait               General Gait Details: deferred  Stairs            Wheelchair Mobility    Modified Rankin (Stroke Patients Only)       Balance Overall balance assessment: Needs assistance Sitting-balance support: Feet supported, No upper extremity supported Sitting balance-Leahy Scale: Good     Standing balance support: During functional activity, Reliant on  assistive device for balance, Bilateral upper extremity supported Standing balance-Leahy Scale: Poor                                Pertinent Vitals/Pain Pain Assessment Pain Assessment: 0-10 Pain Score: 8  Pain Location: right knee Pain Descriptors / Indicators: Operative site guarding Pain Intervention(s): Limited activity within patient's tolerance, Monitored during session, Repositioned, Ice applied    Home Living Family/patient expects to be discharged to:: Private residence Living Arrangements: Alone Available Help at Discharge: Family;Friend(s);Neighbor;Available 24 hours/day Type of Home: House Home Access: Level entry       Home Layout: One level Home Equipment: Cane - single point      Prior Function Prior Level of Function : Independent/Modified Independent             Mobility Comments: SPC ADLs Comments: IND, reports that she is planning to sponge bathe after returning home.     Hand Dominance        Extremity/Trunk Assessment   Upper Extremity Assessment Upper Extremity Assessment: Overall WFL for tasks assessed    Lower Extremity Assessment Lower Extremity Assessment: RLE deficits/detail;LLE deficits/detail RLE Deficits / Details: MMT ank DF/PF 4/5, mild extensor lag noted pt only able to perform SLR to ~1" off bed. RLE Sensation: WNL LLE Deficits / Details: MMT ank DF/PF 4/5 LLE Sensation: WNL    Cervical / Trunk Assessment Cervical / Trunk Assessment: Back Surgery  Communication   Communication: No difficulties  Cognition Arousal/Alertness: Awake/alert Behavior During Therapy: WFL for tasks assessed/performed Overall Cognitive Status: Within Functional Limits for tasks assessed                                          General Comments      Exercises     Assessment/Plan    PT Assessment Patient needs continued PT services  PT Problem List Decreased strength;Decreased range of motion;Decreased activity tolerance;Decreased balance;Decreased mobility;Decreased coordination;Pain       PT Treatment Interventions DME instruction;Gait  training;Stair training;Functional mobility training;Therapeutic activities;Therapeutic exercise;Balance training;Neuromuscular re-education;Patient/family education    PT Goals (Current goals can be found in the Care Plan section)  Acute Rehab PT Goals Patient Stated Goal: To walk without pain PT Goal Formulation: With patient Time For Goal Achievement: 08/30/22 Potential to Achieve Goals: Good    Frequency 7X/week     Co-evaluation               AM-PAC PT "6 Clicks" Mobility  Outcome Measure Help needed turning from your back to your side while in a flat bed without using bedrails?: A Little Help needed moving from lying on your back to sitting on the side of a flat bed without using bedrails?: A Little Help needed moving to and from a bed to a chair (including a wheelchair)?: A Little Help needed standing up from a chair using your arms (e.g., wheelchair or bedside chair)?: A Little Help needed to walk in hospital room?: A Little Help needed climbing 3-5 steps with a railing? : A Little 6 Click Score: 18    End of Session Equipment Utilized During Treatment: Gait belt;Right knee immobilizer Activity Tolerance: Patient tolerated treatment well;No increased pain Patient left: in bed;with call bell/phone within reach;with chair alarm set;with SCD's reapplied Nurse Communication: Mobility status PT Visit Diagnosis: Pain;Difficulty in walking,  not elsewhere classified (R26.2) Pain - Right/Left: Right Pain - part of body: Knee    Time: 0312-8118 PT Time Calculation (min) (ACUTE ONLY): 16 min   Charges:   PT Evaluation $PT Eval Low Complexity: 1 Low          Coolidge Breeze, PT, DPT [facility] Office: [phone] Pager: [phone]  Coolidge Breeze 08/23/2022, 5:47 PM

## 2022-08-23 NOTE — Transfer of Care (Signed)
Immediate Anesthesia Transfer of Care Note  Patient: Martha Torres  Procedure(s) Performed: TOTAL KNEE ARTHROPLASTY (Right: Knee)  Patient Location: PACU  Anesthesia Type:Spinal  Level of Consciousness: awake, alert  and oriented  Airway & Oxygen Therapy: Patient Spontanous Breathing and Patient connected to face mask oxygen  Post-op Assessment: Report given to RN  Post vital signs: Reviewed and stable  Last Vitals:  Vitals Value Taken Time  BP 155/64 08/23/22 1100  Temp    Pulse 63 08/23/22 1102  Resp 29 08/23/22 1102  SpO2 98 % 08/23/22 1102  Vitals shown include unvalidated device data.  Last Pain:  Vitals:   08/23/22 0756  TempSrc:   PainSc: 0-No pain      Patients Stated Pain Goal: 4 (12/92/90 9030)  Complications: No notable events documented.

## 2022-08-23 NOTE — Care Plan (Signed)
Ortho Bundle Case Management Note  Patient Details  Name: Martha Torres MRN: [id number] Date of Birth: 06/03/1946  R TKA on 08-23-22 DCP:  Home with friends and family DME:  RW ordered through [facility] PT:  [place] on 08-26-22                   DME Arranged:  Gilford Rile rolling DME Agency:  [employer]  HH Arranged:  NA [place] Agency:  NA  Additional Comments: Please contact me with any questions of if this plan should need to change.  Marianne Sofia, RN,CCM [facility]  [phone] 08/23/2022, 2:38 PM

## 2022-08-24 ENCOUNTER — Encounter (HOSPITAL_COMMUNITY): Payer: Self-pay | Admitting: Orthopedic Surgery

## 2022-08-24 ENCOUNTER — Encounter: Payer: Self-pay | Admitting: Cardiology

## 2022-08-24 DIAGNOSIS — Z7982 Long term (current) use of aspirin: Secondary | ICD-10-CM | POA: Diagnosis not present

## 2022-08-24 DIAGNOSIS — Z87891 Personal history of nicotine dependence: Secondary | ICD-10-CM | POA: Diagnosis not present

## 2022-08-24 DIAGNOSIS — Z79899 Other long term (current) drug therapy: Secondary | ICD-10-CM | POA: Diagnosis not present

## 2022-08-24 DIAGNOSIS — E1140 Type 2 diabetes mellitus with diabetic neuropathy, unspecified: Secondary | ICD-10-CM | POA: Diagnosis not present

## 2022-08-24 DIAGNOSIS — Z7901 Long term (current) use of anticoagulants: Secondary | ICD-10-CM | POA: Diagnosis not present

## 2022-08-24 DIAGNOSIS — Z7984 Long term (current) use of oral hypoglycemic drugs: Secondary | ICD-10-CM | POA: Diagnosis not present

## 2022-08-24 DIAGNOSIS — Z86718 Personal history of other venous thrombosis and embolism: Secondary | ICD-10-CM | POA: Diagnosis not present

## 2022-08-24 DIAGNOSIS — I10 Essential (primary) hypertension: Secondary | ICD-10-CM | POA: Diagnosis not present

## 2022-08-24 DIAGNOSIS — I480 Paroxysmal atrial fibrillation: Secondary | ICD-10-CM | POA: Diagnosis not present

## 2022-08-24 DIAGNOSIS — Z96651 Presence of right artificial knee joint: Secondary | ICD-10-CM | POA: Diagnosis not present

## 2022-08-24 DIAGNOSIS — M1711 Unilateral primary osteoarthritis, right knee: Secondary | ICD-10-CM | POA: Diagnosis not present

## 2022-08-24 DIAGNOSIS — E039 Hypothyroidism, unspecified: Secondary | ICD-10-CM | POA: Diagnosis not present

## 2022-08-24 LAB — CBC
HCT: 39.7 % (ref 36.0–46.0)
Hemoglobin: 12.3 g/dL (ref 12.0–15.0)
MCH: 27.8 pg (ref 26.0–34.0)
MCHC: 31 g/dL (ref 30.0–36.0)
MCV: 89.8 fL (ref 80.0–100.0)
Platelets: 224 10*3/uL (ref 150–400)
RBC: 4.42 MIL/uL (ref 3.87–5.11)
RDW: 16.2 % — ABNORMAL HIGH (ref 11.5–15.5)
WBC: 14 10*3/uL — ABNORMAL HIGH (ref 4.0–10.5)
nRBC: 0 % (ref 0.0–0.2)

## 2022-08-24 LAB — GLUCOSE, CAPILLARY
Glucose-Capillary: 146 mg/dL — ABNORMAL HIGH (ref 70–99)
Glucose-Capillary: 151 mg/dL — ABNORMAL HIGH (ref 70–99)

## 2022-08-24 LAB — BASIC METABOLIC PANEL
Anion gap: 9 (ref 5–15)
BUN: 15 mg/dL (ref 8–23)
CO2: 26 mmol/L (ref 22–32)
Calcium: 9 mg/dL (ref 8.9–10.3)
Chloride: 103 mmol/L (ref 98–111)
Creatinine, Ser: 0.84 mg/dL (ref 0.44–1.00)
GFR, Estimated: >60 mL/min (ref >60)
Glucose, Bld: 198 mg/dL — ABNORMAL HIGH (ref 70–99)
Potassium: 3.8 mmol/L (ref 3.5–5.1)
Sodium: 138 mmol/L (ref 135–145)

## 2022-08-24 MED ORDER — OXYCODONE HCL 5 MG PO TABS: 5.0000 mg | ORAL_TABLET | Freq: Four times a day (QID) | ORAL | 0 refills | Status: DC | PRN

## 2022-08-24 MED ORDER — TRAMADOL HCL 50 MG PO TABS: 50.0000 mg | ORAL_TABLET | Freq: Four times a day (QID) | ORAL | 0 refills | Status: DC | PRN

## 2022-08-24 MED ORDER — OXYCODONE HCL 5 MG PO TABS: 5.0000 mg | ORAL_TABLET | ORAL | 0 refills | Status: DC | PRN

## 2022-08-24 MED ORDER — METHOCARBAMOL 500 MG PO TABS: 500.0000 mg | ORAL_TABLET | Freq: Four times a day (QID) | ORAL | 0 refills | Status: DC | PRN

## 2022-08-24 NOTE — Progress Notes (Signed)
Physical Therapy Treatment Patient Details Name: Martha Torres MRN: [id number] DOB: 1946/05/04 Today's Date: 08/24/2022   History of Present Illness Pt is a 76yo female s/p R-TKA on 08/23/22. PMH: afib, CAD, chronic low back pain with L4-L5 spondylolisthesis and history of multiple back surgeries, hx of DVT, HLD, hypothyroidism, MRSA 2014, neuropathy, DM.    PT Comments    Progressing with mobility. Continued gait and stair step training. Encouraged pt to ambulate often at home and to try to elevate R LE as able to help control swelling. All education completed.    Recommendations for follow up therapy are one component of a multi-disciplinary discharge planning process, led by the attending physician.  Recommendations may be updated based on patient status, additional functional criteria and insurance authorization.  Follow Up Recommendations  Follow physician's recommendations for discharge plan and follow up therapies     Assistance Recommended at Discharge Intermittent Supervision/Assistance  Patient can return home with the following A little help with walking and/or transfers;A little help with bathing/dressing/bathroom;Assistance with cooking/housework;Assist for transportation;Help with stairs or ramp for entrance   Equipment Recommendations  Rolling walker (2 wheels)    Recommendations for Other Services       Precautions / Restrictions Precautions Precautions: Fall Knee Immobilizer - Right: Discontinue once straight leg raise with < 10 degree lag Restrictions Weight Bearing Restrictions: No RLE Weight Bearing: Weight bearing as tolerated     Mobility  Bed Mobility               General bed mobility comments: oob in recliner    Transfers Overall transfer level: Needs assistance Equipment used: Rolling walker (2 wheels) Transfers: Sit to/from Stand Sit to Stand: Min guard           General transfer comment: Cues for safety, technique, hand/LE  placement. x2. Practiced from recliner and low toilet    Ambulation/Gait Ambulation/Gait assistance: Min guard Gait Distance (Feet): 50 Feet Assistive device: Rolling walker (2 wheels) Gait Pattern/deviations: Step-to pattern, Antalgic       General Gait Details: Min guard for safety. Slow but steady gait.   Stairs Stairs: Yes Stairs assistance: Min guard Stair Management: Step to pattern, Forwards, With walker Number of Stairs: 1 General stair comments: Cues for safety, technique, sequence. Min guard for safety.   Wheelchair Mobility    Modified Rankin (Stroke Patients Only)       Balance Overall balance assessment: Needs assistance         Standing balance support: During functional activity, Reliant on assistive device for balance, Bilateral upper extremity supported Standing balance-Leahy Scale: Poor                              Cognition Arousal/Alertness: Awake/alert Behavior During Therapy: WFL for tasks assessed/performed Overall Cognitive Status: Within Functional Limits for tasks assessed                                          Exercises Total Joint Exercises Ankle Circles/Pumps: AROM, Both, 10 reps Quad Sets: AROM, Both, 10 reps Heel Slides: AAROM, Right, 10 reps Hip ABduction/ADduction: AAROM, Right, 10 reps, AROM Straight Leg Raises: AROM, AAROM, Right, 10 reps Knee Flexion: AROM, Right, 10 reps, Seated Goniometric ROM: ~10-70 degrees    General Comments        Pertinent Vitals/Pain Pain Assessment  Pain Assessment: 0-10 Pain Score: 7  Pain Location: right knee Pain Descriptors / Indicators: Operative site guarding Pain Intervention(s): Limited activity within patient's tolerance, Monitored during session, Repositioned, Ice applied    Home Living                          Prior Function            PT Goals (current goals can now be found in the care plan section) Progress towards PT goals:  Progressing toward goals    Frequency    7X/week      PT Plan Current plan remains appropriate    Co-evaluation              AM-PAC PT "6 Clicks" Mobility   Outcome Measure  Help needed turning from your back to your side while in a flat bed without using bedrails?: A Little Help needed moving from lying on your back to sitting on the side of a flat bed without using bedrails?: A Little Help needed moving to and from a bed to a chair (including a wheelchair)?: A Little Help needed standing up from a chair using your arms (e.g., wheelchair or bedside chair)?: A Little Help needed to walk in hospital room?: A Little Help needed climbing 3-5 steps with a railing? : A Little 6 Click Score: 18    End of Session Equipment Utilized During Treatment: Gait belt Activity Tolerance: Patient tolerated treatment well Patient left: in chair;with call bell/phone within reach   PT Visit Diagnosis: Pain;Difficulty in walking, not elsewhere classified (R26.2) Pain - Right/Left: Right Pain - part of body: Knee     Time: 1751-0258 PT Time Calculation (min) (ACUTE ONLY): 16 min  Charges:  $Gait Training: 8-22 mins                        Doreatha Massed, PT Acute Rehabilitation  Office: [phone] Pager: [phone]

## 2022-08-24 NOTE — Progress Notes (Signed)
Subjective: 1 Day Post-Op Procedure(s) (LRB): TOTAL KNEE ARTHROPLASTY (Right) Patient seen in rounds by Dr. Wynelle Link. Patient is well, and has had no acute complaints or problems. Denies SOB or chest pain. Denies calf pain. Will remove foley cath this AM. Patient reports pain as mild.  Worked with physical therapy yesterday but did not ambulate due to pain. We will continue with physical therapy today.  Objective: Vital signs in last 24 hours: Temp:  [97.1 F (36.2 C)-98.2 F (36.8 C)] 98.1 F (36.7 C) (08/29 0553) Pulse Rate:  [55-71] 63 (08/29 0553) Resp:  [14-20] 17 (08/29 0553) BP: (121-164)/(53-84) 146/61 (08/29 0553) SpO2:  [91 %-100 %] 94 % (08/29 0553) Weight:  [89.4 kg] 89.4 kg (08/28 0756)  Intake/Output from previous day:  Intake/Output Summary (Last 24 hours) at 08/24/2022 0751 Last data filed at 08/24/2022 1610 Gross per 24 hour  Intake 2711.34 ml  Output 1700 ml  Net 1011.34 ml     Intake/Output this shift: No intake/output data recorded.  Labs: Recent Labs    08/24/22 0340  HGB 12.3   Recent Labs    08/24/22 0340  WBC 14.0*  RBC 4.42  HCT 39.7  PLT 224   Recent Labs    08/24/22 0340  NA 138  K 3.8  CL 103  CO2 26  BUN 15  CREATININE 0.84  GLUCOSE 198*  CALCIUM 9.0   No results for input(s): "LABPT", "INR" in the last 72 hours.  Exam: General - Patient is Alert and Oriented Extremity - Neurologically intact Neurovascular intact Sensation intact distally Dorsiflexion/Plantar flexion intact Dressing - dressing C/D/I Motor Function - intact, moving foot and toes well on exam.  Past Medical History:  Diagnosis Date   A-fib ([place])    Carotid artery disease ([place])    Right CEA in 2002; right carotid artery stent 2021   Chronic low back pain    MRI 2014: Spondylolisthesis of L4-L5 with foraminal narrowing, most prominent on the right and facet arthropathy.  L4-L5 surgery planned for 04/2013 with left-sided pedicle screw fixation.    Degenerative joint disease    Depression    Diarrhea, functional    DVT (deep venous thrombosis) (HCC)    Essential hypertension    Gastroesophageal reflux    H/o stricture & hiatal hernia   History of cardiac catheterization    Normal coronaries 2006   History of kidney stones    Hyperlipidemia    Hypothyroidism    MRSA (methicillin resistant Staphylococcus aureus) 05/2013   Neuropathy    right leg   Paroxysmal atrial fibrillation (HCC) 04/2013   PONV (postoperative nausea and vomiting)    Type 2 diabetes mellitus (HCC)     Assessment/Plan: 1 Day Post-Op Procedure(s) (LRB): TOTAL KNEE ARTHROPLASTY (Right) Principal Problem:   OA (osteoarthritis) of knee Active Problems:   Osteoarthritis of right knee  Estimated body mass index is 36.03 kg/m as calculated from the following:   Height as of this encounter: '5\' 2"'$  (1.575 m).   Weight as of this encounter: 89.4 kg. Advance diet Up with therapy D/C IV fluids  Patient's anticipated LOS is less than 2 midnights, meeting these requirements: - Lives within 1 hour of care - Has a competent adult at home to recover with post-op - NO history of             - Chronic pain requiring opiods             - Coronary Artery Disease             -  Heart failure             - Heart attack             - Stroke             - Respiratory Failure/COPD             - Renal failure             - Anemia             - Advanced Liver disease  DVT Prophylaxis - Aspirin and Xarelto Weight bearing as tolerated. Continue therapy.  Plan is to go Home after hospital stay. Expected discharge today pending progress with physical therapy and meeting patient goals. Scheduled for OPPT at [facility]. Will follow-up in clinic in 2 weeks.  The PDMP database was reviewed today prior to any opioid medications being prescribed to this patient.  R. Jaynie Bream, PA-C Orthopedic Surgery [phone] 08/24/2022, 7:51 AM

## 2022-08-24 NOTE — TOC Transition Note (Signed)
Transition of Care [facility]) - CM/SW Discharge Note  Patient Details  Name: Martha Torres MRN: [id number] Date of Birth: [birth year]  Transition of Care [facility] CM/SW Contact:  Sherie Don, LCSW Phone Number: 08/24/2022, 10:11 AM  Clinical Narrative: Patient is expected to discharge home after working with PT. CSW met with patient to confirm discharge plan and needs. Patient will discharge home with OPPT at [facility]. Patient will need a rolling walker, which was delivered to patient's room by [employer]. Patient declined to private pay for a 3N1 as family reported a family member may have a 3N1 she can borrow. TOC signing off.    Final next level of care: OP Rehab Barriers to Discharge: No Barriers Identified  Patient Goals and CMS Choice Patient states their goals for this hospitalization and ongoing recovery are:: Discharge home with [place] CMS Medicare.gov Compare Post Acute Care list provided to:: Patient Choice offered to / list presented to : Patient  Discharge Plan and Services        DME Arranged: Walker rolling DME Agency: [employer] Representative spoke with at DME Agency: Prearranged in orthopedist's office HH Arranged: NA [place] Agency: NA  Readmission Risk Interventions    04/22/2020    9:43 AM  Readmission Risk Prevention Plan  Post Dischage Appt Complete  Medication Screening Complete  Transportation Screening Complete

## 2022-08-24 NOTE — Plan of Care (Signed)
Plan of care reviewed and discussed with the patient. 

## 2022-08-24 NOTE — Plan of Care (Signed)
Patient discharged home with son Barbaraann Rondo via private vehicle. Ivan Anchors, RN 08/24/22 4:45 PM

## 2022-08-24 NOTE — Progress Notes (Signed)
Physical Therapy Treatment Patient Details Name: Martha Torres MRN: [id number] DOB: Sep 07, 1946 Today's Date: 08/24/2022   History of Present Illness Pt is a 76yo female s/p R-TKA on 08/23/22. PMH: afib, CAD, chronic low back pain with L4-L5 spondylolisthesis and history of multiple back surgeries, hx of DVT, HLD, hypothyroidism, MRSA 2014, neuropathy, DM.    PT Comments    Progressing with mobility. Will plan to have a 2nd session prior to d/c home later today.    Recommendations for follow up therapy are one component of a multi-disciplinary discharge planning process, led by the attending physician.  Recommendations may be updated based on patient status, additional functional criteria and insurance authorization.  Follow Up Recommendations  Follow physician's recommendations for discharge plan and follow up therapies     Assistance Recommended at Discharge Intermittent Supervision/Assistance  Patient can return home with the following A little help with walking and/or transfers;A little help with bathing/dressing/bathroom;Assistance with cooking/housework;Assist for transportation;Help with stairs or ramp for entrance   Equipment Recommendations  Rolling walker (2 wheels)    Recommendations for Other Services       Precautions / Restrictions Precautions Precautions: Fall Restrictions Weight Bearing Restrictions: No RLE Weight Bearing: Weight bearing as tolerated     Mobility  Bed Mobility               General bed mobility comments: oob in recliner    Transfers Overall transfer level: Needs assistance Equipment used: Rolling walker (2 wheels) Transfers: Sit to/from Stand Sit to Stand: Min assist           General transfer comment: Assist to rise, steady, control descent. Cues for safety, technique, hand/LE placement.    Ambulation/Gait Ambulation/Gait assistance: Min assist Gait Distance (Feet): 50 Feet Assistive device: Rolling walker (2 wheels) Gait  Pattern/deviations: Step-to pattern, Antalgic       General Gait Details: Intermittent assis to steady. Pt reported some lightheadedness and had to stand and rest briefly. She was able to continue and walk back to her room.   Stairs             Wheelchair Mobility    Modified Rankin (Stroke Patients Only)       Balance Overall balance assessment: Needs assistance         Standing balance support: During functional activity, Reliant on assistive device for balance, Bilateral upper extremity supported Standing balance-Leahy Scale: Poor                              Cognition Arousal/Alertness: Awake/alert Behavior During Therapy: WFL for tasks assessed/performed Overall Cognitive Status: Within Functional Limits for tasks assessed                                          Exercises Total Joint Exercises Ankle Circles/Pumps: AROM, Both, 10 reps Quad Sets: AROM, Both, 10 reps Heel Slides: AAROM, Right, 10 reps Hip ABduction/ADduction: AAROM, Right, 10 reps, AROM Straight Leg Raises: AROM, AAROM, Right, 10 reps Knee Flexion: AROM, Right, 10 reps, Seated Goniometric ROM: ~10-70 degrees    General Comments        Pertinent Vitals/Pain Pain Assessment Pain Assessment: 0-10 Pain Score: 8  Pain Location: right knee Pain Descriptors / Indicators: Operative site guarding Pain Intervention(s): Limited activity within patient's tolerance, Monitored during session, Repositioned, Ice applied  Home Living                          Prior Function            PT Goals (current goals can now be found in the care plan section) Progress towards PT goals: Progressing toward goals    Frequency    7X/week      PT Plan Current plan remains appropriate    Co-evaluation              AM-PAC PT "6 Clicks" Mobility   Outcome Measure  Help needed turning from your back to your side while in a flat bed without using  bedrails?: A Little Help needed moving from lying on your back to sitting on the side of a flat bed without using bedrails?: A Little Help needed moving to and from a bed to a chair (including a wheelchair)?: A Little Help needed standing up from a chair using your arms (e.g., wheelchair or bedside chair)?: A Little Help needed to walk in hospital room?: A Little Help needed climbing 3-5 steps with a railing? : A Little 6 Click Score: 18    End of Session Equipment Utilized During Treatment: Gait belt Activity Tolerance: Patient tolerated treatment well Patient left: in chair;with call bell/phone within reach;with family/visitor present   PT Visit Diagnosis: Pain;Difficulty in walking, not elsewhere classified (R26.2) Pain - Right/Left: Right Pain - part of body: Knee     Time: 1012-1030 PT Time Calculation (min) (ACUTE ONLY): 18 min  Charges:  $Gait Training: 8-22 mins                         Doreatha Massed, PT Acute Rehabilitation  Office: [phone] Pager: [phone]

## 2022-08-24 NOTE — Plan of Care (Signed)
Problem: Clinical Measurements: Goal: Postoperative complications will be avoided or minimized Outcome: Progressing   Problem: Pain Management: Goal: Pain level will decrease with appropriate interventions Outcome: Progressing  Problem: Health Behavior/Discharge Planning: Goal: Ability to manage health-related needs will improve Outcome: Progressing   Ivan Anchors, RN. 08/24/22 3:44 PM

## 2022-08-25 NOTE — Discharge Summary (Signed)
Physician Discharge Summary   Patient ID: Martha Torres MRN: [id number] DOB/AGE: Dec 01, 1946 76 y.o.  Admit date: 08/23/2022 Discharge date: 08/24/2022  Primary Diagnosis: Osteoarthritis, right knee   Admission Diagnoses:  Past Medical History:  Diagnosis Date   A-fib [facility])    Carotid artery disease ([place])    Right CEA in 2002; right carotid artery stent 2021   Chronic low back pain    MRI 2014: Spondylolisthesis of L4-L5 with foraminal narrowing, most prominent on the right and facet arthropathy.  L4-L5 surgery planned for 04/2013 with left-sided pedicle screw fixation.   Degenerative joint disease    Depression    Diarrhea, functional    DVT (deep venous thrombosis) (HCC)    Essential hypertension    Gastroesophageal reflux    H/o stricture & hiatal hernia   History of cardiac catheterization    Normal coronaries 2006   History of kidney stones    Hyperlipidemia    Hypothyroidism    MRSA (methicillin resistant Staphylococcus aureus) 05/2013   Neuropathy    right leg   Paroxysmal atrial fibrillation ([place]) 04/2013   PONV (postoperative nausea and vomiting)    Type 2 diabetes mellitus ([place])    Discharge Diagnoses:   Principal Problem:   OA (osteoarthritis) of knee Active Problems:   Osteoarthritis of right knee  Estimated body mass index is 36.03 kg/m as calculated from the following:   Height as of this encounter: '5\' 2"'$  (1.575 m).   Weight as of this encounter: 89.4 kg.  Procedure:  Procedure(s) (LRB): TOTAL KNEE ARTHROPLASTY (Right)   Consults: None  HPI: Martha Torres is a 76 y.o. year old female with end stage OA of her right knee with progressively worsening pain and dysfunction. She has constant pain, with activity and at rest and significant functional deficits with difficulties even with ADLs. She has had extensive non-op management including analgesics, injections of cortisone and viscosupplements, and home exercise program, but remains in significant pain  with significant dysfunction. Radiographs show bone on bone arthritis medial and patellofemoral. She presents now for right Total Knee Arthroplasty.  Laboratory Data: Admission on 08/23/2022, Discharged on 08/24/2022  Component Date Value Ref Range Status   Glucose-Capillary 08/23/2022 147 (H)  70 - 99 mg/dL Final   Glucose reference range applies only to samples taken after fasting for at least 8 hours.   Comment 1 08/23/2022 Notify RN   Final   Comment 2 08/23/2022 Document in Chart   Final   Glucose-Capillary 08/23/2022 154 (H)  70 - 99 mg/dL Final   Glucose reference range applies only to samples taken after fasting for at least 8 hours.   Glucose-Capillary 08/23/2022 244 (H)  70 - 99 mg/dL Final   Glucose reference range applies only to samples taken after fasting for at least 8 hours.   WBC 08/24/2022 14.0 (H)  4.0 - 10.5 K/uL Final   RBC 08/24/2022 4.42  3.87 - 5.11 MIL/uL Final   Hemoglobin 08/24/2022 12.3  12.0 - 15.0 g/dL Final   HCT 08/24/2022 39.7  36.0 - 46.0 % Final   MCV 08/24/2022 89.8  80.0 - 100.0 fL Final   MCH 08/24/2022 27.8  26.0 - 34.0 pg Final   MCHC 08/24/2022 31.0  30.0 - 36.0 g/dL Final   RDW 08/24/2022 16.2 (H)  11.5 - 15.5 % Final   Platelets 08/24/2022 224  150 - 400 K/uL Final   nRBC 08/24/2022 0.0  0.0 - 0.2 % Final   Performed at  [facility], [place] [address], [place], [place] [address]   Sodium 08/24/2022 138  135 - 145 mmol/L Final   Potassium 08/24/2022 3.8  3.5 - 5.1 mmol/L Final   Chloride 08/24/2022 103  98 - 111 mmol/L Final   CO2 08/24/2022 26  22 - 32 mmol/L Final   Glucose, Bld 08/24/2022 198 (H)  70 - 99 mg/dL Final   Glucose reference range applies only to samples taken after fasting for at least 8 hours.   BUN 08/24/2022 15  8 - 23 mg/dL Final   Creatinine, Ser 08/24/2022 0.84  0.44 - 1.00 mg/dL Final   Calcium 08/24/2022 9.0  8.9 - 10.3 mg/dL Final   GFR, Estimated 08/24/2022 >60  >60 mL/min Final   Comment:  (NOTE) Calculated using the CKD-EPI Creatinine Equation (2021)    Anion gap 08/24/2022 9  5 - 15 Final   Performed at [facility], [place] [address], [place], [place] [address]   Glucose-Capillary 08/23/2022 249 (H)  70 - 99 mg/dL Final   Glucose reference range applies only to samples taken after fasting for at least 8 hours.   Glucose-Capillary 08/24/2022 146 (H)  70 - 99 mg/dL Final   Glucose reference range applies only to samples taken after fasting for at least 8 hours.   Glucose-Capillary 08/24/2022 151 (H)  70 - 99 mg/dL Final   Glucose reference range applies only to samples taken after fasting for at least 8 hours.  Hospital Outpatient Visit on 08/10/2022  Component Date Value Ref Range Status   MRSA, PCR 08/10/2022 NEGATIVE  NEGATIVE Final   Staphylococcus aureus 08/10/2022 NEGATIVE  NEGATIVE Final   Comment: (NOTE) The Xpert SA Assay (FDA approved for NASAL specimens in patients 87 years of age and older), is one component of a comprehensive surveillance program. It is not intended to diagnose infection nor to guide or monitor treatment. Performed at [facility], [place] [address] [place], [place] [address]    WBC 08/10/2022 10.2  4.0 - 10.5 K/uL Final   RBC 08/10/2022 4.77  3.87 - 5.11 MIL/uL Final   Hemoglobin 08/10/2022 13.5  12.0 - 15.0 g/dL Final   HCT 08/10/2022 42.8  36.0 - 46.0 % Final   MCV 08/10/2022 89.7  80.0 - 100.0 fL Final   MCH 08/10/2022 28.3  26.0 - 34.0 pg Final   MCHC 08/10/2022 31.5  30.0 - 36.0 g/dL Final   RDW 08/10/2022 17.6 (H)  11.5 - 15.5 % Final   Platelets 08/10/2022 270  150 - 400 K/uL Final   nRBC 08/10/2022 0.0  0.0 - 0.2 % Final   Performed at [facility], [place] [address], [place], [place] [address]   Hgb A1c MFr Bld 08/10/2022 7.3 (H)  4.8 - 5.6 % Final   Comment: (NOTE) Pre diabetes:          5.7%-6.4%  Diabetes:              >6.4%  Glycemic control for   <7.0% adults with  diabetes    Mean Plasma Glucose 08/10/2022 162.81  mg/dL Final   Performed at [facility], [place] [address], [place], [place] [address]   Sodium 08/10/2022 141  135 - 145 mmol/L Final   Potassium 08/10/2022 3.7  3.5 - 5.1 mmol/L Final   Chloride 08/10/2022 105  98 - 111 mmol/L Final   CO2 08/10/2022 27  22 - 32 mmol/L Final   Glucose, Bld  08/10/2022 120 (H)  70 - 99 mg/dL Final   Glucose reference range applies only to samples taken after fasting for at least 8 hours.   BUN 08/10/2022 22  8 - 23 mg/dL Final   Creatinine, Ser 08/10/2022 0.93  0.44 - 1.00 mg/dL Final   Calcium 08/10/2022 9.5  8.9 - 10.3 mg/dL Final   GFR, Estimated 08/10/2022 >60  >60 mL/min Final   Comment: (NOTE) Calculated using the CKD-EPI Creatinine Equation (2021)    Anion gap 08/10/2022 9  5 - 15 Final   Performed at [facility], [place] [address], [place], [place] [address]   Glucose-Capillary 08/10/2022 125 (H)  70 - 99 mg/dL Final   Glucose reference range applies only to samples taken after fasting for at least 8 hours.     X-Rays:No results found.  EKG: Orders placed or performed during the hospital encounter of 02/10/22   EKG 12-Lead   EKG 12-Lead     Hospital Course: Martha Torres is a 76 y.o. who was admitted to [facility]. They were brought to the operating room on 08/23/2022 and underwent Procedure(s): TOTAL KNEE ARTHROPLASTY.  Patient tolerated the procedure well and was later transferred to the recovery room and then to the orthopaedic floor for postoperative care. They were given PO and IV analgesics for pain control following their surgery. They were given 24 hours of postoperative antibiotics of  Anti-infectives (From admission, onward)    Start     Dose/Rate Route Frequency Ordered Stop   08/23/22 1600  ceFAZolin (ANCEF) IVPB 2g/100 mL premix        2 g 200 mL/hr over 30 Minutes Intravenous Every 6 hours 08/23/22 1314 08/23/22 2305   08/23/22 0730  ceFAZolin  (ANCEF) IVPB 2g/100 mL premix        2 g 200 mL/hr over 30 Minutes Intravenous On call to O.R. 08/23/22 4742 08/23/22 5956      and started on DVT prophylaxis in the form of Aspirin and Xarelto.   PT and OT were ordered for total joint protocol. Discharge planning consulted to help with postop disposition and equipment needs.  Patient had a fair night on the evening of surgery. They started to get up OOB with therapy on POD #0. Pt was seen during rounds and was ready to go home pending progress with therapy. She worked with therapy on POD #1 and was meeting her goals. Pt was discharged to home later that day in stable condition.  Diet: Diabetic diet Activity: WBAT Follow-up: in 2 weeks Disposition: Home Discharged Condition: stable   Discharge Instructions     Call MD / Call 911   Complete by: As directed    If you experience chest pain or shortness of breath, CALL 911 and be transported to the hospital emergency room.  If you develope a fever above 101 F, pus (white drainage) or increased drainage or redness at the wound, or calf pain, call your surgeon's office.   Change dressing   Complete by: As directed    You may remove the bulky bandage (ACE wrap and gauze) two days after surgery. You will have an adhesive waterproof bandage underneath. Leave this in place until your first follow-up appointment.   Constipation Prevention   Complete by: As directed    Drink plenty of fluids.  Prune juice may be helpful.  You may use a stool softener, such as Colace (over the counter) 100 mg twice a day.  Use MiraLax (  over the counter) for constipation as needed.   Diet - low sodium heart healthy   Complete by: As directed    Do not put a pillow under the knee. Place it under the heel.   Complete by: As directed    Driving restrictions   Complete by: As directed    No driving for two weeks   Post-operative opioid taper instructions:   Complete by: As directed    POST-OPERATIVE OPIOID TAPER  INSTRUCTIONS: It is important to wean off of your opioid medication as soon as possible. If you do not need pain medication after your surgery it is ok to stop day one. Opioids include: Codeine, Hydrocodone(Norco, Vicodin), Oxycodone(Percocet, oxycontin) and hydromorphone amongst others.  Long term and even short term use of opiods can cause: Increased pain response Dependence Constipation Depression Respiratory depression And more.  Withdrawal symptoms can include Flu like symptoms Nausea, vomiting And more Techniques to manage these symptoms Hydrate well Eat regular healthy meals Stay active Use relaxation techniques(deep breathing, meditating, yoga) Do Not substitute Alcohol to help with tapering If you have been on opioids for less than two weeks and do not have pain than it is ok to stop all together.  Plan to wean off of opioids This plan should start within one week post op of your joint replacement. Maintain the same interval or time between taking each dose and first decrease the dose.  Cut the total daily intake of opioids by one tablet each day Next start to increase the time between doses. The last dose that should be eliminated is the evening dose.      TED hose   Complete by: As directed    Use stockings (TED hose) for three weeks on both leg(s).  You may remove them at night for sleeping.   Weight bearing as tolerated   Complete by: As directed       Allergies as of 08/24/2022   No Known Allergies      Medication List     TAKE these medications    Accu-Chek Softclix Lancets lancets 1 each 3 (three) times daily.   acetaminophen 650 MG CR tablet Commonly known as: TYLENOL Take 650 mg by mouth 2 (two) times daily.   albuterol 108 (90 Base) MCG/ACT inhaler Commonly known as: VENTOLIN HFA Inhale 1-2 puffs into the lungs every 6 (six) hours as needed for wheezing or shortness of breath.   ALPRAZolam 0.5 MG tablet Commonly known as: XANAX Take 0.5  mg by mouth at bedtime.   aspirin EC 81 MG tablet Take 81 mg by mouth daily.   atenolol 50 MG tablet Commonly known as: TENORMIN Take 1 tablet (50 mg total) by mouth 2 (two) times daily.   chlorthalidone 25 MG tablet Commonly known as: HYGROTON Take 25 mg by mouth daily.   diltiazem 30 MG tablet Commonly known as: CARDIZEM TAKE 1 TABLET BY MOUTH TWICE  DAILY   gabapentin 300 MG capsule Commonly known as: NEURONTIN Take 300 mg by mouth 2 (two) times daily.   glipiZIDE 5 MG 24 hr tablet Commonly known as: GLUCOTROL XL Take 5 mg by mouth 2 (two) times daily.   levothyroxine 50 MCG tablet Commonly known as: SYNTHROID Take 50 mcg by mouth daily before breakfast.   losartan 100 MG tablet Commonly known as: COZAAR Take 100 mg by mouth daily.   MAGNESIUM PO Take 250 mg by mouth daily.   methocarbamol 500 MG tablet Commonly known as: ROBAXIN Take 1  tablet (500 mg total) by mouth every 6 (six) hours as needed for muscle spasms.   omeprazole 20 MG capsule Commonly known as: PRILOSEC Take 20 mg by mouth daily.   oxyCODONE 5 MG immediate release tablet Commonly known as: Oxy IR/ROXICODONE Take 1-2 tablets (5-10 mg total) by mouth every 6 (six) hours as needed for severe pain.   potassium chloride SA 20 MEQ tablet Commonly known as: KLOR-CON M Take 1-2 tablets (20-40 mEq total) by mouth See admin instructions. 40 meq in the morning and 20 meq in the bedtime   rivaroxaban 20 MG Tabs tablet Commonly known as: XARELTO Take 1 tablet (20 mg total) by mouth daily with supper.   rosuvastatin 10 MG tablet Commonly known as: CRESTOR Take 10 mg by mouth at bedtime.   traMADol 50 MG tablet Commonly known as: ULTRAM Take 1-2 tablets (50-100 mg total) by mouth every 6 (six) hours as needed for moderate pain.               Discharge Care Instructions  (From admission, onward)           Start     Ordered   08/24/22 0000  Weight bearing as tolerated        08/24/22  0757   08/24/22 0000  Change dressing       Comments: You may remove the bulky bandage (ACE wrap and gauze) two days after surgery. You will have an adhesive waterproof bandage underneath. Leave this in place until your first follow-up appointment.   08/24/22 0757            Follow-up Information     Gaynelle Arabian, MD. Go on 09/08/2022.   Specialty: Orthopedic Surgery Why: You are scheduled for a follow up appointment on 09-08-22 at 4:00 pm. Contact information: [address] [place] [address] [phone]                 Signed: R. Jaynie Bream, PA-C Orthopedic Surgery 08/25/2022, 12:15 PM

## 2022-08-26 DIAGNOSIS — Z96651 Presence of right artificial knee joint: Secondary | ICD-10-CM | POA: Diagnosis not present

## 2022-08-26 DIAGNOSIS — M25561 Pain in right knee: Secondary | ICD-10-CM | POA: Diagnosis not present

## 2022-08-26 DIAGNOSIS — M6281 Muscle weakness (generalized): Secondary | ICD-10-CM | POA: Diagnosis not present

## 2022-08-26 DIAGNOSIS — R262 Difficulty in walking, not elsewhere classified: Secondary | ICD-10-CM | POA: Diagnosis not present

## 2022-08-27 DIAGNOSIS — M6281 Muscle weakness (generalized): Secondary | ICD-10-CM | POA: Diagnosis not present

## 2022-08-27 DIAGNOSIS — M25561 Pain in right knee: Secondary | ICD-10-CM | POA: Diagnosis not present

## 2022-08-27 DIAGNOSIS — Z4789 Encounter for other orthopedic aftercare: Secondary | ICD-10-CM | POA: Diagnosis not present

## 2022-08-27 DIAGNOSIS — R262 Difficulty in walking, not elsewhere classified: Secondary | ICD-10-CM | POA: Diagnosis not present

## 2022-08-27 DIAGNOSIS — Z96651 Presence of right artificial knee joint: Secondary | ICD-10-CM | POA: Diagnosis not present

## 2022-08-31 DIAGNOSIS — Z96651 Presence of right artificial knee joint: Secondary | ICD-10-CM | POA: Diagnosis not present

## 2022-08-31 DIAGNOSIS — M25561 Pain in right knee: Secondary | ICD-10-CM | POA: Diagnosis not present

## 2022-08-31 DIAGNOSIS — Z4789 Encounter for other orthopedic aftercare: Secondary | ICD-10-CM | POA: Diagnosis not present

## 2022-08-31 DIAGNOSIS — M6281 Muscle weakness (generalized): Secondary | ICD-10-CM | POA: Diagnosis not present

## 2022-08-31 DIAGNOSIS — R262 Difficulty in walking, not elsewhere classified: Secondary | ICD-10-CM | POA: Diagnosis not present

## 2022-09-01 DIAGNOSIS — M25561 Pain in right knee: Secondary | ICD-10-CM | POA: Diagnosis not present

## 2022-09-01 DIAGNOSIS — Z4789 Encounter for other orthopedic aftercare: Secondary | ICD-10-CM | POA: Diagnosis not present

## 2022-09-01 DIAGNOSIS — R262 Difficulty in walking, not elsewhere classified: Secondary | ICD-10-CM | POA: Diagnosis not present

## 2022-09-01 DIAGNOSIS — M6281 Muscle weakness (generalized): Secondary | ICD-10-CM | POA: Diagnosis not present

## 2022-09-01 DIAGNOSIS — Z96651 Presence of right artificial knee joint: Secondary | ICD-10-CM | POA: Diagnosis not present

## 2022-09-03 DIAGNOSIS — R262 Difficulty in walking, not elsewhere classified: Secondary | ICD-10-CM | POA: Diagnosis not present

## 2022-09-03 DIAGNOSIS — Z96651 Presence of right artificial knee joint: Secondary | ICD-10-CM | POA: Diagnosis not present

## 2022-09-03 DIAGNOSIS — M25561 Pain in right knee: Secondary | ICD-10-CM | POA: Diagnosis not present

## 2022-09-03 DIAGNOSIS — M6281 Muscle weakness (generalized): Secondary | ICD-10-CM | POA: Diagnosis not present

## 2022-09-03 DIAGNOSIS — Z4789 Encounter for other orthopedic aftercare: Secondary | ICD-10-CM | POA: Diagnosis not present

## 2022-09-06 DIAGNOSIS — M6281 Muscle weakness (generalized): Secondary | ICD-10-CM | POA: Diagnosis not present

## 2022-09-06 DIAGNOSIS — R262 Difficulty in walking, not elsewhere classified: Secondary | ICD-10-CM | POA: Diagnosis not present

## 2022-09-06 DIAGNOSIS — M25561 Pain in right knee: Secondary | ICD-10-CM | POA: Diagnosis not present

## 2022-09-06 DIAGNOSIS — Z96651 Presence of right artificial knee joint: Secondary | ICD-10-CM | POA: Diagnosis not present

## 2022-09-06 DIAGNOSIS — Z4789 Encounter for other orthopedic aftercare: Secondary | ICD-10-CM | POA: Diagnosis not present

## 2022-09-09 DIAGNOSIS — M6281 Muscle weakness (generalized): Secondary | ICD-10-CM | POA: Diagnosis not present

## 2022-09-09 DIAGNOSIS — R262 Difficulty in walking, not elsewhere classified: Secondary | ICD-10-CM | POA: Diagnosis not present

## 2022-09-09 DIAGNOSIS — M25561 Pain in right knee: Secondary | ICD-10-CM | POA: Diagnosis not present

## 2022-09-09 DIAGNOSIS — Z96651 Presence of right artificial knee joint: Secondary | ICD-10-CM | POA: Diagnosis not present

## 2022-09-09 DIAGNOSIS — Z4789 Encounter for other orthopedic aftercare: Secondary | ICD-10-CM | POA: Diagnosis not present

## 2022-09-13 DIAGNOSIS — Z96651 Presence of right artificial knee joint: Secondary | ICD-10-CM | POA: Diagnosis not present

## 2022-09-13 DIAGNOSIS — M25561 Pain in right knee: Secondary | ICD-10-CM | POA: Diagnosis not present

## 2022-09-13 DIAGNOSIS — R262 Difficulty in walking, not elsewhere classified: Secondary | ICD-10-CM | POA: Diagnosis not present

## 2022-09-13 DIAGNOSIS — M6281 Muscle weakness (generalized): Secondary | ICD-10-CM | POA: Diagnosis not present

## 2022-09-13 DIAGNOSIS — Z4789 Encounter for other orthopedic aftercare: Secondary | ICD-10-CM | POA: Diagnosis not present

## 2022-09-16 DIAGNOSIS — M6281 Muscle weakness (generalized): Secondary | ICD-10-CM | POA: Diagnosis not present

## 2022-09-16 DIAGNOSIS — R262 Difficulty in walking, not elsewhere classified: Secondary | ICD-10-CM | POA: Diagnosis not present

## 2022-09-16 DIAGNOSIS — M25561 Pain in right knee: Secondary | ICD-10-CM | POA: Diagnosis not present

## 2022-09-16 DIAGNOSIS — Z4789 Encounter for other orthopedic aftercare: Secondary | ICD-10-CM | POA: Diagnosis not present

## 2022-09-16 DIAGNOSIS — Z96651 Presence of right artificial knee joint: Secondary | ICD-10-CM | POA: Diagnosis not present

## 2022-09-20 DIAGNOSIS — M25561 Pain in right knee: Secondary | ICD-10-CM | POA: Diagnosis not present

## 2022-09-20 DIAGNOSIS — M6281 Muscle weakness (generalized): Secondary | ICD-10-CM | POA: Diagnosis not present

## 2022-09-20 DIAGNOSIS — Z4789 Encounter for other orthopedic aftercare: Secondary | ICD-10-CM | POA: Diagnosis not present

## 2022-09-20 DIAGNOSIS — Z96651 Presence of right artificial knee joint: Secondary | ICD-10-CM | POA: Diagnosis not present

## 2022-09-20 DIAGNOSIS — R262 Difficulty in walking, not elsewhere classified: Secondary | ICD-10-CM | POA: Diagnosis not present

## 2022-09-22 DIAGNOSIS — M1712 Unilateral primary osteoarthritis, left knee: Secondary | ICD-10-CM | POA: Diagnosis not present

## 2022-09-23 DIAGNOSIS — Z96651 Presence of right artificial knee joint: Secondary | ICD-10-CM | POA: Diagnosis not present

## 2022-09-23 DIAGNOSIS — M25561 Pain in right knee: Secondary | ICD-10-CM | POA: Diagnosis not present

## 2022-09-23 DIAGNOSIS — M6281 Muscle weakness (generalized): Secondary | ICD-10-CM | POA: Diagnosis not present

## 2022-09-23 DIAGNOSIS — Z4789 Encounter for other orthopedic aftercare: Secondary | ICD-10-CM | POA: Diagnosis not present

## 2022-09-23 DIAGNOSIS — R262 Difficulty in walking, not elsewhere classified: Secondary | ICD-10-CM | POA: Diagnosis not present

## 2022-09-28 DIAGNOSIS — M25561 Pain in right knee: Secondary | ICD-10-CM | POA: Diagnosis not present

## 2022-09-28 DIAGNOSIS — R262 Difficulty in walking, not elsewhere classified: Secondary | ICD-10-CM | POA: Diagnosis not present

## 2022-09-28 DIAGNOSIS — M6281 Muscle weakness (generalized): Secondary | ICD-10-CM | POA: Diagnosis not present

## 2022-09-28 DIAGNOSIS — Z96651 Presence of right artificial knee joint: Secondary | ICD-10-CM | POA: Diagnosis not present

## 2022-09-28 DIAGNOSIS — Z4789 Encounter for other orthopedic aftercare: Secondary | ICD-10-CM | POA: Diagnosis not present

## 2022-09-30 DIAGNOSIS — Z5189 Encounter for other specified aftercare: Secondary | ICD-10-CM | POA: Diagnosis not present

## 2022-10-01 DIAGNOSIS — Z96651 Presence of right artificial knee joint: Secondary | ICD-10-CM | POA: Diagnosis not present

## 2022-10-01 DIAGNOSIS — Z4789 Encounter for other orthopedic aftercare: Secondary | ICD-10-CM | POA: Diagnosis not present

## 2022-10-01 DIAGNOSIS — M25561 Pain in right knee: Secondary | ICD-10-CM | POA: Diagnosis not present

## 2022-10-01 DIAGNOSIS — R262 Difficulty in walking, not elsewhere classified: Secondary | ICD-10-CM | POA: Diagnosis not present

## 2022-10-01 DIAGNOSIS — M6281 Muscle weakness (generalized): Secondary | ICD-10-CM | POA: Diagnosis not present

## 2022-10-12 DIAGNOSIS — E039 Hypothyroidism, unspecified: Secondary | ICD-10-CM | POA: Diagnosis not present

## 2022-10-12 DIAGNOSIS — E1169 Type 2 diabetes mellitus with other specified complication: Secondary | ICD-10-CM | POA: Diagnosis not present

## 2022-10-12 DIAGNOSIS — E782 Mixed hyperlipidemia: Secondary | ICD-10-CM | POA: Diagnosis not present

## 2022-10-19 ENCOUNTER — Encounter: Payer: Self-pay | Admitting: Internal Medicine

## 2022-10-19 DIAGNOSIS — I10 Essential (primary) hypertension: Secondary | ICD-10-CM | POA: Diagnosis not present

## 2022-10-19 DIAGNOSIS — K625 Hemorrhage of anus and rectum: Secondary | ICD-10-CM | POA: Diagnosis not present

## 2022-10-19 DIAGNOSIS — E039 Hypothyroidism, unspecified: Secondary | ICD-10-CM | POA: Diagnosis not present

## 2022-10-19 DIAGNOSIS — I6521 Occlusion and stenosis of right carotid artery: Secondary | ICD-10-CM | POA: Diagnosis not present

## 2022-10-19 DIAGNOSIS — Z23 Encounter for immunization: Secondary | ICD-10-CM | POA: Diagnosis not present

## 2022-10-19 DIAGNOSIS — Z0000 Encounter for general adult medical examination without abnormal findings: Secondary | ICD-10-CM | POA: Diagnosis not present

## 2022-10-19 DIAGNOSIS — I480 Paroxysmal atrial fibrillation: Secondary | ICD-10-CM | POA: Diagnosis not present

## 2022-10-19 DIAGNOSIS — J302 Other seasonal allergic rhinitis: Secondary | ICD-10-CM | POA: Diagnosis not present

## 2022-10-19 DIAGNOSIS — D490 Neoplasm of unspecified behavior of digestive system: Secondary | ICD-10-CM | POA: Diagnosis not present

## 2022-10-19 DIAGNOSIS — E1169 Type 2 diabetes mellitus with other specified complication: Secondary | ICD-10-CM | POA: Diagnosis not present

## 2022-10-19 DIAGNOSIS — E782 Mixed hyperlipidemia: Secondary | ICD-10-CM | POA: Diagnosis not present

## 2022-11-09 DIAGNOSIS — R195 Other fecal abnormalities: Secondary | ICD-10-CM | POA: Diagnosis not present

## 2022-11-09 DIAGNOSIS — M545 Low back pain, unspecified: Secondary | ICD-10-CM | POA: Diagnosis not present

## 2022-11-09 DIAGNOSIS — R81 Glycosuria: Secondary | ICD-10-CM | POA: Diagnosis not present

## 2022-11-09 DIAGNOSIS — R109 Unspecified abdominal pain: Secondary | ICD-10-CM | POA: Diagnosis not present

## 2022-11-10 ENCOUNTER — Ambulatory Visit: Payer: Medicare Other | Admitting: Cardiology

## 2022-11-11 ENCOUNTER — Encounter: Payer: Self-pay | Admitting: Cardiology

## 2022-11-11 NOTE — Progress Notes (Signed)
Cardiology Office Note  Date: 11/12/2022   ID: [name], [name] 1946/06/05, MRN [id number]  PCP:  Celene Squibb, MD  Cardiologist:  Rozann Lesches, MD Electrophysiologist:  None   Chief Complaint  Patient presents with   Cardiac follow-up    History of Present Illness: Martha Torres Martha Torres is a 76 y.o. female last seen in May.  She is here for a routine visit.  Reports occasional palpitations, typically brief.  No chest pain or increasing breathlessness with typical activities.  She did undergo interval right total knee arthroplasty, still having some discomfort and swelling at times and continues to follow with orthopedic surgeon.  Her left knee arthritis also limits her activity as well.  I reviewed her most recent lab work as noted below.  She does not report any spontaneous bleeding problems on Xarelto.  Ischemic testing from June was low risk as noted below.  Past Medical History:  Diagnosis Date   Carotid artery disease ([place])    Right CEA in 2002; right carotid artery stent 2021   Chronic low back pain    MRI 2014: Spondylolisthesis of L4-L5 with foraminal narrowing, most prominent on the right and facet arthropathy.  L4-L5 surgery planned for 04/2013 with left-sided pedicle screw fixation.   Degenerative joint disease    Depression    Diarrhea, functional    DVT (deep venous thrombosis) (HCC)    Essential hypertension    Gastroesophageal reflux    H/o stricture & hiatal hernia   History of cardiac catheterization    Normal coronaries 2006   History of kidney stones    Hyperlipidemia    Hypothyroidism    MRSA (methicillin resistant Staphylococcus aureus) 05/2013   Neuropathy    right leg   Paroxysmal atrial fibrillation ([place]) 04/2013   PONV (postoperative nausea and vomiting)    Type 2 diabetes mellitus ([place])     Past Surgical History:  Procedure Laterality Date   BIOPSY  02/26/2019   Procedure: BIOPSY;  Surgeon: Danie Binder, MD;  Location: [facility];   Service: Endoscopy;;  random colon bx's   CAROTID ENDARTERECTOMY Right 10/25/2001   Subsequent to episode of syncope   COLONOSCOPY N/A 02/26/2019   Procedure: COLONOSCOPY;  Surgeon: Danie Binder, MD; left colon s/p biopsy, moderate diverticulosis in the rectosigmoid, sigmoid, descending, and transverse colon, external and internal hemorrhoids, no obvious source of diarrhea identified.  Random colon biopsies were benign.     LAMINECTOMY  2000   L5-S1 discectomy and laminectomy; 3 other surgical procedures subsequently performed   [place]  05-23-13   X's 5 surgeries   TOTAL KNEE ARTHROPLASTY Right 08/23/2022   Procedure: TOTAL KNEE ARTHROPLASTY;  Surgeon: Gaynelle Arabian, MD;  Location: [facility] ORS;  Service: Orthopedics;  Laterality: Right;   TRANSCAROTID ARTERY REVASCULARIZATION  Right 04/21/2020   Procedure: TRANSCAROTID ARTERY REVASCULARIZATION RIGHT;  Surgeon: Angelia Mould, MD;  Location: [facility] OR;  Service: Vascular;  Laterality: Right;   VAGINAL HYSTERECTOMY      Current Outpatient Medications  Medication Sig Dispense Refill   Accu-Chek Softclix Lancets lancets 1 each 3 (three) times daily.     acetaminophen (TYLENOL) 650 MG CR tablet Take 650 mg by mouth 2 (two) times daily.     albuterol (VENTOLIN HFA) 108 (90 Base) MCG/ACT inhaler Inhale 1-2 puffs into the lungs every 6 (six) hours as needed for wheezing or shortness of breath.     ALPRAZolam (XANAX) 0.5  MG tablet Take 0.5 mg by mouth at bedtime.     aspirin EC 81 MG tablet Take 81 mg by mouth daily.     atenolol (TENORMIN) 50 MG tablet Take 1 tablet (50 mg total) by mouth 2 (two) times daily. 180 tablet 1   chlorthalidone (HYGROTON) 25 MG tablet Take 25 mg by mouth daily.      diltiazem (CARDIZEM) 30 MG tablet TAKE 1 TABLET BY MOUTH TWICE  DAILY (Patient taking differently: Take 30 mg by mouth 2 (two) times daily.) 180 tablet 3   gabapentin (NEURONTIN) 300 MG capsule Take 300 mg by mouth 2  (two) times daily.     glipiZIDE (GLUCOTROL XL) 5 MG 24 hr tablet Take 5 mg by mouth 2 (two) times daily.      levothyroxine (SYNTHROID, LEVOTHROID) 50 MCG tablet Take 50 mcg by mouth daily before breakfast.      losartan (COZAAR) 100 MG tablet Take 100 mg by mouth daily.     MAGNESIUM PO Take 250 mg by mouth daily.     methocarbamol (ROBAXIN) 500 MG tablet Take 1 tablet (500 mg total) by mouth every 6 (six) hours as needed for muscle spasms. 40 tablet 0   omeprazole (PRILOSEC) 20 MG capsule Take 20 mg by mouth daily.     oxyCODONE (OXY IR/ROXICODONE) 5 MG immediate release tablet Take 1-2 tablets (5-10 mg total) by mouth every 6 (six) hours as needed for severe pain. 42 tablet 0   potassium chloride SA (KLOR-CON) 20 MEQ tablet Take 1-2 tablets (20-40 mEq total) by mouth See admin instructions. 40 meq in the morning and 20 meq in the bedtime     rivaroxaban (XARELTO) 20 MG TABS tablet Take 1 tablet (20 mg total) by mouth daily with supper. 42 tablet 0   rosuvastatin (CRESTOR) 10 MG tablet Take 10 mg by mouth at bedtime.     traMADol (ULTRAM) 50 MG tablet Take 1-2 tablets (50-100 mg total) by mouth every 6 (six) hours as needed for moderate pain. 40 tablet 0   No current facility-administered medications for this visit.   Allergies:  Patient has no known allergies.   ROS:  No syncope, right leg swelling (right TKR)  Physical Exam: VS:  BP 130/64 (BP Location: Left Arm, Patient Position: Sitting, Cuff Size: Large)   Pulse 62   Ht '5\' 2"'$  (1.575 m)   Wt 192 lb (87.1 kg)   BMI 35.12 kg/m , BMI Body mass index is 35.12 kg/m.  Wt Readings from Last 3 Encounters:  11/12/22 192 lb (87.1 kg)  08/23/22 197 lb (89.4 kg)  08/10/22 197 lb (89.4 kg)    General: Patient appears comfortable at rest. HEENT: Conjunctiva and lids normal. Neck: Supple, no elevated JVP or carotid bruits. Lungs: Clear to auscultation, nonlabored breathing at rest. Cardiac: Regular rate and rhythm, no S3 or significant  systolic murmur. Extremities: Mild right ankle edema.  ECG:  An ECG dated 02/10/2022 was personally reviewed today and demonstrated:  Sinus rhythm.  Recent Labwork: 08/24/2022: BUN 15; Creatinine, Ser 0.84; Hemoglobin 12.3; Platelets 224; Potassium 3.8; Sodium 138  10/15/2022: Hemoglobin A1c 6.8%, cholesterol 158, triglycerides 220, HDL 40, LDL 76, BUN 18, creatinine 1.07, potassium 4.3, AST 10, ALT 7, TSH 1.06, platelets 226, hemoglobin 12.2  Other Studies Reviewed Today:  Carotid Dopplers 03/11/2022: Summary:  Right Carotid: There is no evidence of stenosis in the right ICA.   Left Carotid: Velocities in the left ICA are consistent with a 40-59%  stenosis.   Vertebrals:  Bilateral vertebral arteries demonstrate antegrade flow.  Subclavians: Normal flow hemodynamics were seen in bilateral subclavian               arteries.   Lexiscan Myoview 06/04/2022:   Findings are consistent with no ischemia. The study is low risk.   No ST deviation was noted. The ECG was negative for ischemia.   LV perfusion is normal.  Artifact noted from adjacent radiotracer activity within the gut near the basal inferior wall.   Left ventricular function is normal. Nuclear stress EF: 75 %.   Low risk study with no ischemic territories and LVEF 75%.  Assessment and Plan:  1.  Paroxysmal atrial fibrillation with CHA2DS2-VASc score of 6.  She reports occasional palpitations, no prolonged events.  Remains on Xarelto for stroke prophylaxis.  Continue atenolol with as needed use of short acting diltiazem.  I reviewed her recent lab work.  No changes were made today.  2.  Carotid artery disease status post right CEA in 2002 as well as right carotid artery stenting in 2021.  Follow-up carotid Dopplers from March are noted above.  She is asymptomatic.  Continue Crestor.  3.  Essential hypertension, blood pressure is reasonably well controlled today on current regimen.  No changes were made.  Medication  Adjustments/Labs and Tests Ordered: Current medicines are reviewed at length with the patient today.  Concerns regarding medicines are outlined above.   Tests Ordered: No orders of the defined types were placed in this encounter.   Medication Changes: No orders of the defined types were placed in this encounter.   Disposition:  Follow up  6 months.  Signed, Satira Sark, MD, [facility] 11/12/2022 10:37 AM    [place] [facility] [place], [place], [place] [address] Phone: [phone]; Fax: [phone]

## 2022-11-12 ENCOUNTER — Ambulatory Visit: Payer: Medicare Other | Attending: Cardiology | Admitting: Cardiology

## 2022-11-12 ENCOUNTER — Encounter: Payer: Self-pay | Admitting: Cardiology

## 2022-11-12 VITALS — BP 130/64 | HR 62 | Ht 62.0 in | Wt 192.0 lb

## 2022-11-12 DIAGNOSIS — I480 Paroxysmal atrial fibrillation: Secondary | ICD-10-CM

## 2022-11-12 DIAGNOSIS — I6523 Occlusion and stenosis of bilateral carotid arteries: Secondary | ICD-10-CM | POA: Diagnosis not present

## 2022-11-12 DIAGNOSIS — I10 Essential (primary) hypertension: Secondary | ICD-10-CM | POA: Diagnosis not present

## 2022-11-12 MED ORDER — RIVAROXABAN 20 MG PO TABS: 20.0000 mg | ORAL_TABLET | Freq: Every day | ORAL | 0 refills | Status: AC

## 2022-11-12 NOTE — Addendum Note (Signed)
Addended by: Laurine Blazer on: 11/12/2022 11:52 AM   Modules accepted: Orders

## 2022-11-12 NOTE — Patient Instructions (Addendum)
Medication Instructions:  Continue all current medications.   Labwork: none  Testing/Procedures: none  Follow-Up: 6 months   Any Other Special Instructions Will Be Listed Below (If Applicable).   If you need a refill on your cardiac medications before your next appointment, please call your pharmacy.  

## 2022-11-17 ENCOUNTER — Other Ambulatory Visit (HOSPITAL_COMMUNITY): Payer: Self-pay | Admitting: Family Medicine

## 2022-11-17 DIAGNOSIS — R109 Unspecified abdominal pain: Secondary | ICD-10-CM

## 2022-11-22 ENCOUNTER — Telehealth: Payer: Self-pay | Admitting: Cardiology

## 2022-11-22 NOTE — Telephone Encounter (Signed)
Reports BLE for a while since August 2023, having right knee replacement. Reports pain and redness in both lower legs since knee surgery. Denies drainage. Denies SOB or weight gain. Reports losing weight. Confirmed with patient that she is taking chlorthalidone 25 mg daily and potassium 20 meq (40 meq in the am and 20 meq in the pm). Says her orthopedic doctor doesn't think chlorthalidone is enough to help get fluid off.

## 2022-11-22 NOTE — Telephone Encounter (Signed)
New Message:     Patient says her Orthopedic doctor says she needs a fluid pill, because of swelling in her legs. She wants to know if Dr Domenic Polite would write her a prescription for a fluid pill please?

## 2022-11-23 MED ORDER — FUROSEMIDE 20 MG PO TABS: ORAL_TABLET | ORAL | 0 refills | Status: DC

## 2022-11-23 NOTE — Telephone Encounter (Signed)
Patient made aware, verbalized understanding. Rx for furosemide 20 mg, once a day as needed, Qty 30-sent to [facility] per patient request.

## 2022-11-23 NOTE — Telephone Encounter (Signed)
Patient is returning call.  °

## 2022-12-03 ENCOUNTER — Ambulatory Visit (HOSPITAL_COMMUNITY)
Admission: RE | Admit: 2022-12-03 | Discharge: 2022-12-03 | Disposition: A | Discharge status: Home / Self Care | Payer: Medicare Other | Source: Ambulatory Visit | Attending: Family Medicine | Admitting: Family Medicine

## 2022-12-03 ENCOUNTER — Encounter (HOSPITAL_COMMUNITY): Payer: Self-pay

## 2022-12-03 DIAGNOSIS — R109 Unspecified abdominal pain: Secondary | ICD-10-CM | POA: Insufficient documentation

## 2022-12-03 DIAGNOSIS — I700 Atherosclerosis of aorta: Secondary | ICD-10-CM | POA: Diagnosis not present

## 2022-12-03 MED ORDER — IOHEXOL 9 MG/ML PO SOLN
ORAL | Status: AC
  Filled 2022-12-03: qty 1000

## 2022-12-07 DIAGNOSIS — M1712 Unilateral primary osteoarthritis, left knee: Secondary | ICD-10-CM | POA: Diagnosis not present

## 2022-12-31 DIAGNOSIS — H35033 Hypertensive retinopathy, bilateral: Secondary | ICD-10-CM | POA: Diagnosis not present

## 2022-12-31 DIAGNOSIS — H524 Presbyopia: Secondary | ICD-10-CM | POA: Diagnosis not present

## 2023-02-03 ENCOUNTER — Encounter (HOSPITAL_COMMUNITY): Payer: Self-pay | Admitting: *Deleted

## 2023-02-08 ENCOUNTER — Other Ambulatory Visit: Payer: Self-pay | Admitting: Cardiology

## 2023-02-16 DIAGNOSIS — E1169 Type 2 diabetes mellitus with other specified complication: Secondary | ICD-10-CM | POA: Diagnosis not present

## 2023-02-16 DIAGNOSIS — E039 Hypothyroidism, unspecified: Secondary | ICD-10-CM | POA: Diagnosis not present

## 2023-02-16 DIAGNOSIS — E782 Mixed hyperlipidemia: Secondary | ICD-10-CM | POA: Diagnosis not present

## 2023-02-17 DIAGNOSIS — Z96652 Presence of left artificial knee joint: Secondary | ICD-10-CM | POA: Diagnosis not present

## 2023-02-17 DIAGNOSIS — Z5189 Encounter for other specified aftercare: Secondary | ICD-10-CM | POA: Diagnosis not present

## 2023-02-21 DIAGNOSIS — J302 Other seasonal allergic rhinitis: Secondary | ICD-10-CM | POA: Diagnosis not present

## 2023-02-21 DIAGNOSIS — K219 Gastro-esophageal reflux disease without esophagitis: Secondary | ICD-10-CM | POA: Diagnosis not present

## 2023-02-21 DIAGNOSIS — I6521 Occlusion and stenosis of right carotid artery: Secondary | ICD-10-CM | POA: Diagnosis not present

## 2023-02-21 DIAGNOSIS — D490 Neoplasm of unspecified behavior of digestive system: Secondary | ICD-10-CM | POA: Diagnosis not present

## 2023-02-21 DIAGNOSIS — E782 Mixed hyperlipidemia: Secondary | ICD-10-CM | POA: Diagnosis not present

## 2023-02-21 DIAGNOSIS — I480 Paroxysmal atrial fibrillation: Secondary | ICD-10-CM | POA: Diagnosis not present

## 2023-02-21 DIAGNOSIS — I10 Essential (primary) hypertension: Secondary | ICD-10-CM | POA: Diagnosis not present

## 2023-02-21 DIAGNOSIS — K625 Hemorrhage of anus and rectum: Secondary | ICD-10-CM | POA: Diagnosis not present

## 2023-02-21 DIAGNOSIS — E039 Hypothyroidism, unspecified: Secondary | ICD-10-CM | POA: Diagnosis not present

## 2023-03-07 DIAGNOSIS — Z79899 Other long term (current) drug therapy: Secondary | ICD-10-CM | POA: Diagnosis not present

## 2023-03-07 DIAGNOSIS — I4891 Unspecified atrial fibrillation: Secondary | ICD-10-CM | POA: Diagnosis not present

## 2023-03-07 DIAGNOSIS — Z7982 Long term (current) use of aspirin: Secondary | ICD-10-CM | POA: Diagnosis not present

## 2023-03-07 DIAGNOSIS — E119 Type 2 diabetes mellitus without complications: Secondary | ICD-10-CM | POA: Diagnosis not present

## 2023-03-07 DIAGNOSIS — I10 Essential (primary) hypertension: Secondary | ICD-10-CM | POA: Diagnosis not present

## 2023-03-07 DIAGNOSIS — M11262 Other chondrocalcinosis, left knee: Secondary | ICD-10-CM | POA: Diagnosis not present

## 2023-03-07 DIAGNOSIS — Z743 Need for continuous supervision: Secondary | ICD-10-CM | POA: Diagnosis not present

## 2023-03-07 DIAGNOSIS — Z7984 Long term (current) use of oral hypoglycemic drugs: Secondary | ICD-10-CM | POA: Diagnosis not present

## 2023-03-07 DIAGNOSIS — R6889 Other general symptoms and signs: Secondary | ICD-10-CM | POA: Diagnosis not present

## 2023-03-07 DIAGNOSIS — M25562 Pain in left knee: Secondary | ICD-10-CM | POA: Diagnosis not present

## 2023-03-10 DIAGNOSIS — M11262 Other chondrocalcinosis, left knee: Secondary | ICD-10-CM | POA: Diagnosis not present

## 2023-03-10 DIAGNOSIS — M1712 Unilateral primary osteoarthritis, left knee: Secondary | ICD-10-CM | POA: Diagnosis not present

## 2023-03-24 DIAGNOSIS — M19011 Primary osteoarthritis, right shoulder: Secondary | ICD-10-CM | POA: Diagnosis not present

## 2023-03-24 DIAGNOSIS — M19012 Primary osteoarthritis, left shoulder: Secondary | ICD-10-CM | POA: Diagnosis not present

## 2023-05-25 DIAGNOSIS — E039 Hypothyroidism, unspecified: Secondary | ICD-10-CM | POA: Diagnosis not present

## 2023-05-25 DIAGNOSIS — E1169 Type 2 diabetes mellitus with other specified complication: Secondary | ICD-10-CM | POA: Diagnosis not present

## 2023-05-25 DIAGNOSIS — E782 Mixed hyperlipidemia: Secondary | ICD-10-CM | POA: Diagnosis not present

## 2023-05-26 DIAGNOSIS — M1712 Unilateral primary osteoarthritis, left knee: Secondary | ICD-10-CM | POA: Diagnosis not present

## 2023-05-26 DIAGNOSIS — M11262 Other chondrocalcinosis, left knee: Secondary | ICD-10-CM | POA: Diagnosis not present

## 2023-06-01 DIAGNOSIS — K219 Gastro-esophageal reflux disease without esophagitis: Secondary | ICD-10-CM | POA: Diagnosis not present

## 2023-06-01 DIAGNOSIS — I10 Essential (primary) hypertension: Secondary | ICD-10-CM | POA: Diagnosis not present

## 2023-06-01 DIAGNOSIS — E1159 Type 2 diabetes mellitus with other circulatory complications: Secondary | ICD-10-CM | POA: Diagnosis not present

## 2023-06-01 DIAGNOSIS — E039 Hypothyroidism, unspecified: Secondary | ICD-10-CM | POA: Diagnosis not present

## 2023-06-01 DIAGNOSIS — M170 Bilateral primary osteoarthritis of knee: Secondary | ICD-10-CM | POA: Diagnosis not present

## 2023-06-01 DIAGNOSIS — E1169 Type 2 diabetes mellitus with other specified complication: Secondary | ICD-10-CM | POA: Diagnosis not present

## 2023-06-01 DIAGNOSIS — I6521 Occlusion and stenosis of right carotid artery: Secondary | ICD-10-CM | POA: Diagnosis not present

## 2023-06-01 DIAGNOSIS — I480 Paroxysmal atrial fibrillation: Secondary | ICD-10-CM | POA: Diagnosis not present

## 2023-06-01 DIAGNOSIS — E1140 Type 2 diabetes mellitus with diabetic neuropathy, unspecified: Secondary | ICD-10-CM | POA: Diagnosis not present

## 2023-06-01 DIAGNOSIS — E782 Mixed hyperlipidemia: Secondary | ICD-10-CM | POA: Diagnosis not present

## 2023-06-05 NOTE — Progress Notes (Unsigned)
    Cardiology Office Note  Date: 06/05/2023   ID: Martha Torres, DOB [birth year], MRN [id number]  History of Present Illness: Martha Torres is a 77 y.o. female last seen in November 2023.  Physical Exam: VS:  There were no vitals taken for this visit., BMI There is no height or weight on file to calculate BMI.  Wt Readings from Last 3 Encounters:  11/12/22 192 lb (87.1 kg)  08/23/22 197 lb (89.4 kg)  08/10/22 197 lb (89.4 kg)    General: Patient appears comfortable at rest. HEENT: Conjunctiva and lids normal, oropharynx clear with moist mucosa. Neck: Supple, no elevated JVP or carotid bruits, no thyromegaly. Lungs: Clear to auscultation, nonlabored breathing at rest. Cardiac: Regular rate and rhythm, no S3 or significant systolic murmur, no pericardial rub. Abdomen: Soft, nontender, no hepatomegaly, bowel sounds present, no guarding or rebound. Extremities: No pitting edema, distal pulses 2+. Skin: Warm and dry. Musculoskeletal: No kyphosis. Neuropsychiatric: Alert and oriented x3, affect grossly appropriate.  ECG:  An ECG dated 02/10/2022 was personally reviewed today and demonstrated:  Sinus rhythm.  Labwork: 08/24/2022: BUN 15; Creatinine, Ser 0.84; Hemoglobin 12.3; Platelets 224; Potassium 3.8; Sodium 138  May 2024: Hemoglobin A1c 6.8%, cholesterol 148, triglycerides 159, HDL 43, LDL 78, BUN 13, creatinine 0.93, potassium 4.5, AST 14, ALT 14, hemoglobin 11.5, platelets 268, TSH 1.25  Other Studies Reviewed Today:  Lexiscan Myoview 06/04/2022:   Findings are consistent with no ischemia. The study is low risk.   No ST deviation was noted. The ECG was negative for ischemia.   LV perfusion is normal.  Artifact noted from adjacent radiotracer activity within the gut near the basal inferior wall.   Left ventricular function is normal. Nuclear stress EF: 75 %.   Low risk study with no ischemic territories and LVEF 75%.  Assessment and Plan:  1.  Paroxysmal atrial fibrillation  with CHA2DS2-VASc score of 6.  2.  Carotid artery disease status post right CEA in 2002 and subsequent right carotid artery stenting in 2021.  Carotid Dopplers in March 2023 revealed no significant RICA stenosis and 40 to 59% LICA stenosis.  She follows with Dr. Edilia Bo at [facility].  3.  Essential hypertension.  4.  Mixed hyperlipidemia, LDL 78 in May on Crestor 10 mg daily.  Disposition:  Follow up {follow up:15908}  Signed, Jonelle Sidle, M.D., F.A.C.C. [facility]

## 2023-06-06 ENCOUNTER — Ambulatory Visit: Payer: Medicare Other | Attending: Cardiology | Admitting: Cardiology

## 2023-06-06 ENCOUNTER — Encounter: Payer: Self-pay | Admitting: Cardiology

## 2023-06-06 VITALS — BP 148/78 | HR 64 | Ht 65.0 in | Wt 194.6 lb

## 2023-06-06 DIAGNOSIS — I6523 Occlusion and stenosis of bilateral carotid arteries: Secondary | ICD-10-CM

## 2023-06-06 DIAGNOSIS — E782 Mixed hyperlipidemia: Secondary | ICD-10-CM | POA: Diagnosis not present

## 2023-06-06 DIAGNOSIS — I480 Paroxysmal atrial fibrillation: Secondary | ICD-10-CM | POA: Diagnosis not present

## 2023-06-06 MED ORDER — ROSUVASTATIN CALCIUM 20 MG PO TABS: 20.0000 mg | ORAL_TABLET | Freq: Every day | ORAL | 2 refills | Status: DC

## 2023-06-06 NOTE — Patient Instructions (Addendum)
Medication Instructions:  Your physician has recommended you make the following change in your medication:  Increase rosuvastatin 20 mg daily Continue all other medications the same  Labwork: none  Testing/Procedures: none  Follow-Up: Your physician recommends that you schedule a follow-up appointment in: 6 months  Any Other Special Instructions Will Be Listed Below (If Applicable).  If you need a refill on your cardiac medications before your next appointment, please call your pharmacy.

## 2023-08-02 DIAGNOSIS — M1712 Unilateral primary osteoarthritis, left knee: Secondary | ICD-10-CM | POA: Diagnosis not present

## 2023-08-02 DIAGNOSIS — M11262 Other chondrocalcinosis, left knee: Secondary | ICD-10-CM | POA: Diagnosis not present

## 2023-08-19 ENCOUNTER — Other Ambulatory Visit: Payer: Self-pay

## 2023-08-19 DIAGNOSIS — I6523 Occlusion and stenosis of bilateral carotid arteries: Secondary | ICD-10-CM

## 2023-08-26 DIAGNOSIS — M19012 Primary osteoarthritis, left shoulder: Secondary | ICD-10-CM | POA: Diagnosis not present

## 2023-08-26 DIAGNOSIS — M19011 Primary osteoarthritis, right shoulder: Secondary | ICD-10-CM | POA: Diagnosis not present

## 2023-08-26 DIAGNOSIS — M11262 Other chondrocalcinosis, left knee: Secondary | ICD-10-CM | POA: Diagnosis not present

## 2023-08-26 DIAGNOSIS — M1712 Unilateral primary osteoarthritis, left knee: Secondary | ICD-10-CM | POA: Diagnosis not present

## 2023-08-31 IMAGING — DX DG CHEST 1V PORT
1 series · 1 of 1 positions shown · non-contrast
Comparison: Chest radiograph 04/30/2021.

CLINICAL DATA: Shortness of breath.

EXAM:
PORTABLE CHEST 1 VIEW

[chest ap]
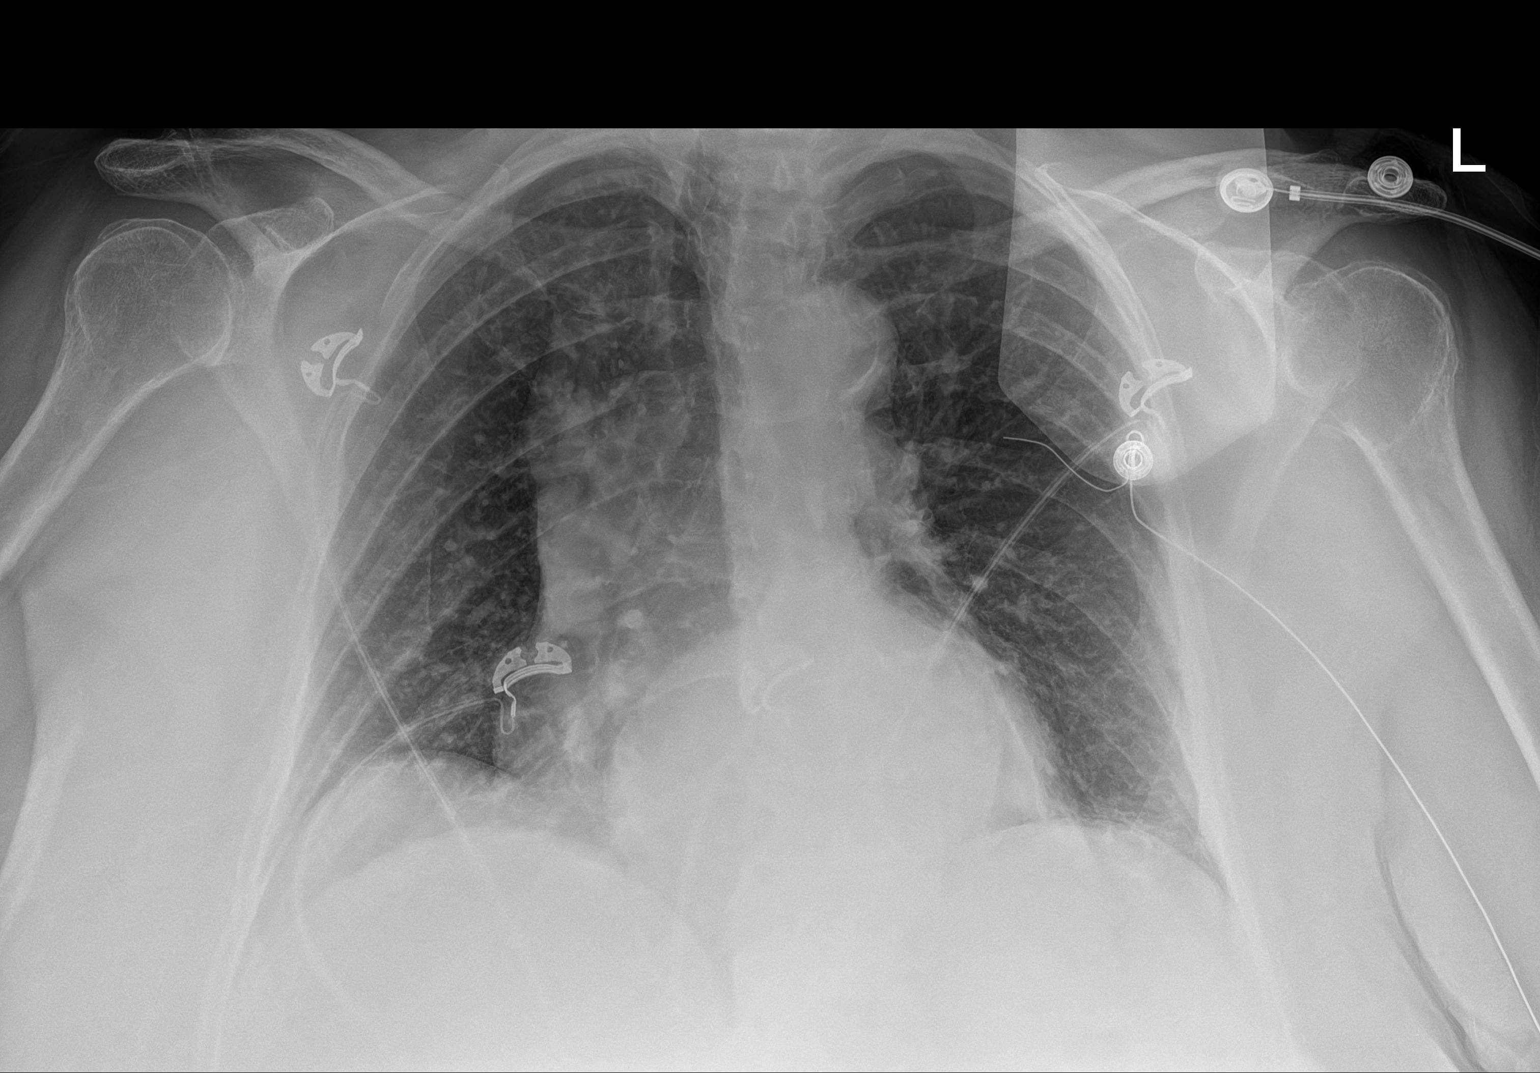

[1 of 1 positions shown; findings below may reference images not displayed]

FINDINGS: Stable cardiomegaly. Unchanged mediastinal contours with aortic
atherosclerosis. Again seen retrocardiac hiatal hernia. Subsegmental
atelectasis at the left lung base. No confluent consolidation,
pleural effusion, or pneumothorax. Patient is rotated accounting for
right tracheal positioning. No acute osseous abnormalities are seen.
IMPRESSION: Stable cardiomegaly and retrocardiac hiatal hernia. Subsegmental
left basilar atelectasis.

## 2023-09-01 ENCOUNTER — Ambulatory Visit (HOSPITAL_COMMUNITY)
Admission: RE | Admit: 2023-09-01 | Discharge: 2023-09-01 | Disposition: A | Discharge status: Home / Self Care | Payer: Medicare Other | Source: Ambulatory Visit | Attending: Vascular Surgery | Admitting: Vascular Surgery

## 2023-09-01 ENCOUNTER — Encounter: Payer: Self-pay | Admitting: Vascular Surgery

## 2023-09-01 ENCOUNTER — Ambulatory Visit: Payer: Medicare Other | Admitting: Vascular Surgery

## 2023-09-01 VITALS — BP 164/75 | HR 54 | Temp 97.9°F | Resp 20 | Ht 65.0 in | Wt 192.8 lb

## 2023-09-01 DIAGNOSIS — I6523 Occlusion and stenosis of bilateral carotid arteries: Secondary | ICD-10-CM

## 2023-09-01 NOTE — Progress Notes (Signed)
REASON FOR VISIT:   Follow-up of carotid disease.  MEDICAL ISSUES:   BILATERAL CAROTID DISEASE: This patient had a right carotid endarterectomy in 2002 and there right TCAR in 2021 for recurrent stenosis.  Her right carotid stent is widely patent.  She has a 40 to 59% stenosis on the left.  She is asymptomatic.  I recommended a follow-up carotid duplex scan in 1 year and we will see her back at that time.  I have explained that I will be retiring so she will be seen on the PA schedule at that time.  She is on aspirin and is on a statin.  She quit smoking in 2009.  She knows to call sooner if she has problems.   HPI:   Martha Torres is a pleasant 77 y.o. female who I last saw on 05/26/2022.  She underwent a right carotid endarterectomy in 2002 by Dr. Madilyn Fireman.  She had a recurrent right carotid stenosis and underwent a TCAR on 04/21/2020 by Dr. Randie Heinz.   Since I saw her last she is still recovering from her right knee replacement but is making progress.  She denies any history of stroke, TIAs, expressive or receptive aphasia, or amaurosis fugax.  She is on Xarelto for A-fib.  She is on aspirin and a statin.  She quit smoking in 2009.  Past Medical History:  Diagnosis Date   Carotid artery disease (HCC)    Right CEA in 2002; right carotid artery stent 2021   Chronic low back pain    MRI 2014: Spondylolisthesis of L4-L5 with foraminal narrowing, most prominent on the right and facet arthropathy.  L4-L5 surgery planned for 04/2013 with left-sided pedicle screw fixation.   Degenerative joint disease    Depression    Diarrhea, functional    DVT (deep venous thrombosis) (HCC)    Essential hypertension    Gastroesophageal reflux    H/o stricture & hiatal hernia   History of cardiac catheterization    Normal coronaries 2006   History of kidney stones    Hyperlipidemia    Hypothyroidism    MRSA (methicillin resistant Staphylococcus aureus) 05/2013   Neuropathy    right leg   Paroxysmal  atrial fibrillation (HCC) 04/2013   PONV (postoperative nausea and vomiting)    Type 2 diabetes mellitus (HCC)     Family History  Problem Relation Age of Onset   Heart attack Father        Also mother   Cancer - Prostate Father    Heart disease Father        Heart Disease before age 77   Hyperlipidemia Father    Hypertension Sister    Hyperlipidemia Sister    Cancer Sister    Stroke Mother    Heart disease Mother        Heart Disease before age 13   Hyperlipidemia Mother    Heart attack Mother    Diabetes Sister    Hyperlipidemia Sister    Hypertension Sister    Hyperlipidemia Brother    Cancer Brother        Luekemia   Hypertension Brother    Colon cancer Neg Hx     SOCIAL HISTORY: Social History   Tobacco Use   Smoking status: Former    Current packs/day: 0.00    Average packs/day: 0.2 packs/day for 43.7 years (10.9 ttl pk-yrs)    Types: Cigarettes    Start date: 03/10/1965    Quit date: 11/06/2008  Years since quitting: 14.8   Smokeless tobacco: Never  Substance Use Topics   Alcohol use: Never    Alcohol/week: 1.0 standard drink of alcohol    Types: 1 Standard drinks or equivalent per week    No Known Allergies  Current Outpatient Medications  Medication Sig Dispense Refill   Accu-Chek Softclix Lancets lancets 1 each 3 (three) times daily.     acetaminophen (TYLENOL) 650 MG CR tablet Take 650 mg by mouth 2 (two) times daily.     albuterol (VENTOLIN HFA) 108 (90 Base) MCG/ACT inhaler Inhale 1-2 puffs into the lungs every 6 (six) hours as needed for wheezing or shortness of breath.     ALPRAZolam (XANAX) 0.5 MG tablet Take 0.5 mg by mouth at bedtime.     aspirin EC 81 MG tablet Take 81 mg by mouth daily.     atenolol (TENORMIN) 50 MG tablet Take 1 tablet (50 mg total) by mouth 2 (two) times daily. 180 tablet 1   chlorthalidone (HYGROTON) 25 MG tablet Take 25 mg by mouth daily.      diltiazem (CARDIZEM) 30 MG tablet TAKE 1 TABLET BY MOUTH TWICE  DAILY 200  tablet 2   furosemide (LASIX) 20 MG tablet Once a day as needed for swelling 30 tablet 0   gabapentin (NEURONTIN) 300 MG capsule Take 300 mg by mouth 2 (two) times daily.     glipiZIDE (GLUCOTROL XL) 5 MG 24 hr tablet Take 5 mg by mouth 2 (two) times daily.      levothyroxine (SYNTHROID, LEVOTHROID) 50 MCG tablet Take 50 mcg by mouth daily before breakfast.      losartan (COZAAR) 100 MG tablet Take 100 mg by mouth daily.     omeprazole (PRILOSEC) 20 MG capsule Take 20 mg by mouth daily.     potassium chloride SA (KLOR-CON) 20 MEQ tablet Take 1-2 tablets (20-40 mEq total) by mouth See admin instructions. 40 meq in the morning and 20 meq in the bedtime     rivaroxaban (XARELTO) 20 MG TABS tablet Take 1 tablet (20 mg total) by mouth daily with supper. 28 tablet 0   rosuvastatin (CRESTOR) 20 MG tablet Take 1 tablet (20 mg total) by mouth daily. 90 tablet 2   No current facility-administered medications for this visit.    REVIEW OF SYSTEMS:  [X]  denotes positive finding, [ ]  denotes negative finding Cardiac  Comments:  Chest pain or chest pressure:    Shortness of breath upon exertion:    Short of breath when lying flat:    Irregular heart rhythm:        Vascular    Pain in calf, thigh, or hip brought on by ambulation:    Pain in feet at night that wakes you up from your sleep:     Blood clot in your veins:    Leg swelling:         Pulmonary    Oxygen at home:    Productive cough:     Wheezing:         Neurologic    Sudden weakness in arms or legs:     Sudden numbness in arms or legs:     Sudden onset of difficulty speaking or slurred speech:    Temporary loss of vision in one eye:     Problems with dizziness:         Gastrointestinal    Blood in stool:     Vomited blood:  Genitourinary    Burning when urinating:     Blood in urine:        Psychiatric    Major depression:         Hematologic    Bleeding problems:    Problems with blood clotting too easily:         Skin    Rashes or ulcers:        Constitutional    Fever or chills:     PHYSICAL EXAM:   Vitals:   09/01/23 0824 09/01/23 0825  BP: (!) 148/84 (!) 164/75  Pulse: (!) 54   Resp: 20   Temp: 97.9 F (36.6 C)   SpO2: 98%   Weight: 192 lb 12.8 oz (87.5 kg)   Height: 5\' 5"  (1.651 m)     GENERAL: The patient is a well-nourished female, in no acute distress. The vital signs are documented above. CARDIAC: There is a regular rate and rhythm.  VASCULAR: I do not detect carotid bruits. PULMONARY: There is good air exchange bilaterally without wheezing or rales.  MUSCULOSKELETAL: There are no major deformities or cyanosis. NEUROLOGIC: No focal weakness or paresthesias are detected. SKIN: There are no ulcers or rashes noted. PSYCHIATRIC: The patient has a normal affect.  DATA:    CAROTID DUPLEX: I have independently interpreted her carotid duplex scan today.  On the right side the right carotid stent is widely patent.  The right vertebral artery is patent with antegrade flow.  On the left side there is a 40 to 59% stenosis.  The left vertebral artery is patent with antegrade flow.  Waverly Ferrari [facility] [phone]

## 2023-09-13 ENCOUNTER — Other Ambulatory Visit: Payer: Self-pay

## 2023-09-13 ENCOUNTER — Telehealth: Payer: Self-pay | Admitting: Cardiology

## 2023-09-13 DIAGNOSIS — I6523 Occlusion and stenosis of bilateral carotid arteries: Secondary | ICD-10-CM

## 2023-09-13 NOTE — Telephone Encounter (Signed)
Patient called and wanted to know if there was another alternative to taking Xarelto due to the cost going up to over $500. Want to know about samples and assistance as well

## 2023-09-13 NOTE — Telephone Encounter (Signed)
Left message to return call 

## 2023-09-14 ENCOUNTER — Telehealth: Payer: Self-pay | Admitting: Cardiology

## 2023-09-14 NOTE — Telephone Encounter (Signed)
Pt c/o medication issue:  1. Name of Medication:   rivaroxaban (XARELTO) 20 MG TABS tablet   2. How are you currently taking this medication (dosage and times per day)?   As prescribed  3. Are you having a reaction (difficulty breathing--STAT)?   No  4. What is your medication issue?   Patient stated her medication is now $450-something and she is in donut hole and wants to get assistance with this medication or alternate medication.

## 2023-09-14 NOTE — Telephone Encounter (Signed)
Called but no answer. Will advise patient that [facility] may be a better option for xarelto availability. Will explain process when she calls back.

## 2023-09-14 NOTE — Telephone Encounter (Signed)
Called patient back with provider instructions: Other option would be Eliquis, although this is also likely to be affected by the donut hole and we still may need to check into assistance.   Patient had called back to [facility] to see if they would be able to fill Xarelto. Stated that somehow they had it for $143.00 and was a lot better than $450. Advised her that if she needed further assistance she can let us know. Patient verbalized understanding

## 2023-09-14 NOTE — Telephone Encounter (Signed)
Called patient advised her that we can offer patient assistance to help with the cost of medication and being in the doughnut hole. She stated that she may come by, but would prefer if there was something else she can take in the place of Xarelto due to every time she tries to pick up from the pharmacy they either don't have it or give her a certain amount. Also offered to change pharmacies, still wanted to inquire about other options for replacement. Please Advise.

## 2023-09-16 NOTE — Telephone Encounter (Signed)
Advised that she has the option to use [facility] to prevent delays in getting her xarelto and issues she has each month with [place]. Advised that xarelto rx would be mailed directly to her home address. Verbalized understanding and says she will think about it and contact our office if she decides to switch to [facility].

## 2023-09-27 DIAGNOSIS — E039 Hypothyroidism, unspecified: Secondary | ICD-10-CM | POA: Diagnosis not present

## 2023-09-27 DIAGNOSIS — E1169 Type 2 diabetes mellitus with other specified complication: Secondary | ICD-10-CM | POA: Diagnosis not present

## 2023-09-27 DIAGNOSIS — E782 Mixed hyperlipidemia: Secondary | ICD-10-CM | POA: Diagnosis not present

## 2023-10-04 ENCOUNTER — Other Ambulatory Visit (HOSPITAL_COMMUNITY): Payer: Self-pay | Admitting: Internal Medicine

## 2023-10-04 DIAGNOSIS — Z1231 Encounter for screening mammogram for malignant neoplasm of breast: Secondary | ICD-10-CM

## 2023-10-04 DIAGNOSIS — I480 Paroxysmal atrial fibrillation: Secondary | ICD-10-CM | POA: Diagnosis not present

## 2023-10-04 DIAGNOSIS — I6521 Occlusion and stenosis of right carotid artery: Secondary | ICD-10-CM | POA: Diagnosis not present

## 2023-10-04 DIAGNOSIS — E1169 Type 2 diabetes mellitus with other specified complication: Secondary | ICD-10-CM | POA: Diagnosis not present

## 2023-10-04 DIAGNOSIS — J302 Other seasonal allergic rhinitis: Secondary | ICD-10-CM | POA: Diagnosis not present

## 2023-10-04 DIAGNOSIS — E782 Mixed hyperlipidemia: Secondary | ICD-10-CM | POA: Diagnosis not present

## 2023-10-04 DIAGNOSIS — Z13820 Encounter for screening for osteoporosis: Secondary | ICD-10-CM

## 2023-10-04 DIAGNOSIS — I10 Essential (primary) hypertension: Secondary | ICD-10-CM | POA: Diagnosis not present

## 2023-10-04 DIAGNOSIS — K625 Hemorrhage of anus and rectum: Secondary | ICD-10-CM | POA: Diagnosis not present

## 2023-10-04 DIAGNOSIS — Z23 Encounter for immunization: Secondary | ICD-10-CM | POA: Diagnosis not present

## 2023-10-04 DIAGNOSIS — E1140 Type 2 diabetes mellitus with diabetic neuropathy, unspecified: Secondary | ICD-10-CM | POA: Diagnosis not present

## 2023-10-04 DIAGNOSIS — D490 Neoplasm of unspecified behavior of digestive system: Secondary | ICD-10-CM | POA: Diagnosis not present

## 2023-10-04 DIAGNOSIS — E039 Hypothyroidism, unspecified: Secondary | ICD-10-CM | POA: Diagnosis not present

## 2023-10-18 DIAGNOSIS — M19011 Primary osteoarthritis, right shoulder: Secondary | ICD-10-CM | POA: Diagnosis not present

## 2023-10-18 DIAGNOSIS — M1712 Unilateral primary osteoarthritis, left knee: Secondary | ICD-10-CM | POA: Diagnosis not present

## 2023-10-18 DIAGNOSIS — M19012 Primary osteoarthritis, left shoulder: Secondary | ICD-10-CM | POA: Diagnosis not present

## 2023-10-18 DIAGNOSIS — M11262 Other chondrocalcinosis, left knee: Secondary | ICD-10-CM | POA: Diagnosis not present

## 2023-10-22 ENCOUNTER — Other Ambulatory Visit: Payer: Self-pay | Admitting: Cardiology

## 2023-10-28 DIAGNOSIS — M19011 Primary osteoarthritis, right shoulder: Secondary | ICD-10-CM | POA: Diagnosis not present

## 2023-10-28 DIAGNOSIS — M19012 Primary osteoarthritis, left shoulder: Secondary | ICD-10-CM | POA: Diagnosis not present

## 2023-11-04 DIAGNOSIS — I10 Essential (primary) hypertension: Secondary | ICD-10-CM | POA: Diagnosis not present

## 2023-11-04 DIAGNOSIS — Z7901 Long term (current) use of anticoagulants: Secondary | ICD-10-CM | POA: Diagnosis not present

## 2023-11-04 DIAGNOSIS — I480 Paroxysmal atrial fibrillation: Secondary | ICD-10-CM | POA: Diagnosis not present

## 2023-11-04 DIAGNOSIS — R0989 Other specified symptoms and signs involving the circulatory and respiratory systems: Secondary | ICD-10-CM | POA: Diagnosis not present

## 2023-11-04 DIAGNOSIS — Z7984 Long term (current) use of oral hypoglycemic drugs: Secondary | ICD-10-CM | POA: Diagnosis not present

## 2023-11-04 DIAGNOSIS — R079 Chest pain, unspecified: Secondary | ICD-10-CM | POA: Diagnosis not present

## 2023-11-04 DIAGNOSIS — R0600 Dyspnea, unspecified: Secondary | ICD-10-CM | POA: Diagnosis not present

## 2023-11-04 DIAGNOSIS — I4891 Unspecified atrial fibrillation: Secondary | ICD-10-CM | POA: Diagnosis not present

## 2023-11-04 DIAGNOSIS — R9431 Abnormal electrocardiogram [ECG] [EKG]: Secondary | ICD-10-CM | POA: Diagnosis not present

## 2023-11-04 DIAGNOSIS — E119 Type 2 diabetes mellitus without complications: Secondary | ICD-10-CM | POA: Diagnosis not present

## 2023-11-04 DIAGNOSIS — E782 Mixed hyperlipidemia: Secondary | ICD-10-CM | POA: Diagnosis not present

## 2023-11-04 DIAGNOSIS — R5383 Other fatigue: Secondary | ICD-10-CM | POA: Diagnosis not present

## 2023-11-07 DIAGNOSIS — R350 Frequency of micturition: Secondary | ICD-10-CM | POA: Diagnosis not present

## 2023-11-07 DIAGNOSIS — J069 Acute upper respiratory infection, unspecified: Secondary | ICD-10-CM | POA: Diagnosis not present

## 2023-11-07 DIAGNOSIS — Z7901 Long term (current) use of anticoagulants: Secondary | ICD-10-CM | POA: Diagnosis not present

## 2023-11-07 DIAGNOSIS — E782 Mixed hyperlipidemia: Secondary | ICD-10-CM | POA: Diagnosis not present

## 2023-11-07 DIAGNOSIS — I480 Paroxysmal atrial fibrillation: Secondary | ICD-10-CM | POA: Diagnosis not present

## 2023-11-07 DIAGNOSIS — I10 Essential (primary) hypertension: Secondary | ICD-10-CM | POA: Diagnosis not present

## 2023-11-07 DIAGNOSIS — R944 Abnormal results of kidney function studies: Secondary | ICD-10-CM | POA: Diagnosis not present

## 2023-11-08 ENCOUNTER — Telehealth: Payer: Self-pay | Admitting: Cardiology

## 2023-11-08 NOTE — Telephone Encounter (Signed)
Spoke with patient regarding her symptoms she was having. Reported some SOB, No Chest Pain. She took her BP at 6:00 a.m this morning before her medications and was 143/128 HR 98 then again after her medications 114/85 HR 102. She stated that she was in the ED on 11/08 and they told her she needed to follow up before 12/20. I offered her an opening with Lanora Manis on 11/27 she declined she wanted to wait to see Dr. Diona Browner. She also stated that she was told that her Diltiazem needed to be changed currently on 30 mg twice daily, and is taking as prescribed. Advised her that will send over to provider.

## 2023-11-08 NOTE — Telephone Encounter (Signed)
Patient c/o Palpitations:  STAT if patient reporting lightheadedness, shortness of breath, or chest pain  How long have you had palpitations/irregular HR/ Afib? Are you having the symptoms now? Patient said she saw the doctor yesterday around 3:30 and she was in Afib  Are you currently experiencing lightheadedness, SOB or CP? Yes,Shortness of breath  Do you have a history of afib (atrial fibrillation) or irregular heart rhythm?   Have you checked your BP or HR? (document readings if available):   Are you experiencing any other symptoms? Dehydrated, patient said she was told to see Dr Diona Browner asap, before the appointment she has for December 20,2024

## 2023-11-09 NOTE — Telephone Encounter (Signed)
Per Dr. Diona Browner: I reviewed the chart.  It looks like she was seen in the [facility] ER recently with rapid atrial fibrillation.  They treated her with intravenous diltiazem but made no subsequent adjustments, so I suspect she is still symptomatic and does not have good heart rate control.  She cannot wait until December obviously.  You can schedule her during the afternoon one day this week when I am [facility].  I have no other options.   Patient informed and verbalized understanding of plan. Advised her that this appointment is in [place] as well.

## 2023-11-10 ENCOUNTER — Telehealth: Payer: Self-pay | Admitting: Cardiology

## 2023-11-10 ENCOUNTER — Encounter: Payer: Self-pay | Admitting: *Deleted

## 2023-11-10 NOTE — Telephone Encounter (Incomplete)
   Pre-operative Risk Assessment    Patient Name: Martha Torres  DOB: 08-16-1946 MRN: [id number]{      Request for Surgical Clearance{ 1. What type of surgery is being performed? Enter name of procedure below and number of teeth if dental extraction.  :1}    Procedure:   INJECTION IN LEFT KNEE AND BILATERAL SHOULDERS { 2. When is this surgery scheduled? Press F2 to enter date below and place date in Reason for Visit (see directions below). :1} Date of Surgery:  Clearance TBD                               { 3. What is the name of the Surgeon, the Surgeon's Group or Practice, phone and fax number?  Press F2 and list below :1}  Surgeon:  DOES NOT STATE Surgeon's Group or Practice Name:  [facility] Phone number:  [phone]  Fax number:  [phone] { 4. What type of clearance is requested?  Medical or Cardiac Clearance only?  Pharmacy Clearance Only (Request is to hold medication only)?  Or Both?  Press F2 and select the clearance requested.  If both are needed, select both from the drop down list.     :1}  Type of Clearance Requested:   - Medical  { 5. What type of anesthesia will be used?  Press F2 and select the anesthesia to be used for the procedure.  :1}  Type of Anesthesia:  None  { 6. Are there any other requests or questions from the surgeon?    :1}  Additional requests/questions:  ***  Signed, Vallarie Mare   11/10/2023, 2:20 PM

## 2023-11-11 ENCOUNTER — Other Ambulatory Visit: Payer: Self-pay | Admitting: Cardiology

## 2023-11-11 ENCOUNTER — Ambulatory Visit: Payer: Medicare Other | Attending: Cardiology | Admitting: Cardiology

## 2023-11-11 ENCOUNTER — Encounter: Payer: Self-pay | Admitting: Cardiology

## 2023-11-11 VITALS — BP 140/94 | HR 130 | Ht 62.5 in | Wt 199.0 lb

## 2023-11-11 DIAGNOSIS — I4891 Unspecified atrial fibrillation: Secondary | ICD-10-CM

## 2023-11-11 DIAGNOSIS — E782 Mixed hyperlipidemia: Secondary | ICD-10-CM | POA: Diagnosis not present

## 2023-11-11 DIAGNOSIS — I6523 Occlusion and stenosis of bilateral carotid arteries: Secondary | ICD-10-CM

## 2023-11-11 DIAGNOSIS — I480 Paroxysmal atrial fibrillation: Secondary | ICD-10-CM

## 2023-11-11 DIAGNOSIS — I10 Essential (primary) hypertension: Secondary | ICD-10-CM

## 2023-11-11 DIAGNOSIS — I4819 Other persistent atrial fibrillation: Secondary | ICD-10-CM

## 2023-11-11 MED ORDER — AMIODARONE HCL 200 MG PO TABS: 200.0000 mg | ORAL_TABLET | Freq: Two times a day (BID) | ORAL | 0 refills | Status: DC

## 2023-11-11 NOTE — H&P (View-Only) (Signed)
    Cardiology Office Note  Date: 11/11/2023   ID: [name], [name] [birth year], MRN [id number]  History of Present Illness: Martha Torres is a 77 y.o. female last seen in June.  She presents for a follow-up visit after recent ER visit at [facility] on November 8 with rapid atrial fibrillation.  I reviewed her records, she was treated with intravenous diltiazem achieving better heart rate control at the time, but no adjustments were made in her baseline therapy and she remains with uncontrolled heart rate today.  She states that she developed a sense of rapid heartbeat/palpitations 3 to 4 days prior to that encounter.  Feels short of breath with activity, no chest pain.  Current cardiac regimen includes aspirin, Xarelto, Cozaar, Crestor, chlorthalidone, potassium supplement, atenolol 50 mg twice daily, and Cardizem 30 mg twice daily.  ECG today shows atrial fibrillation with RVR.  Heart rate after being seated for several minutes was 105 bpm at rest.  Physical Exam: VS:  BP (!) 140/94   Pulse (!) 130   Ht 5' 2.5" (1.588 m)   Wt 199 lb (90.3 kg)   SpO2 98%   BMI 35.82 kg/m , BMI Body mass index is 35.82 kg/m.  Wt Readings from Last 3 Encounters:  11/11/23 199 lb (90.3 kg)  09/01/23 192 lb 12.8 oz (87.5 kg)  06/06/23 194 lb 9.6 oz (88.3 kg)    General: Patient appears comfortable at rest. HEENT: Conjunctiva and lids normal. Neck: Supple, no elevated JVP. Lungs: Clear to auscultation, nonlabored breathing at rest. Cardiac: Irregularly irregular without gallop. Abdomen: Bowel sounds present. Extremities: No pitting edema.  ECG:  An ECG dated 11/04/2023 was personally reviewed today and demonstrated:  Atrial fibrillation with RVR.  Labwork:  October 2024: Cholesterol 142, triglycerides 155, HDL 50, LDL 66 November 2024: Hemoglobin 13, platelets 310, BUN 26, creatinine 1.08, potassium 5, AST 15, ALT 16  Other Studies Reviewed Today:  No interval cardiac testing for  review today.  Assessment and Plan:  1.  Paroxysmal atrial fibrillation with CHA2DS2-VASc score of 6.  She has had a recent persistent episode with RVR as discussed above.  We discussed treatment options, plan is to have her discontinue diltiazem, start amiodarone 200 mg twice daily, and otherwise continue present dose of atenolol and Xarelto.  We will tentatively go ahead and schedule an outpatient cardioversion for next week presuming she does not convert in the interim.  Following cardioversion can then determine baseline AV nodal blocker regimen depending on resting heart rate in sinus rhythm.  Would anticipate cutting her amiodarone back to 200 mg once daily as well.   2.  Carotid artery disease status post right CEA in 2002 and subsequent right carotid artery stenting in 2021.  Carotid Dopplers in September of this year revealed patent RICA stent and 40 to 59% LICA stenosis..  She follows with Dr. Edilia Bo at [facility].   3.  Primary hypertension.   4.  Mixed hyperlipidemia, LDL 66 in October.  She continues on Crestor.  Disposition:  Follow up  within 1 month after cardioversion.  Signed, Jonelle Sidle, M.D., F.A.C.C. [facility] at [facility]

## 2023-11-11 NOTE — Progress Notes (Signed)
    Cardiology Office Note  Date: 11/11/2023   ID: Martha, Torres [birth year], MRN [id number]  History of Present Illness: Martha Torres is a 77 y.o. female last seen in June.  She presents for a follow-up visit after recent ER visit at [facility] on November 8 with rapid atrial fibrillation.  I reviewed her records, she was treated with intravenous diltiazem achieving better heart rate control at the time, but no adjustments were made in her baseline therapy and she remains with uncontrolled heart rate today.  She states that she developed a sense of rapid heartbeat/palpitations 3 to 4 days prior to that encounter.  Feels short of breath with activity, no chest pain.  Current cardiac regimen includes aspirin, Xarelto, Cozaar, Crestor, chlorthalidone, potassium supplement, atenolol 50 mg twice daily, and Cardizem 30 mg twice daily.  ECG today shows atrial fibrillation with RVR.  Heart rate after being seated for several minutes was 105 bpm at rest.  Physical Exam: VS:  BP (!) 140/94   Pulse (!) 130   Ht 5' 2.5" (1.588 m)   Wt 199 lb (90.3 kg)   SpO2 98%   BMI 35.82 kg/m , BMI Body mass index is 35.82 kg/m.  Wt Readings from Last 3 Encounters:  11/11/23 199 lb (90.3 kg)  09/01/23 192 lb 12.8 oz (87.5 kg)  06/06/23 194 lb 9.6 oz (88.3 kg)    General: Patient appears comfortable at rest. HEENT: Conjunctiva and lids normal. Neck: Supple, no elevated JVP. Lungs: Clear to auscultation, nonlabored breathing at rest. Cardiac: Irregularly irregular without gallop. Abdomen: Bowel sounds present. Extremities: No pitting edema.  ECG:  An ECG dated 11/04/2023 was personally reviewed today and demonstrated:  Atrial fibrillation with RVR.  Labwork:  October 2024: Cholesterol 142, triglycerides 155, HDL 50, LDL 66 November 2024: Hemoglobin 13, platelets 310, BUN 26, creatinine 1.08, potassium 5, AST 15, ALT 16  Other Studies Reviewed Today:  No interval cardiac testing for  review today.  Assessment and Plan:  1.  Paroxysmal atrial fibrillation with CHA2DS2-VASc score of 6.  She has had a recent persistent episode with RVR as discussed above.  We discussed treatment options, plan is to have her discontinue diltiazem, start amiodarone 200 mg twice daily, and otherwise continue present dose of atenolol and Xarelto.  We will tentatively go ahead and schedule an outpatient cardioversion for next week presuming she does not convert in the interim.  Following cardioversion can then determine baseline AV nodal blocker regimen depending on resting heart rate in sinus rhythm.  Would anticipate cutting her amiodarone back to 200 mg once daily as well.   2.  Carotid artery disease status post right CEA in 2002 and subsequent right carotid artery stenting in 2021.  Carotid Dopplers in September of this year revealed patent RICA stent and 40 to 59% LICA stenosis..  She follows with Dr. Edilia Bo at [facility].   3.  Primary hypertension.   4.  Mixed hyperlipidemia, LDL 66 in October.  She continues on Crestor.  Disposition:  Follow up  within 1 month after cardioversion.  Signed, Jonelle Sidle, M.D., F.A.C.C. [facility]

## 2023-11-11 NOTE — Patient Instructions (Signed)
Medication Instructions:   STOP Diltiazem   START Amiodarone 200 mg TWICE a day  Labwork: None today  Testing/Procedures: Your physician has recommended that you have a Cardioversion (DCCV) next week-preferably Tuesday or Wednesday. Electrical Cardioversion uses a jolt of electricity to your heart either through paddles or wired patches attached to your chest. This is a controlled, usually prescheduled, procedure. Defibrillation is done under light anesthesia in the hospital, and you usually go home the day of the procedure. This is done to get your heart back into a normal rhythm. You are not awake for the procedure. Please see the instruction sheet given to you today.   Follow-Up: After cardioversion  Any Other Special Instructions Will Be Listed Below (If Applicable).  If you need a refill on your cardiac medications before your next appointment, please call your pharmacy.

## 2023-11-14 NOTE — Patient Instructions (Signed)
Martha Torres  11/14/2023     @PREFPERIOPPHARMACY @   Your procedure is scheduled on 11/16/2023.  Report to [facility] at 11:00 A.M.  Call this number if you have problems the morning of surgery:  [phone]  If you experience any cold or flu symptoms such as cough, fever, chills, shortness of breath, etc. between now and your scheduled surgery, please notify us at the above number.   Remember:   Do not eat anything after midnight.   You may drink clear liquids until 9:00am .  Clear liquids allowed are:                    Water, Carbonated beverages (diabetics please choose diet or no sugar options), Clear Tea (No creamer, milk, or cream, including half & half and powdered creamer), and Clear Sports drink (No red color; diabetics please choose diet or no sugar options)   Take these medicines the morning of surgery with A SIP OF WATER : Omeprazole   Synthroid Neurontin Atenolol Amiodarone and Xanax (If needed)  Do Not Miss Any Xarelto Doses    Do not wear jewelry, make-up or nail polish, including gel polish,  artificial nails, or any other type of covering on natural nails (fingers and  toes).  Do not wear lotions, powders, or perfumes, or deodorant.  Do not shave 48 hours prior to surgery.  Men may shave face and neck.  Do not bring valuables to the hospital.  [facility] is not responsible for any belongings or valuables.  Contacts, dentures or bridgework may not be worn into surgery.  Leave your suitcase in the car.  After surgery it may be brought to your room.  For patients admitted to the hospital, discharge time will be determined by your treatment team.  Patients discharged the day of surgery will not be allowed to drive home.   Name and phone number of your driver:   Family Special instructions:  N/A  Please read over the following fact sheets that you were given. Care and Recovery After Surgery  Electrical Cardioversion Electrical cardioversion is the  delivery of a jolt of electricity to restore a normal rhythm to the heart. A rhythm that is too fast or is not regular (arrhythmia) keeps the heart from pumping blood well. There is also another type of cardioversion called a chemical (pharmacologic) cardioversion. This is when your health care provider gives you one or more medicines to bring back your regular heart rhythm. Electrical cardioversion is done as a scheduled procedure for arrhythmiasthat are not life-threatening. Electrical cardioversion may also be done in an emergency for sudden life-threatening arrhythmias. Tell a health care provider about: Any allergies you have. All medicines you are taking, including vitamins, herbs, eye drops, creams, and over-the-counter medicines. Any problems you or family members have had with sedatives or anesthesia. Any bleeding problems you have. Any surgeries you have had, including a pacemaker, defibrillator, or other implanted device. Any medical conditions you have. Whether you are pregnant or may be pregnant. What are the risks? Your provider will talk with you about risks. These include: Allergic reactions to medicines. Irritation to the skin on your chest or back where the sticky pads (electrodes) or paddles were put during electrical cardioversion. A blood clot that breaks free and travels to other parts of your body, such as your brain. Return of a worse abnormal heart rhythm that will need to be treated with medicines, a pacemaker, or an implantable cardioverter  defibrillator (ICD). What happens before the procedure? Medicines Your provider may give you: Blood-thinning medicines (anticoagulants) so your blood does not clot as easily. If your provider gives you this medicine, you may need to take it for 4 weeks before the procedure. Medicines to help stabilize your heart rate and rhythm. Ask your provider about: Changing or stopping your regular medicines. These include any diabetes  medicines or blood thinners you take. Taking medicines such as aspirin and ibuprofen. These medicines can thin your blood. Do not take them unless your provider tells you to. Taking over-the-counter medicines, vitamins, herbs, and supplements. General instructions Follow instructions from your provider about what you may eat and drink. Do not put any lotions, powders, or ointments on your chest and back for 24 hours before the procedure. They can cause problems with the electrodes or paddles used to deliver electricity to your heart. Do not wear jewelry as this can interfere with delivering electricity to your heart. If you will be going home right after the procedure, plan to have a responsible adult: Take you home from the hospital or clinic. You will not be allowed to drive. Care for you for the time you are told. Tests You may have an exam or testing. This may include: Blood labs. A transesophageal echocardiogram (TEE). What happens during the procedure?     An IV will be inserted into one of your veins. You will be given a sedative. This helps you relax. Electrodes or metal paddles will be placed on your chest. They may be placed in one of these ways: One placed on your right chest, the other on the left ribs. One placed on your chest and the other on your back. An electrical shock will be delivered. The shock briefly stops (resets) your heart rhythm. Your provider will check to see if your heart rhythm is now normal. Some people need only one shock. Some need more to restore a normal heart rhythm. The procedure may vary among providers and hospitals. What happens after the procedure? Your blood pressure, heart rate, breathing rate, and blood oxygen level will be monitored until you leave the hospital or clinic. Your heart rhythm will be watched to make sure it does not change. This information is not intended to replace advice given to you by your health care provider. Make sure  you discuss any questions you have with your health care provider. Document Revised: 08/05/2022 Document Reviewed: 08/05/2022 Elsevier Patient Education  2024 [employer]  General Anesthesia, Adult General anesthesia is the use of medicine to make you fall asleep (unconscious) for a medical procedure. General anesthesia must be used for certain procedures. It is often recommended for surgery or procedures that: Last a long time. Require you to be still or in an unusual position. Are major and can cause blood loss. Affect your breathing. The medicines used for general anesthesia are called general anesthetics. During general anesthesia, these medicines are given along with medicines that: Prevent pain. Control your blood pressure. Relax your muscles. Prevent nausea and vomiting after the procedure. Tell a health care provider about: Any allergies you have. All medicines you are taking, including vitamins, herbs, eye drops, creams, and over-the-counter medicines. Your history of any: Medical conditions you have, including: High blood pressure. Bleeding problems. Diabetes. Heart or lung conditions, such as: Heart failure. Sleep apnea. Asthma. Chronic obstructive pulmonary disease (COPD). Current or recent illnesses, such as: Upper respiratory, chest, or ear infections. Cough or fever. Tobacco or drug use, including  marijuana or alcohol use. Depression or anxiety. Surgeries and types of anesthetics you have had. Problems you or family members have had with anesthetic medicines. Whether you are pregnant or may be pregnant. Whether you have any chipped or loose teeth, dentures, caps, bridgework, or issues with your mouth, swallowing, or choking. What are the risks? Your health care provider will talk with you about risks. These may include: Allergic reaction to the medicines. Lung and heart problems. Inhaling food or liquid from the stomach into the lungs (aspiration). Nerve  injury. Injury to the lips, mouth, teeth, or gums. Stroke. Waking up during your procedure and being unable to move. This is rare. These problems are more likely to develop if you are having a major surgery or if you have an advanced or serious medical condition. You can prevent some of these complications by answering all of your health care provider's questions thoroughly and by following all instructions before your procedure. General anesthesia can cause side effects, including: Nausea or vomiting. A sore throat or hoarseness from the breathing tube. Wheezing or coughing. Shaking chills or feeling cold. Body aches. Sleepiness. Confusion, agitation (delirium), or anxiety. What happens before the procedure? When to stop eating and drinking Follow instructions from your health care provider about what you may eat and drink before your procedure. If you do not follow your health care provider's instructions, your procedure may be delayed or canceled. Medicines Ask your health care provider about: Changing or stopping your regular medicines. These include any diabetes medicines or blood thinners you take. Taking medicines such as aspirin and ibuprofen. These medicines can thin your blood. Do not take them unless your health care provider tells you to. Taking over-the-counter medicines, vitamins, herbs, and supplements. General instructions Do not use any products that contain nicotine or tobacco for at least 4 weeks before the procedure. These products include cigarettes, chewing tobacco, and vaping devices, such as e-cigarettes. If you need help quitting, ask your health care provider. If you brush your teeth on the morning of the procedure, make sure to spit out all of the water and toothpaste. If told by your health care provider, bring your sleep apnea device with you to surgery (if applicable). If you will be going home right after the procedure, plan to have a responsible adult: Take  you home from the hospital or clinic. You will not be allowed to drive. Care for you for the time you are told. What happens during the procedure?  An IV will be inserted into one of your veins. You will be given one or more of the following through a face mask or IV: A sedative. This helps you relax. Anesthesia. This will: Numb certain areas of your body. Make you fall asleep for surgery. After you are unconscious, a breathing tube may be inserted down your throat to help you breathe. This will be removed before you wake up. An anesthesia provider, such as an anesthesiologist, will stay with you throughout your procedure. The anesthesia provider will: Keep you comfortable and safe by continuing to give you medicines and adjusting the amount of medicine that you get. Monitor your blood pressure, heart rate, and oxygen levels to make sure that the anesthetics do not cause any problems. The procedure may vary among health care providers and hospitals. What happens after the procedure? Your blood pressure, temperature, heart rate, breathing rate, and blood oxygen level will be monitored until you leave the hospital or clinic. You will wake up in a  recovery area. You may wake up slowly. You may be given medicine to help you with pain, nausea, or any other side effects from the anesthesia. Summary General anesthesia is the use of medicine to make you fall asleep (unconscious) for a medical procedure. Follow your health care provider's instructions about when to stop eating, drinking, or taking certain medicines before your procedure. Plan to have a responsible adult take you home from the hospital or clinic. This information is not intended to replace advice given to you by your health care provider. Make sure you discuss any questions you have with your health care provider. Document Revised: 03/11/2022 Document Reviewed: 03/11/2022 Elsevier Patient Education  2024 [employer].

## 2023-11-15 ENCOUNTER — Encounter (HOSPITAL_COMMUNITY): Payer: Self-pay

## 2023-11-15 ENCOUNTER — Encounter (HOSPITAL_COMMUNITY)
Admission: RE | Admit: 2023-11-15 | Discharge: 2023-11-15 | Disposition: A | Discharge status: Home / Self Care | Payer: Medicare Other | Source: Ambulatory Visit | Attending: Internal Medicine | Admitting: Internal Medicine

## 2023-11-15 DIAGNOSIS — Z01812 Encounter for preprocedural laboratory examination: Secondary | ICD-10-CM | POA: Diagnosis not present

## 2023-11-15 DIAGNOSIS — I4819 Other persistent atrial fibrillation: Secondary | ICD-10-CM | POA: Diagnosis not present

## 2023-11-15 LAB — BASIC METABOLIC PANEL
Anion gap: 12 (ref 5–15)
BUN: 30 mg/dL — ABNORMAL HIGH (ref 8–23)
CO2: 20 mmol/L — ABNORMAL LOW (ref 22–32)
Calcium: 8.8 mg/dL — ABNORMAL LOW (ref 8.9–10.3)
Chloride: 106 mmol/L (ref 98–111)
Creatinine, Ser: 1.18 mg/dL — ABNORMAL HIGH (ref 0.44–1.00)
GFR, Estimated: 48 mL/min — ABNORMAL LOW (ref >60)
Glucose, Bld: 135 mg/dL — ABNORMAL HIGH (ref 70–99)
Potassium: 4.1 mmol/L (ref 3.5–5.1)
Sodium: 138 mmol/L (ref 135–145)

## 2023-11-16 ENCOUNTER — Ambulatory Visit (HOSPITAL_COMMUNITY): Payer: Medicare Other | Admitting: Anesthesiology

## 2023-11-16 ENCOUNTER — Encounter (HOSPITAL_COMMUNITY): Payer: Self-pay | Admitting: Internal Medicine

## 2023-11-16 ENCOUNTER — Encounter (HOSPITAL_COMMUNITY)
Admission: RE | Disposition: A | Discharge status: Home / Self Care | Payer: Self-pay | Source: Ambulatory Visit | Attending: Internal Medicine

## 2023-11-16 ENCOUNTER — Ambulatory Visit (HOSPITAL_COMMUNITY)
Admission: RE | Admit: 2023-11-16 | Discharge: 2023-11-16 | Disposition: A | Discharge status: Home / Self Care | Payer: Medicare Other | Source: Ambulatory Visit | Attending: Internal Medicine | Admitting: Internal Medicine

## 2023-11-16 DIAGNOSIS — Z538 Procedure and treatment not carried out for other reasons: Secondary | ICD-10-CM | POA: Insufficient documentation

## 2023-11-16 DIAGNOSIS — I4819 Other persistent atrial fibrillation: Secondary | ICD-10-CM

## 2023-11-16 DIAGNOSIS — I2510 Atherosclerotic heart disease of native coronary artery without angina pectoris: Secondary | ICD-10-CM | POA: Insufficient documentation

## 2023-11-16 DIAGNOSIS — I480 Paroxysmal atrial fibrillation: Secondary | ICD-10-CM | POA: Diagnosis not present

## 2023-11-16 DIAGNOSIS — I10 Essential (primary) hypertension: Secondary | ICD-10-CM | POA: Diagnosis not present

## 2023-11-16 DIAGNOSIS — E782 Mixed hyperlipidemia: Secondary | ICD-10-CM | POA: Diagnosis not present

## 2023-11-16 SURGERY — CARDIOVERSION
Anesthesia: Monitor Anesthesia Care

## 2023-11-16 MED ORDER — SODIUM CHLORIDE 0.9% FLUSH
10.0000 mL | Freq: Two times a day (BID) | INTRAVENOUS | Status: DC
Start: 2023-11-16 — End: 2023-11-17

## 2023-11-16 MED ORDER — AMIODARONE HCL 200 MG PO TABS: 200.0000 mg | ORAL_TABLET | Freq: Every day | ORAL | 0 refills | Status: DC

## 2023-11-16 MED ORDER — ORAL CARE MOUTH RINSE: 15.0000 mL | Freq: Once | OROMUCOSAL | Status: DC

## 2023-11-16 MED ORDER — CHLORHEXIDINE GLUCONATE 0.12 % MT SOLN: 15.0000 mL | Freq: Once | OROMUCOSAL | Status: DC

## 2023-11-16 MED ORDER — AMIODARONE HCL 200 MG PO TABS: 200.0000 mg | ORAL_TABLET | Freq: Two times a day (BID) | ORAL | 0 refills | Status: DC

## 2023-11-16 MED ORDER — LACTATED RINGERS IV SOLN: INTRAVENOUS | Status: DC

## 2023-11-16 NOTE — Progress Notes (Signed)
D/C instructions reviewed with pt and son. Questions answered. D/C to home in good condition.

## 2023-11-16 NOTE — Interval H&P Note (Signed)
History and Physical Interval Note:  11/16/2023 11:56 AM  EKG showed NSR. DCCV canceled.    Edgerrin Correia P Yona Kosek, MD.

## 2023-11-16 NOTE — Progress Notes (Signed)
Dr Johnnette Litter notified NSR EKG on arrival. No new orders given. Message left on cell phone for Dr Jenene Slicker of NSR EKG on arrival. Waiting on further instructions from her.

## 2023-11-16 NOTE — Plan of Care (Signed)
EKG showed NSR. DCCV canceled. Will place discharge orders.

## 2023-11-16 NOTE — Progress Notes (Signed)
Dr Jenene Slicker in to talk with pt. Questions answered. Cardioversion canceled. Cleared for d/c home.

## 2023-11-22 ENCOUNTER — Telehealth: Payer: Self-pay | Admitting: Cardiology

## 2023-11-22 NOTE — Telephone Encounter (Signed)
There are two different amiodarone prescriptions because one is a bid dosing,then, starting 11/24/23,pt goes to one a day     I spoke with Mellody Dance at [facility] and he noted starting 11/24/23, pt will take amiodarone 200 mg daily. Patient has enough medication to last until mid December.

## 2023-11-22 NOTE — Telephone Encounter (Signed)
Pt c/o medication issue:  1. Name of Medication: amiodarone (PACERONE) 200 MG tablet   2. How are you currently taking this medication (dosage and times per day)? Not sure  3. Are you having a reaction (difficulty breathing--STAT)? No   4. What is your medication issue? 2 different scripts sent in, Pt and pharmacy are confused. Please advise

## 2023-11-30 NOTE — Telephone Encounter (Signed)
Office is calling for update. Please advise 

## 2023-11-30 NOTE — Telephone Encounter (Signed)
Preoperative team, patient procedure is scheduled TBD.  She has upcoming appointment scheduled with Dr. Diona Browner.  Please add preoperative cardiac evaluation to appointment note.  I will defer cardiac recommendations and preoperative cardiac evaluation to appointment.  Thank you for your help.  Thomasene Ripple. Eulla Kochanowski NP-C     11/30/2023, 12:01 PM [facility] [address] Office [phone] Fax [phone]

## 2023-12-02 NOTE — Telephone Encounter (Signed)
I have updated appointment notes to reflect pre-op clearance.

## 2023-12-08 DIAGNOSIS — Z780 Asymptomatic menopausal state: Secondary | ICD-10-CM | POA: Diagnosis not present

## 2023-12-08 DIAGNOSIS — M85852 Other specified disorders of bone density and structure, left thigh: Secondary | ICD-10-CM | POA: Diagnosis not present

## 2023-12-08 DIAGNOSIS — Z13820 Encounter for screening for osteoporosis: Secondary | ICD-10-CM | POA: Diagnosis not present

## 2023-12-08 DIAGNOSIS — M8589 Other specified disorders of bone density and structure, multiple sites: Secondary | ICD-10-CM | POA: Diagnosis not present

## 2023-12-16 ENCOUNTER — Encounter: Payer: Self-pay | Admitting: Cardiology

## 2023-12-16 ENCOUNTER — Ambulatory Visit: Payer: Medicare Other | Attending: Cardiology | Admitting: Cardiology

## 2023-12-16 VITALS — BP 118/74 | HR 60 | Ht 65.0 in | Wt 204.4 lb

## 2023-12-16 DIAGNOSIS — I6523 Occlusion and stenosis of bilateral carotid arteries: Secondary | ICD-10-CM

## 2023-12-16 DIAGNOSIS — I4819 Other persistent atrial fibrillation: Secondary | ICD-10-CM | POA: Diagnosis not present

## 2023-12-16 DIAGNOSIS — E782 Mixed hyperlipidemia: Secondary | ICD-10-CM

## 2023-12-16 NOTE — Patient Instructions (Addendum)
Medication Instructions:  Your physician recommends that you continue on your current medications as directed. Please refer to the Current Medication list given to you today.   Labwork: none  Testing/Procedures: none  Follow-Up:  Your physician recommends that you schedule a follow-up appointment in: 3-4 months  Any Other Special Instructions Will Be Listed Below (If Applicable).  If you need a refill on your cardiac medications before your next appointment, please call your pharmacy.  

## 2023-12-16 NOTE — Progress Notes (Signed)
    Cardiology Office Note  Date: 12/16/2023   ID: Kenzlee, [place]oure[place] February 24, 1946, MRN [id number]  History of Present Illness: Martha Torres is a 77 y.o. female last seen in November.  She was in persistent atrial fibrillation at that time resulting in medication adjustments and plan for outpatient cardioversion.  Fortunately, when she presented for the procedure on November 20 she had already converted to sinus rhythm.  She is here for a follow-up, states that she feels better, no interval palpitations.  I reviewed her ECG today which shows normal sinus rhythm.  I reviewed her medications.  Current cardiac regimen includes aspirin, atenolol, chlorthalidone, Cozaar, potassium supplement, Xarelto, amiodarone, and Crestor.  We discussed continuing amiodarone 200 mg daily for suppression of atrial fibrillation.  Chart review finds question regarding clearance for left knee steroid injections at [facility] here in [place].  She has had injections before with symptomatic relief, otherwise considering left TKR with Dr. Lequita Halt in [place].  She does not have a contraindication to proceed with joint steroid injections based on history of atrial fibrillation, my understanding is that anticoagulation is not held for this either.  Physical Exam: VS:  BP 118/74 (BP Location: Right Arm, Cuff Size: Normal)   Pulse 60   Ht 5\' 5"  (1.651 m)   Wt 204 lb 6.4 oz (92.7 kg)   SpO2 98%   BMI 34.01 kg/m , BMI Body mass index is 34.01 kg/m.  Wt Readings from Last 3 Encounters:  12/16/23 204 lb 6.4 oz (92.7 kg)  11/15/23 199 lb (90.3 kg)  11/11/23 199 lb (90.3 kg)    General: Patient appears comfortable at rest. HEENT: Conjunctiva and lids normal. Lungs: Clear to auscultation, nonlabored breathing at rest. Cardiac: Regular rate and rhythm, no S3 or significant systolic murmur, no pericardial rub.  ECG:  An ECG dated 11/16/2023 was personally reviewed today and demonstrated:  Sinus  rhythm.  Labwork: 11/15/2023: BUN 30; Creatinine, Ser 1.18; Potassium 4.1; Sodium 138   Other Studies Reviewed Today:  No interval cardiac testing for review today.  Assessment and Plan:  1.  Paroxysmal to persistent atrial fibrillation with CHA2DS2-VASc score of 6.  She ultimately did not require electrical cardioversion having converted to sinus rhythm after initiation of oral amiodarone.  She remains in sinus rhythm today and is symptomatically improved.  Plan to continue Xarelto for stroke prophylaxis, also atenolol and amiodarone.  2.  No cardiac contraindication for her to proceed with left knee steroid injections.  She has known paroxysmal to persistent atrial fibrillation, currently with better rhythm control after initiation of amiodarone.   3.  Carotid artery disease status post right CEA in 2002 and subsequent right carotid artery stenting in 2021.  Carotid Dopplers in September revealed patent RICA stent and 40 to 59% LICA stenosis..  She follows with Dr. Edilia Bo at [facility].   4.  Primary hypertension.   5.  Mixed hyperlipidemia, LDL 66 in October.  She continues on Crestor.  Disposition:  Follow up  3 to 4 months.  Signed, Jonelle Sidle, M.D., F.A.C.C. [facility]

## 2023-12-19 DIAGNOSIS — M1712 Unilateral primary osteoarthritis, left knee: Secondary | ICD-10-CM | POA: Diagnosis not present

## 2024-01-19 DIAGNOSIS — M545 Low back pain, unspecified: Secondary | ICD-10-CM | POA: Diagnosis not present

## 2024-01-19 DIAGNOSIS — M533 Sacrococcygeal disorders, not elsewhere classified: Secondary | ICD-10-CM | POA: Diagnosis not present

## 2024-01-19 DIAGNOSIS — G8929 Other chronic pain: Secondary | ICD-10-CM | POA: Diagnosis not present

## 2024-01-26 DIAGNOSIS — Z9889 Other specified postprocedural states: Secondary | ICD-10-CM | POA: Diagnosis not present

## 2024-01-26 DIAGNOSIS — M545 Low back pain, unspecified: Secondary | ICD-10-CM | POA: Diagnosis not present

## 2024-01-26 DIAGNOSIS — M533 Sacrococcygeal disorders, not elsewhere classified: Secondary | ICD-10-CM | POA: Diagnosis not present

## 2024-01-26 DIAGNOSIS — G8929 Other chronic pain: Secondary | ICD-10-CM | POA: Diagnosis not present

## 2024-01-31 DIAGNOSIS — E782 Mixed hyperlipidemia: Secondary | ICD-10-CM | POA: Diagnosis not present

## 2024-01-31 DIAGNOSIS — E559 Vitamin D deficiency, unspecified: Secondary | ICD-10-CM | POA: Diagnosis not present

## 2024-01-31 DIAGNOSIS — E1169 Type 2 diabetes mellitus with other specified complication: Secondary | ICD-10-CM | POA: Diagnosis not present

## 2024-01-31 DIAGNOSIS — E039 Hypothyroidism, unspecified: Secondary | ICD-10-CM | POA: Diagnosis not present

## 2024-02-08 DIAGNOSIS — J209 Acute bronchitis, unspecified: Secondary | ICD-10-CM | POA: Diagnosis not present

## 2024-02-08 DIAGNOSIS — J4520 Mild intermittent asthma, uncomplicated: Secondary | ICD-10-CM | POA: Diagnosis not present

## 2024-02-08 DIAGNOSIS — R197 Diarrhea, unspecified: Secondary | ICD-10-CM | POA: Diagnosis not present

## 2024-02-08 DIAGNOSIS — E039 Hypothyroidism, unspecified: Secondary | ICD-10-CM | POA: Diagnosis not present

## 2024-02-08 DIAGNOSIS — Z7984 Long term (current) use of oral hypoglycemic drugs: Secondary | ICD-10-CM | POA: Diagnosis not present

## 2024-02-08 DIAGNOSIS — E1122 Type 2 diabetes mellitus with diabetic chronic kidney disease: Secondary | ICD-10-CM | POA: Diagnosis not present

## 2024-02-08 DIAGNOSIS — E860 Dehydration: Secondary | ICD-10-CM | POA: Diagnosis not present

## 2024-02-08 DIAGNOSIS — R0902 Hypoxemia: Secondary | ICD-10-CM | POA: Diagnosis not present

## 2024-02-08 DIAGNOSIS — A084 Viral intestinal infection, unspecified: Secondary | ICD-10-CM | POA: Diagnosis not present

## 2024-02-08 DIAGNOSIS — R059 Cough, unspecified: Secondary | ICD-10-CM | POA: Diagnosis not present

## 2024-02-08 DIAGNOSIS — Z1152 Encounter for screening for COVID-19: Secondary | ICD-10-CM | POA: Diagnosis not present

## 2024-02-08 DIAGNOSIS — I4891 Unspecified atrial fibrillation: Secondary | ICD-10-CM | POA: Diagnosis not present

## 2024-02-08 DIAGNOSIS — Z7982 Long term (current) use of aspirin: Secondary | ICD-10-CM | POA: Diagnosis not present

## 2024-02-08 DIAGNOSIS — I700 Atherosclerosis of aorta: Secondary | ICD-10-CM | POA: Diagnosis not present

## 2024-02-08 DIAGNOSIS — Z7901 Long term (current) use of anticoagulants: Secondary | ICD-10-CM | POA: Diagnosis not present

## 2024-02-08 DIAGNOSIS — I10 Essential (primary) hypertension: Secondary | ICD-10-CM | POA: Diagnosis not present

## 2024-02-08 DIAGNOSIS — E119 Type 2 diabetes mellitus without complications: Secondary | ICD-10-CM | POA: Diagnosis not present

## 2024-02-08 DIAGNOSIS — R1110 Vomiting, unspecified: Secondary | ICD-10-CM | POA: Diagnosis not present

## 2024-02-08 DIAGNOSIS — J45909 Unspecified asthma, uncomplicated: Secondary | ICD-10-CM | POA: Diagnosis not present

## 2024-02-08 DIAGNOSIS — K449 Diaphragmatic hernia without obstruction or gangrene: Secondary | ICD-10-CM | POA: Diagnosis not present

## 2024-02-08 DIAGNOSIS — R0989 Other specified symptoms and signs involving the circulatory and respiratory systems: Secondary | ICD-10-CM | POA: Diagnosis not present

## 2024-02-08 DIAGNOSIS — R112 Nausea with vomiting, unspecified: Secondary | ICD-10-CM | POA: Diagnosis not present

## 2024-02-09 DIAGNOSIS — I4891 Unspecified atrial fibrillation: Secondary | ICD-10-CM | POA: Diagnosis not present

## 2024-02-09 DIAGNOSIS — Z7901 Long term (current) use of anticoagulants: Secondary | ICD-10-CM | POA: Diagnosis not present

## 2024-02-09 DIAGNOSIS — Z79899 Other long term (current) drug therapy: Secondary | ICD-10-CM | POA: Diagnosis not present

## 2024-02-09 DIAGNOSIS — R062 Wheezing: Secondary | ICD-10-CM | POA: Diagnosis not present

## 2024-02-09 DIAGNOSIS — E119 Type 2 diabetes mellitus without complications: Secondary | ICD-10-CM | POA: Diagnosis not present

## 2024-02-09 DIAGNOSIS — R1110 Vomiting, unspecified: Secondary | ICD-10-CM | POA: Diagnosis not present

## 2024-02-09 DIAGNOSIS — J069 Acute upper respiratory infection, unspecified: Secondary | ICD-10-CM | POA: Diagnosis not present

## 2024-02-09 DIAGNOSIS — R197 Diarrhea, unspecified: Secondary | ICD-10-CM | POA: Diagnosis not present

## 2024-02-14 DIAGNOSIS — N1831 Chronic kidney disease, stage 3a: Secondary | ICD-10-CM | POA: Diagnosis not present

## 2024-02-14 DIAGNOSIS — R062 Wheezing: Secondary | ICD-10-CM | POA: Diagnosis not present

## 2024-02-14 DIAGNOSIS — R197 Diarrhea, unspecified: Secondary | ICD-10-CM | POA: Diagnosis not present

## 2024-02-14 DIAGNOSIS — E1122 Type 2 diabetes mellitus with diabetic chronic kidney disease: Secondary | ICD-10-CM | POA: Diagnosis not present

## 2024-02-14 DIAGNOSIS — E1165 Type 2 diabetes mellitus with hyperglycemia: Secondary | ICD-10-CM | POA: Diagnosis not present

## 2024-03-23 ENCOUNTER — Encounter: Payer: Self-pay | Admitting: *Deleted

## 2024-03-26 ENCOUNTER — Encounter: Payer: Self-pay | Admitting: Cardiology

## 2024-03-26 ENCOUNTER — Other Ambulatory Visit: Payer: Self-pay | Admitting: Internal Medicine

## 2024-03-26 ENCOUNTER — Ambulatory Visit: Payer: Medicare Other | Attending: Cardiology | Admitting: Cardiology

## 2024-03-26 VITALS — BP 132/72 | HR 60 | Ht 63.0 in | Wt 207.8 lb

## 2024-03-26 DIAGNOSIS — I6523 Occlusion and stenosis of bilateral carotid arteries: Secondary | ICD-10-CM

## 2024-03-26 DIAGNOSIS — I10 Essential (primary) hypertension: Secondary | ICD-10-CM

## 2024-03-26 DIAGNOSIS — I4819 Other persistent atrial fibrillation: Secondary | ICD-10-CM

## 2024-03-26 DIAGNOSIS — E782 Mixed hyperlipidemia: Secondary | ICD-10-CM

## 2024-03-26 NOTE — Patient Instructions (Signed)

## 2024-03-26 NOTE — Progress Notes (Signed)
    Cardiology Office Note  Date: 03/26/2024   ID: Martha Torres, Martha Torres 11-Sep-1946, MRN [id number]  History of Present Illness: Martha Torres is a 78 y.o. female seen in December 2024.  She is here for a routine visit.  Reports no interval palpitations or chest discomfort.  Stable NYHA class II dyspnea.  We went over her medications.  She reports no spontaneous bleeding problems on Xarelto.  Interval lab work from February is reviewed below.  I reviewed her ECG today which shows sinus rhythm with poor R wave progression.  Physical Exam: VS:  BP 132/72 (BP Location: Left Arm)   Pulse 60   Ht 5\' 3"  (1.6 m)   Wt 207 lb 12.8 oz (94.3 kg)   SpO2 96%   BMI 36.81 kg/m , BMI Body mass index is 36.81 kg/m.  Wt Readings from Last 3 Encounters:  03/26/24 207 lb 12.8 oz (94.3 kg)  12/16/23 204 lb 6.4 oz (92.7 kg)  11/15/23 199 lb (90.3 kg)    General: Patient appears comfortable at rest. HEENT: Conjunctiva and lids normal. Neck: Supple, no elevated JVP or carotid bruits. Lungs: Clear to auscultation, nonlabored breathing at rest. Cardiac: Regular rate and rhythm, no S3 or significant systolic murmur.  ECG:  An ECG dated 02/08/2024 was personally reviewed today and demonstrated:  Sinus rhythm with PACs.  Labwork: 11/15/2023: BUN 30; Creatinine, Ser 1.18; Potassium 4.1; Sodium 138  February 2025: BUN 22, creatinine 1.18, potassium 4.4, AST 15, ALT 17, magnesium 1.9, TSH 1.47, hemoglobin 11.2, platelets 223, cholesterol 110, triglycerides 122, HDL 44, LDL 44  Other Studies Reviewed Today:  No interval cardiac testing for review today.  Assessment and Plan:  1.  Paroxysmal to persistent atrial fibrillation with CHA2DS2-VASc score of 6.  Maintaining sinus rhythm on amiodarone 200 mg daily at this point with symptomatic improvement.  Continue Xarelto 20 mg daily for stroke prophylaxis.  Interval lab work reviewed.  2.  Carotid artery disease status post right CEA in 2002 and subsequent  right carotid artery stenting in 2021.  Carotid Dopplers in September 2024 revealed patent RICA stent and 40 to 59% LICA stenosis..  She follows with [facility].   3.  Primary hypertension.  Blood pressure rechecked by me today at 132/72.  Continue current regimen including Cozaar 100 mg daily, chlorthalidone 25 mg daily, and atenolol 50 mg twice daily.  She is also on a potassium supplement.   4.  Mixed hyperlipidemia, LDL 44 in February.  Continue Crestor 10 mg daily.  Disposition:  Follow up  6 months.  Signed, Jonelle Sidle, M.D., F.A.C.C. [facility]

## 2024-03-27 DIAGNOSIS — E1165 Type 2 diabetes mellitus with hyperglycemia: Secondary | ICD-10-CM | POA: Diagnosis not present

## 2024-03-27 DIAGNOSIS — E782 Mixed hyperlipidemia: Secondary | ICD-10-CM | POA: Diagnosis not present

## 2024-03-27 DIAGNOSIS — E039 Hypothyroidism, unspecified: Secondary | ICD-10-CM | POA: Diagnosis not present

## 2024-03-27 DIAGNOSIS — I480 Paroxysmal atrial fibrillation: Secondary | ICD-10-CM | POA: Diagnosis not present

## 2024-03-27 DIAGNOSIS — I129 Hypertensive chronic kidney disease with stage 1 through stage 4 chronic kidney disease, or unspecified chronic kidney disease: Secondary | ICD-10-CM | POA: Diagnosis not present

## 2024-03-27 DIAGNOSIS — K219 Gastro-esophageal reflux disease without esophagitis: Secondary | ICD-10-CM | POA: Diagnosis not present

## 2024-03-27 DIAGNOSIS — K625 Hemorrhage of anus and rectum: Secondary | ICD-10-CM | POA: Diagnosis not present

## 2024-03-27 DIAGNOSIS — R197 Diarrhea, unspecified: Secondary | ICD-10-CM | POA: Diagnosis not present

## 2024-03-27 DIAGNOSIS — R062 Wheezing: Secondary | ICD-10-CM | POA: Diagnosis not present

## 2024-03-27 DIAGNOSIS — I6521 Occlusion and stenosis of right carotid artery: Secondary | ICD-10-CM | POA: Diagnosis not present

## 2024-03-27 DIAGNOSIS — N1831 Chronic kidney disease, stage 3a: Secondary | ICD-10-CM | POA: Diagnosis not present

## 2024-05-01 DIAGNOSIS — M1712 Unilateral primary osteoarthritis, left knee: Secondary | ICD-10-CM | POA: Diagnosis not present

## 2024-05-23 DIAGNOSIS — M19012 Primary osteoarthritis, left shoulder: Secondary | ICD-10-CM | POA: Diagnosis not present

## 2024-06-06 DIAGNOSIS — M19011 Primary osteoarthritis, right shoulder: Secondary | ICD-10-CM | POA: Diagnosis not present

## 2024-06-25 ENCOUNTER — Other Ambulatory Visit: Payer: Self-pay | Admitting: Cardiology

## 2024-07-05 ENCOUNTER — Other Ambulatory Visit: Payer: Self-pay | Admitting: Cardiology

## 2024-07-24 DIAGNOSIS — E119 Type 2 diabetes mellitus without complications: Secondary | ICD-10-CM | POA: Diagnosis not present

## 2024-07-24 DIAGNOSIS — E039 Hypothyroidism, unspecified: Secondary | ICD-10-CM | POA: Diagnosis not present

## 2024-07-24 DIAGNOSIS — E782 Mixed hyperlipidemia: Secondary | ICD-10-CM | POA: Diagnosis not present

## 2024-07-31 DIAGNOSIS — J302 Other seasonal allergic rhinitis: Secondary | ICD-10-CM | POA: Diagnosis not present

## 2024-07-31 DIAGNOSIS — E039 Hypothyroidism, unspecified: Secondary | ICD-10-CM | POA: Diagnosis not present

## 2024-07-31 DIAGNOSIS — E782 Mixed hyperlipidemia: Secondary | ICD-10-CM | POA: Diagnosis not present

## 2024-07-31 DIAGNOSIS — K625 Hemorrhage of anus and rectum: Secondary | ICD-10-CM | POA: Diagnosis not present

## 2024-07-31 DIAGNOSIS — E1122 Type 2 diabetes mellitus with diabetic chronic kidney disease: Secondary | ICD-10-CM | POA: Diagnosis not present

## 2024-07-31 DIAGNOSIS — D649 Anemia, unspecified: Secondary | ICD-10-CM | POA: Diagnosis not present

## 2024-07-31 DIAGNOSIS — I129 Hypertensive chronic kidney disease with stage 1 through stage 4 chronic kidney disease, or unspecified chronic kidney disease: Secondary | ICD-10-CM | POA: Diagnosis not present

## 2024-07-31 DIAGNOSIS — N1831 Chronic kidney disease, stage 3a: Secondary | ICD-10-CM | POA: Diagnosis not present

## 2024-07-31 DIAGNOSIS — I480 Paroxysmal atrial fibrillation: Secondary | ICD-10-CM | POA: Diagnosis not present

## 2024-07-31 DIAGNOSIS — D490 Neoplasm of unspecified behavior of digestive system: Secondary | ICD-10-CM | POA: Diagnosis not present

## 2024-07-31 DIAGNOSIS — E1169 Type 2 diabetes mellitus with other specified complication: Secondary | ICD-10-CM | POA: Diagnosis not present

## 2024-07-31 DIAGNOSIS — I6521 Occlusion and stenosis of right carotid artery: Secondary | ICD-10-CM | POA: Diagnosis not present

## 2024-08-01 DIAGNOSIS — G8929 Other chronic pain: Secondary | ICD-10-CM | POA: Diagnosis not present

## 2024-08-01 DIAGNOSIS — M25562 Pain in left knee: Secondary | ICD-10-CM | POA: Diagnosis not present

## 2024-08-01 DIAGNOSIS — M1712 Unilateral primary osteoarthritis, left knee: Secondary | ICD-10-CM | POA: Diagnosis not present

## 2024-08-03 ENCOUNTER — Encounter: Payer: Self-pay | Admitting: Internal Medicine

## 2024-08-22 ENCOUNTER — Encounter: Payer: Self-pay | Admitting: Internal Medicine

## 2024-08-22 ENCOUNTER — Telehealth (INDEPENDENT_AMBULATORY_CARE_PROVIDER_SITE_OTHER): Payer: Self-pay

## 2024-08-22 ENCOUNTER — Ambulatory Visit (INDEPENDENT_AMBULATORY_CARE_PROVIDER_SITE_OTHER): Admitting: Internal Medicine

## 2024-08-22 VITALS — BP 147/82 | HR 71 | Temp 97.5°F | Ht 63.0 in | Wt 218.1 lb

## 2024-08-22 DIAGNOSIS — M19012 Primary osteoarthritis, left shoulder: Secondary | ICD-10-CM | POA: Diagnosis not present

## 2024-08-22 DIAGNOSIS — D649 Anemia, unspecified: Secondary | ICD-10-CM | POA: Diagnosis not present

## 2024-08-22 DIAGNOSIS — Z7901 Long term (current) use of anticoagulants: Secondary | ICD-10-CM

## 2024-08-22 DIAGNOSIS — R1319 Other dysphagia: Secondary | ICD-10-CM | POA: Diagnosis not present

## 2024-08-22 DIAGNOSIS — M75102 Unspecified rotator cuff tear or rupture of left shoulder, not specified as traumatic: Secondary | ICD-10-CM | POA: Diagnosis not present

## 2024-08-22 DIAGNOSIS — K219 Gastro-esophageal reflux disease without esophagitis: Secondary | ICD-10-CM

## 2024-08-22 NOTE — Progress Notes (Signed)
 Primary Care Physician:  Shona Norleen PEDLAR, MD Primary Gastroenterologist:  Dr. Cindie  Chief Complaint  Patient presents with   Anemia    Patient here today due to having anemia. Last hgb on July 25, 2024 was at 9.6. She takes po fe daily. Patient denies any sight of dark or bloody stools. She does have issues with shortness of breath with exertion, she has occasions of vertigo in the bed while lying down. Has fatigue, denies any other symptomatic symptoms of anemia.      HPI:   Martha Torres is a 78 y.o. female who presents to the clinic today by referral from her PCP Dr. Shona for evaluation.  Normocytic anemia: Blood work from 07/24/2024 revealed a hemoglobin of 9.6, MCV 83.  Patient denies any gross melena or hematochezia.  No unintentional weight loss.  No abdominal pain.  Last colonoscopy 02/26/2019 largely unremarkable besides sigmoid diverticulosis and hemorrhoids.  Biopsies negative for microscopic colitis.  Denies any generalized weakness/fatigue.  Does report mild dyspnea on exertion.  Chronic GERD: Well-controlled on omeprazole daily.  Does report some intermittent esophageal dysphagia.  Feels like food gets stuck in her epigastric region.  No chronic NSAID use.  Past Medical History:  Diagnosis Date   Carotid artery disease (HCC)    Right CEA in 2002; right carotid artery stent 2021   Chronic low back pain    MRI 2014: Spondylolisthesis of L4-L5 with foraminal narrowing, most prominent on the right and facet arthropathy.  L4-L5 surgery planned for 04/2013 with left-sided pedicle screw fixation.   Degenerative joint disease    Depression    Diarrhea, functional    DVT (deep venous thrombosis) (HCC)    Essential hypertension    Gastroesophageal reflux    H/o stricture & hiatal hernia   History of cardiac catheterization    Normal coronaries 2006   History of kidney stones    Hyperlipidemia    Hypothyroidism    MRSA (methicillin resistant Staphylococcus aureus) 05/2013    Neuropathy    right leg   Paroxysmal atrial fibrillation (HCC) 04/2013   PONV (postoperative nausea and vomiting)    Type 2 diabetes mellitus (HCC)     Past Surgical History:  Procedure Laterality Date   BIOPSY  02/26/2019   Procedure: BIOPSY;  Surgeon: Harvey Margo CROME, MD;  [facility];  Service: Endoscopy;;  random colon bx's   CAROTID ENDARTERECTOMY Right 10/25/2001   Subsequent to episode of syncope   COLONOSCOPY N/A 02/26/2019   Procedure: COLONOSCOPY;  Surgeon: Harvey Margo CROME, MD; left colon s/p biopsy, moderate diverticulosis in the rectosigmoid, sigmoid, descending, and transverse colon, external and internal hemorrhoids, no obvious source of diarrhea identified.  Random colon biopsies were benign.     LAMINECTOMY  2000   L5-S1 discectomy and laminectomy; 3 other surgical procedures subsequently performed   LAPAROSCOPIC CHOLECYSTECTOMY     SPINE SURGERY  05-23-13   X's 5 surgeries   TOTAL KNEE ARTHROPLASTY Right 08/23/2022   Procedure: TOTAL KNEE ARTHROPLASTY;  Surgeon: Melodi Lerner, MD;  Location: [facility];  Service: Orthopedics;  Laterality: Right;   TRANSCAROTID ARTERY REVASCULARIZATION  Right 04/21/2020   Procedure: TRANSCAROTID ARTERY REVASCULARIZATION RIGHT;  Surgeon: Eliza Lonni RAMAN, MD;  Location: [facility] OR;  Service: Vascular;  Laterality: Right;   VAGINAL HYSTERECTOMY      Current Outpatient Medications  Medication Sig Dispense Refill   Accu-Chek Softclix Lancets lancets 1 each 3 (three) times daily.     acetaminophen  (  TYLENOL ) 650 MG CR tablet Take 650 mg by mouth 2 (two) times daily.     albuterol  (VENTOLIN  HFA) 108 (90 Base) MCG/ACT inhaler Inhale 1-2 puffs into the lungs daily as needed.     ALPRAZolam  (XANAX ) 0.5 MG tablet Take 0.5 mg by mouth at bedtime.     amiodarone  (PACERONE ) 200 MG tablet take 1 tablet (200 milligram total) by mouth daily. 90 tablet 2   aspirin  EC 81 MG tablet Take 81 mg by mouth daily.     atenolol  (TENORMIN ) 50 MG tablet  Take 1 tablet (50 mg total) by mouth 2 (two) times daily. 180 tablet 1   chlorthalidone  (HYGROTON ) 25 MG tablet Take 25 mg by mouth daily.      gabapentin  (NEURONTIN ) 300 MG capsule Take 300 mg by mouth 2 (two) times daily.     glipiZIDE  (GLUCOTROL  XL) 5 MG 24 hr tablet Take 5 mg by mouth 2 (two) times daily.      levothyroxine  (SYNTHROID , LEVOTHROID) 50 MCG tablet Take 50 mcg by mouth daily before breakfast.      losartan  (COZAAR ) 100 MG tablet Take 100 mg by mouth daily.     MAGNESIUM  PO Take 165 mg by mouth daily.     omeprazole (PRILOSEC) 20 MG capsule Take 20 mg by mouth daily.     ondansetron  (ZOFRAN -ODT) 4 MG disintegrating tablet Take 4 mg by mouth every 8 (eight) hours as needed.     potassium chloride  SA (KLOR-CON ) 20 MEQ tablet Take 1-2 tablets (20-40 mEq total) by mouth See admin instructions. 40 meq in the morning and 20 meq in the bedtime     rivaroxaban  (XARELTO ) 20 MG TABS tablet Take 1 tablet (20 mg total) by mouth daily with supper. 28 tablet 0   rosuvastatin  (CRESTOR ) 10 MG tablet Take 10 mg by mouth at bedtime.     No current facility-administered medications for this visit.    Allergies as of 08/22/2024   (No Known Allergies)    Family History  Problem Relation Age of Onset   Heart attack Father        Also mother   Cancer - Prostate Father    Heart disease Father        Heart Disease before age 26   Hyperlipidemia Father    Hypertension Sister    Hyperlipidemia Sister    Cancer Sister    Stroke Mother    Heart disease Mother        Heart Disease before age 45   Hyperlipidemia Mother    Heart attack Mother    Diabetes Sister    Hyperlipidemia Sister    Hypertension Sister    Hyperlipidemia Brother    Cancer Brother        Luekemia   Hypertension Brother    Colon cancer Neg Hx     Social History   Socioeconomic History   Marital status: Widowed    Spouse name: Not on file   Number of children: 3   Years of education: Not on file   Highest  education level: Not on file  Occupational History   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[employer]  Tobacco Use   Smoking status: Former    Current packs/day: 0.00    Average packs/day: 0.2 packs/day for 43.7 years (10.9 ttl pk-yrs)    Types: Cigarettes    Start date: 03/10/1965    Quit date: 11/06/2008    Years since quitting: 15.8  Smokeless tobacco: Never  Vaping Use   Vaping status: Never Used  Substance and Sexual Activity   Alcohol use: Never    Alcohol/week: 1.0 standard drink of alcohol    Types: 1 Standard drinks or equivalent per week   Drug use: Never   Sexual activity: Not on file  Other Topics Concern   Not on file  Social History Narrative   Not on file   Social Drivers of Health   Financial Resource Strain: Low Risk  (02/09/2024)   Received from [facility]   Overall Financial Resource Strain (CARDIA)    Difficulty of Paying Living Expenses: Not very hard  Food Insecurity: No Food Insecurity (02/09/2024)   Received from [facility]   Hunger Vital Sign    Within the past 12 months, you worried that your food would run out before you got the money to buy more.: Never true    Within the past 12 months, the food you bought just didn't last and you didn't have money to get more.: Never true  Transportation Needs: No Transportation Needs (02/09/2024)   Received from [facility]   PRAPARE - Transportation    Lack of Transportation (Medical): No    Lack of Transportation (Non-Medical): No  Physical Activity: Not on file  Stress: Not on file  Social Connections: Not on file  Intimate Partner Violence: Not At Risk (02/09/2024)   Received from [facility]   Humiliation, Afraid, Rape, and Kick questionnaire    Within the last year, have you been afraid of your partner or ex-partner?: No    Within the last year, have you been humiliated or emotionally abused in other ways by your partner or ex-partner?: No     Within the last year, have you been kicked, hit, slapped, or otherwise physically hurt by your partner or ex-partner?: No    Within the last year, have you been raped or forced to have any kind of sexual activity by your partner or ex-partner?: No    Subjective: Review of Systems  Constitutional:  Negative for chills and fever.  HENT:  Negative for congestion and hearing loss.   Eyes:  Negative for blurred vision and double vision.  Respiratory:  Negative for cough and shortness of breath.   Cardiovascular:  Negative for chest pain and palpitations.  Gastrointestinal:  Negative for abdominal pain, blood in stool, constipation, diarrhea, heartburn, melena and vomiting.  Genitourinary:  Negative for dysuria and urgency.  Musculoskeletal:  Negative for joint pain and myalgias.  Skin:  Negative for itching and rash.  Neurological:  Negative for dizziness and headaches.  Psychiatric/Behavioral:  Negative for depression. The patient is not nervous/anxious.        Objective: BP (!) 147/82 (BP Location: Left Arm, Patient Position: Sitting, Cuff Size: Normal)   Pulse 71   Temp (!) 97.5 F (36.4 C) (Temporal)   Ht 5' 3 (1.6 m)   Wt 218 lb 1.6 oz (98.9 kg)   BMI 38.63 kg/m  Physical Exam Constitutional:      Appearance: Normal appearance. She is obese.  HENT:     Head: Normocephalic and atraumatic.  Eyes:     Extraocular Movements: Extraocular movements intact.     Conjunctiva/sclera: Conjunctivae normal.  Cardiovascular:     Rate and Rhythm: Normal rate and regular rhythm.  Pulmonary:     Effort: Pulmonary effort is normal.     Breath sounds: Normal breath sounds.  Abdominal:  General: Bowel sounds are normal.     Palpations: Abdomen is soft.  Musculoskeletal:        General: No swelling. Normal range of motion.     Cervical back: Normal range of motion and neck supple.  Skin:    General: Skin is warm and dry.     Coloration: Skin is not jaundiced.  Neurological:      General: No focal deficit present.     Mental Status: She is alert and oriented to person, place, and time.  Psychiatric:        Mood and Affect: Mood normal.        Behavior: Behavior normal.      Assessment/Plan:  1.  Normocytic anemia-etiology unclear.  Denies any gross melena hematochezia.  Will check iron studies as well as B12 and folate.  Will schedule for EGD and colonoscopy. The risks including infection, bleed, or perforation as well as benefits, limitations, alternatives and imponderables have been reviewed with the patient. Potential for esophagea l dilation, biopsy, etc. have also been reviewed.  Questions have been answered. All parties agreeable.  Patient will need to hold her Xarelto  x 2 days prior to procedures.  We will reach out to her cardiologist in this regard.  Appreciate Dr. McDowell's help immensely.  2.  Chronic GERD-relatively well-controlled omeprazole daily.  Will continue.  3.  Esophageal dysphagia-EGD as above.  Will perform esophageal dilation depending on findings.  Thank you Dr. Shona for the kind referral.  08/22/2024 1:35 PM   Disclaimer: This note was dictated with voice recognition software. Similar sounding words can inadvertently be transcribed and may not be corrected upon review.

## 2024-08-22 NOTE — H&P (View-Only) (Signed)
 Primary Care Physician:  Shona Norleen PEDLAR, MD Primary Gastroenterologist:  Dr. Cindie  Chief Complaint  Patient presents with   Anemia    Patient here today due to having anemia. Last hgb on July 25, 2024 was at 9.6. She takes po fe daily. Patient denies any sight of dark or bloody stools. She does have issues with shortness of breath with exertion, she has occasions of vertigo in the bed while lying down. Has fatigue, denies any other symptomatic symptoms of anemia.      HPI:   Martha Torres is a 78 y.o. female who presents to the clinic today by referral from her PCP Dr. Shona for evaluation.  Normocytic anemia: Blood work from 07/24/2024 revealed a hemoglobin of 9.6, MCV 83.  Patient denies any gross melena or hematochezia.  No unintentional weight loss.  No abdominal pain.  Last colonoscopy 02/26/2019 largely unremarkable besides sigmoid diverticulosis and hemorrhoids.  Biopsies negative for microscopic colitis.  Denies any generalized weakness/fatigue.  Does report mild dyspnea on exertion.  Chronic GERD: Well-controlled on omeprazole daily.  Does report some intermittent esophageal dysphagia.  Feels like food gets stuck in her epigastric region.  No chronic NSAID use.  Past Medical History:  Diagnosis Date   Carotid artery disease (HCC)    Right CEA in 2002; right carotid artery stent 2021   Chronic low back pain    MRI 2014: Spondylolisthesis of L4-L5 with foraminal narrowing, most prominent on the right and facet arthropathy.  L4-L5 surgery planned for 04/2013 with left-sided pedicle screw fixation.   Degenerative joint disease    Depression    Diarrhea, functional    DVT (deep venous thrombosis) (HCC)    Essential hypertension    Gastroesophageal reflux    H/o stricture & hiatal hernia   History of cardiac catheterization    Normal coronaries 2006   History of kidney stones    Hyperlipidemia    Hypothyroidism    MRSA (methicillin resistant Staphylococcus aureus) 05/2013    Neuropathy    right leg   Paroxysmal atrial fibrillation (HCC) 04/2013   PONV (postoperative nausea and vomiting)    Type 2 diabetes mellitus (HCC)     Past Surgical History:  Procedure Laterality Date   BIOPSY  02/26/2019   Procedure: BIOPSY;  Surgeon: Harvey Margo CROME, MD;  Location: [facility];  Service: Endoscopy;;  random colon bx's   CAROTID ENDARTERECTOMY Right 10/25/2001   Subsequent to episode of syncope   COLONOSCOPY N/A 02/26/2019   Procedure: COLONOSCOPY;  Surgeon: Harvey Margo CROME, MD; left colon s/p biopsy, moderate diverticulosis in the rectosigmoid, sigmoid, descending, and transverse colon, external and internal hemorrhoids, no obvious source of diarrhea identified.  Random colon biopsies were benign.     LAMINECTOMY  2000   L5-S1 discectomy and laminectomy; 3 other surgical procedures subsequently performed   LAPAROSCOPIC CHOLECYSTECTOMY     SPINE SURGERY  05-23-13   X's 5 surgeries   TOTAL KNEE ARTHROPLASTY Right 08/23/2022   Procedure: TOTAL KNEE ARTHROPLASTY;  Surgeon: Melodi Lerner, MD;  Location: [facility];  Service: Orthopedics;  Laterality: Right;   TRANSCAROTID ARTERY REVASCULARIZATION  Right 04/21/2020   Procedure: TRANSCAROTID ARTERY REVASCULARIZATION RIGHT;  Surgeon: Eliza Lonni RAMAN, MD;  Location: [facility] OR;  Service: Vascular;  Laterality: Right;   VAGINAL HYSTERECTOMY      Current Outpatient Medications  Medication Sig Dispense Refill   Accu-Chek Softclix Lancets lancets 1 each 3 (three) times daily.     acetaminophen  (  TYLENOL ) 650 MG CR tablet Take 650 mg by mouth 2 (two) times daily.     albuterol  (VENTOLIN  HFA) 108 (90 Base) MCG/ACT inhaler Inhale 1-2 puffs into the lungs daily as needed.     ALPRAZolam  (XANAX ) 0.5 MG tablet Take 0.5 mg by mouth at bedtime.     amiodarone  (PACERONE ) 200 MG tablet take 1 tablet (200 milligram total) by mouth daily. 90 tablet 2   aspirin  EC 81 MG tablet Take 81 mg by mouth daily.     atenolol  (TENORMIN ) 50 MG tablet  Take 1 tablet (50 mg total) by mouth 2 (two) times daily. 180 tablet 1   chlorthalidone  (HYGROTON ) 25 MG tablet Take 25 mg by mouth daily.      gabapentin  (NEURONTIN ) 300 MG capsule Take 300 mg by mouth 2 (two) times daily.     glipiZIDE  (GLUCOTROL  XL) 5 MG 24 hr tablet Take 5 mg by mouth 2 (two) times daily.      levothyroxine  (SYNTHROID , LEVOTHROID) 50 MCG tablet Take 50 mcg by mouth daily before breakfast.      losartan  (COZAAR ) 100 MG tablet Take 100 mg by mouth daily.     MAGNESIUM  PO Take 165 mg by mouth daily.     omeprazole (PRILOSEC) 20 MG capsule Take 20 mg by mouth daily.     ondansetron  (ZOFRAN -ODT) 4 MG disintegrating tablet Take 4 mg by mouth every 8 (eight) hours as needed.     potassium chloride  SA (KLOR-CON ) 20 MEQ tablet Take 1-2 tablets (20-40 mEq total) by mouth See admin instructions. 40 meq in the morning and 20 meq in the bedtime     rivaroxaban  (XARELTO ) 20 MG TABS tablet Take 1 tablet (20 mg total) by mouth daily with supper. 28 tablet 0   rosuvastatin  (CRESTOR ) 10 MG tablet Take 10 mg by mouth at bedtime.     No current facility-administered medications for this visit.    Allergies as of 08/22/2024   (No Known Allergies)    Family History  Problem Relation Age of Onset   Heart attack Father        Also mother   Cancer - Prostate Father    Heart disease Father        Heart Disease before age 26   Hyperlipidemia Father    Hypertension Sister    Hyperlipidemia Sister    Cancer Sister    Stroke Mother    Heart disease Mother        Heart Disease before age 45   Hyperlipidemia Mother    Heart attack Mother    Diabetes Sister    Hyperlipidemia Sister    Hypertension Sister    Hyperlipidemia Brother    Cancer Brother        Luekemia   Hypertension Brother    Colon cancer Neg Hx     Social History   Socioeconomic History   Marital status: Widowed    Spouse name: Not on file   Number of children: 3   Years of education: Not on file   Highest  education level: Not on file  Occupational History   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[employer]  Tobacco Use   Smoking status: Former    Current packs/day: 0.00    Average packs/day: 0.2 packs/day for 43.7 years (10.9 ttl pk-yrs)    Types: Cigarettes    Start date: 03/10/1965    Quit date: 11/06/2008    Years since quitting: 15.8  Smokeless tobacco: Never  Vaping Use   Vaping status: Never Used  Substance and Sexual Activity   Alcohol use: Never    Alcohol/week: 1.0 standard drink of alcohol    Types: 1 Standard drinks or equivalent per week   Drug use: Never   Sexual activity: Not on file  Other Topics Concern   Not on file  Social History Narrative   Not on file   Social Drivers of Health   Financial Resource Strain: Low Risk  (02/09/2024)   Received from [facility]   Overall Financial Resource Strain (CARDIA)    Difficulty of Paying Living Expenses: Not very hard  Food Insecurity: No Food Insecurity (02/09/2024)   Received from [facility]   Hunger Vital Sign    Within the past 12 months, you worried that your food would run out before you got the money to buy more.: Never true    Within the past 12 months, the food you bought just didn't last and you didn't have money to get more.: Never true  Transportation Needs: No Transportation Needs (02/09/2024)   Received from [facility]   PRAPARE - Transportation    Lack of Transportation (Medical): No    Lack of Transportation (Non-Medical): No  Physical Activity: Not on file  Stress: Not on file  Social Connections: Not on file  Intimate Partner Violence: Not At Risk (02/09/2024)   Received from [facility]   Humiliation, Afraid, Rape, and Kick questionnaire    Within the last year, have you been afraid of your partner or ex-partner?: No    Within the last year, have you been humiliated or emotionally abused in other ways by your partner or ex-partner?: No     Within the last year, have you been kicked, hit, slapped, or otherwise physically hurt by your partner or ex-partner?: No    Within the last year, have you been raped or forced to have any kind of sexual activity by your partner or ex-partner?: No    Subjective: Review of Systems  Constitutional:  Negative for chills and fever.  HENT:  Negative for congestion and hearing loss.   Eyes:  Negative for blurred vision and double vision.  Respiratory:  Negative for cough and shortness of breath.   Cardiovascular:  Negative for chest pain and palpitations.  Gastrointestinal:  Negative for abdominal pain, blood in stool, constipation, diarrhea, heartburn, melena and vomiting.  Genitourinary:  Negative for dysuria and urgency.  Musculoskeletal:  Negative for joint pain and myalgias.  Skin:  Negative for itching and rash.  Neurological:  Negative for dizziness and headaches.  Psychiatric/Behavioral:  Negative for depression. The patient is not nervous/anxious.        Objective: BP (!) 147/82 (BP Location: Left Arm, Patient Position: Sitting, Cuff Size: Normal)   Pulse 71   Temp (!) 97.5 F (36.4 C) (Temporal)   Ht 5' 3 (1.6 m)   Wt 218 lb 1.6 oz (98.9 kg)   BMI 38.63 kg/m  Physical Exam Constitutional:      Appearance: Normal appearance. She is obese.  HENT:     Head: Normocephalic and atraumatic.  Eyes:     Extraocular Movements: Extraocular movements intact.     Conjunctiva/sclera: Conjunctivae normal.  Cardiovascular:     Rate and Rhythm: Normal rate and regular rhythm.  Pulmonary:     Effort: Pulmonary effort is normal.     Breath sounds: Normal breath sounds.  Abdominal:  General: Bowel sounds are normal.     Palpations: Abdomen is soft.  Musculoskeletal:        General: No swelling. Normal range of motion.     Cervical back: Normal range of motion and neck supple.  Skin:    General: Skin is warm and dry.     Coloration: Skin is not jaundiced.  Neurological:      General: No focal deficit present.     Mental Status: She is alert and oriented to person, place, and time.  Psychiatric:        Mood and Affect: Mood normal.        Behavior: Behavior normal.      Assessment/Plan:  1.  Normocytic anemia-etiology unclear.  Denies any gross melena hematochezia.  Will check iron studies as well as B12 and folate.  Will schedule for EGD and colonoscopy. The risks including infection, bleed, or perforation as well as benefits, limitations, alternatives and imponderables have been reviewed with the patient. Potential for esophagea l dilation, biopsy, etc. have also been reviewed.  Questions have been answered. All parties agreeable.  Patient will need to hold her Xarelto  x 2 days prior to procedures.  We will reach out to her cardiologist in this regard.  Appreciate Dr. McDowell's help immensely.  2.  Chronic GERD-relatively well-controlled omeprazole daily.  Will continue.  3.  Esophageal dysphagia-EGD as above.  Will perform esophageal dilation depending on findings.  Thank you Dr. Shona for the kind referral.  08/22/2024 1:35 PM   Disclaimer: This note was dictated with voice recognition software. Similar sounding words can inadvertently be transcribed and may not be corrected upon review.

## 2024-08-22 NOTE — Telephone Encounter (Signed)
  Request for patient to stop medication prior to procedure or is needing cleareance  08/22/24  Martha Torres 09/04/1946  What type of surgery is being performed? Colonoscopy/egd/dilation  When is surgery scheduled? TBD  What type of clearance is required (medical or pharmacy to hold medication or both? medication  Are there any medications that need to be held prior to surgery and how long? Xarelto  x2 days prior  Name of physician performing surgery?  Dr. Cindie Rouse [facility] at [facility] Phone: [phone], option 5 Fax: [phone]  Anesthesia type (none, local, MAC, general)? MAC

## 2024-08-22 NOTE — Patient Instructions (Signed)
 I am going to check blood work at [facility] to further evaluate your anemia.    We will schedule you for upper endoscopy and colonoscopy to further evaluate your anemia as well.  We will reach out to your cardiologist in regards to holding your Xarelto  x 2 days prior to procedures.  Once we have heard back, we will call you to schedule procedures.  Continue on omeprazole.  It was very nice meeting you today.  Dr. Cindie

## 2024-08-23 LAB — B12 AND FOLATE PANEL
Folate: 21.5 ng/mL
Vitamin B-12: 398 pg/mL (ref 200–1100)

## 2024-08-23 LAB — IRON,TIBC AND FERRITIN PANEL
%SAT: 4 % — ABNORMAL LOW (ref 16–45)
Ferritin: 10 ng/mL — ABNORMAL LOW (ref 16–288)
Iron: 16 ug/dL — ABNORMAL LOW (ref 45–160)
TIBC: 426 ug/dL (ref 250–450)

## 2024-08-23 NOTE — Telephone Encounter (Signed)
 Patient with diagnosis of atrial fibrillation on Xarelto  for anticoagulation.    What type of surgery is being performed? Colonoscopy/egd/dilation   When is surgery scheduled? TBD   CHA2DS2-VASc Score = 6   This indicates a 9.7% annual risk of stroke. The patient's score is based upon: CHF History: 0 HTN History: 1 Diabetes History: 1 Stroke History: 0 Vascular Disease History: 1 Age Score: 2 Gender Score: 1   Chart notes history of DVT in 2012  CrCl 64 Platelet count 363  Patient has not had an Afib/aflutter ablation within the last 3 months or DCCV within the last 30 days   Per office protocol, patient can hold Xarelto  for 2 days prior to procedure.   Patient will not need bridging with Lovenox (enoxaparin) around procedure.  **This guidance is not considered finalized until pre-operative APP has relayed final recommendations.**

## 2024-08-23 NOTE — Telephone Encounter (Signed)
   Patient Name: Martha Torres  DOB: 19-May-1946 MRN: [id number]  Primary Cardiologist: Jayson Sierras, MD  Clinical pharmacists have reviewed the patient's past medical history, labs, and current medications as part of preoperative protocol coverage. The following recommendations have been made:  Per office protocol, patient can hold Xarelto  for 2 days prior to procedure.   Patient will not need bridging with Lovenox (enoxaparin) around procedure.  I will route this recommendation to the requesting party via Epic fax function and remove from pre-op pool.  Please call with questions.  Wyn Raddle, Jackee Shove, NP 08/23/2024, 10:03 AM

## 2024-08-28 NOTE — Telephone Encounter (Signed)
 Called pt, no answer. LVM to call back and schedule procedure.

## 2024-08-29 ENCOUNTER — Encounter: Payer: Self-pay | Admitting: *Deleted

## 2024-08-29 NOTE — Telephone Encounter (Signed)
 Pt left VM returning call to schedule procedure

## 2024-08-29 NOTE — Telephone Encounter (Signed)
 [facility] to give pre-op appointment  Monday 09/03/24 at 11:30 am

## 2024-08-29 NOTE — Telephone Encounter (Signed)
 Pt has been scheduled for 09/05/24. Instructions printed and placed up front with sample of clenpiq to be picked up

## 2024-08-30 ENCOUNTER — Other Ambulatory Visit: Payer: Self-pay | Admitting: Vascular Surgery

## 2024-08-30 DIAGNOSIS — I6523 Occlusion and stenosis of bilateral carotid arteries: Secondary | ICD-10-CM

## 2024-08-30 NOTE — Telephone Encounter (Signed)
 Pt informed to pre-op appointment

## 2024-09-03 ENCOUNTER — Encounter (HOSPITAL_COMMUNITY)
Admission: RE | Admit: 2024-09-03 | Discharge: 2024-09-03 | Disposition: A | Discharge status: Home / Self Care | Source: Ambulatory Visit | Attending: Internal Medicine | Admitting: Internal Medicine

## 2024-09-03 ENCOUNTER — Other Ambulatory Visit: Payer: Self-pay

## 2024-09-03 ENCOUNTER — Encounter (HOSPITAL_COMMUNITY): Payer: Self-pay

## 2024-09-05 ENCOUNTER — Ambulatory Visit (HOSPITAL_COMMUNITY)

## 2024-09-05 ENCOUNTER — Ambulatory Visit (HOSPITAL_COMMUNITY)
Admission: RE | Admit: 2024-09-05 | Discharge: 2024-09-05 | Disposition: A | Discharge status: Home / Self Care | Attending: Internal Medicine | Admitting: Internal Medicine

## 2024-09-05 ENCOUNTER — Encounter (HOSPITAL_COMMUNITY): Payer: Self-pay | Admitting: Internal Medicine

## 2024-09-05 ENCOUNTER — Encounter (HOSPITAL_COMMUNITY)
Admission: RE | Disposition: A | Discharge status: Home / Self Care | Payer: Self-pay | Source: Home / Self Care | Attending: Internal Medicine

## 2024-09-05 ENCOUNTER — Ambulatory Visit (HOSPITAL_BASED_OUTPATIENT_CLINIC_OR_DEPARTMENT_OTHER)

## 2024-09-05 ENCOUNTER — Other Ambulatory Visit: Payer: Self-pay

## 2024-09-05 DIAGNOSIS — Z7984 Long term (current) use of oral hypoglycemic drugs: Secondary | ICD-10-CM | POA: Diagnosis not present

## 2024-09-05 DIAGNOSIS — Z87442 Personal history of urinary calculi: Secondary | ICD-10-CM | POA: Insufficient documentation

## 2024-09-05 DIAGNOSIS — I4891 Unspecified atrial fibrillation: Secondary | ICD-10-CM

## 2024-09-05 DIAGNOSIS — K648 Other hemorrhoids: Secondary | ICD-10-CM | POA: Insufficient documentation

## 2024-09-05 DIAGNOSIS — I480 Paroxysmal atrial fibrillation: Secondary | ICD-10-CM | POA: Insufficient documentation

## 2024-09-05 DIAGNOSIS — K219 Gastro-esophageal reflux disease without esophagitis: Secondary | ICD-10-CM | POA: Diagnosis not present

## 2024-09-05 DIAGNOSIS — Z86718 Personal history of other venous thrombosis and embolism: Secondary | ICD-10-CM | POA: Insufficient documentation

## 2024-09-05 DIAGNOSIS — K222 Esophageal obstruction: Secondary | ICD-10-CM

## 2024-09-05 DIAGNOSIS — E119 Type 2 diabetes mellitus without complications: Secondary | ICD-10-CM | POA: Insufficient documentation

## 2024-09-05 DIAGNOSIS — E039 Hypothyroidism, unspecified: Secondary | ICD-10-CM

## 2024-09-05 DIAGNOSIS — Z79899 Other long term (current) drug therapy: Secondary | ICD-10-CM | POA: Insufficient documentation

## 2024-09-05 DIAGNOSIS — K2970 Gastritis, unspecified, without bleeding: Secondary | ICD-10-CM | POA: Insufficient documentation

## 2024-09-05 DIAGNOSIS — R1310 Dysphagia, unspecified: Secondary | ICD-10-CM | POA: Diagnosis not present

## 2024-09-05 DIAGNOSIS — Z79890 Hormone replacement therapy (postmenopausal): Secondary | ICD-10-CM | POA: Diagnosis not present

## 2024-09-05 DIAGNOSIS — R1314 Dysphagia, pharyngoesophageal phase: Secondary | ICD-10-CM | POA: Insufficient documentation

## 2024-09-05 DIAGNOSIS — Z7901 Long term (current) use of anticoagulants: Secondary | ICD-10-CM | POA: Insufficient documentation

## 2024-09-05 DIAGNOSIS — Z833 Family history of diabetes mellitus: Secondary | ICD-10-CM | POA: Diagnosis not present

## 2024-09-05 DIAGNOSIS — K5730 Diverticulosis of large intestine without perforation or abscess without bleeding: Secondary | ICD-10-CM

## 2024-09-05 DIAGNOSIS — I10 Essential (primary) hypertension: Secondary | ICD-10-CM | POA: Insufficient documentation

## 2024-09-05 DIAGNOSIS — K317 Polyp of stomach and duodenum: Secondary | ICD-10-CM | POA: Insufficient documentation

## 2024-09-05 DIAGNOSIS — Z7982 Long term (current) use of aspirin: Secondary | ICD-10-CM | POA: Insufficient documentation

## 2024-09-05 DIAGNOSIS — Z87891 Personal history of nicotine dependence: Secondary | ICD-10-CM | POA: Diagnosis not present

## 2024-09-05 DIAGNOSIS — F32A Depression, unspecified: Secondary | ICD-10-CM | POA: Insufficient documentation

## 2024-09-05 DIAGNOSIS — K449 Diaphragmatic hernia without obstruction or gangrene: Secondary | ICD-10-CM | POA: Diagnosis not present

## 2024-09-05 DIAGNOSIS — D509 Iron deficiency anemia, unspecified: Secondary | ICD-10-CM | POA: Insufficient documentation

## 2024-09-05 HISTORY — PX: ESOPHAGEAL DILATION: SHX303

## 2024-09-05 HISTORY — PX: ESOPHAGOGASTRODUODENOSCOPY: SHX5428

## 2024-09-05 HISTORY — PX: COLONOSCOPY: SHX5424

## 2024-09-05 LAB — GLUCOSE, CAPILLARY: Glucose-Capillary: 141 mg/dL — ABNORMAL HIGH (ref 70–99)

## 2024-09-05 SURGERY — COLONOSCOPY
Anesthesia: General

## 2024-09-05 MED ORDER — LIDOCAINE 2% (20 MG/ML) 5 ML SYRINGE
INTRAMUSCULAR | Status: DC | PRN
  Administered 2024-09-05: 60 mg via INTRAVENOUS

## 2024-09-05 MED ORDER — LACTATED RINGERS IV SOLN: INTRAVENOUS | Status: DC | PRN

## 2024-09-05 MED ORDER — EPHEDRINE SULFATE-NACL 50-0.9 MG/10ML-% IV SOSY
PREFILLED_SYRINGE | INTRAVENOUS | Status: DC | PRN
  Administered 2024-09-05: 10 mg via INTRAVENOUS

## 2024-09-05 MED ORDER — FERROUS SULFATE 325 (65 FE) MG PO TBEC
325.0000 mg | DELAYED_RELEASE_TABLET | Freq: Every day | ORAL | 11 refills | Status: AC
Start: 2024-09-05 — End: 2025-09-05

## 2024-09-05 MED ORDER — PROPOFOL 500 MG/50ML IV EMUL
INTRAVENOUS | Status: DC | PRN
  Administered 2024-09-05: 150 ug/kg/min via INTRAVENOUS
  Administered 2024-09-05: 50 mg via INTRAVENOUS
  Administered 2024-09-05: 90 mg via INTRAVENOUS

## 2024-09-05 NOTE — Interval H&P Note (Signed)
 History and Physical Interval Note:  09/05/2024 8:01 AM  Martha Torres  has presented today for surgery, with the diagnosis of anemia,dysphagia.  The various methods of treatment have been discussed with the patient and family. After consideration of risks, benefits and other options for treatment, the patient has consented to  Procedure(s) with comments: COLONOSCOPY (N/A) - 10:00 am, asa 3 EGD (ESOPHAGOGASTRODUODENOSCOPY) (N/A) DILATION, ESOPHAGUS (N/A) as a surgical intervention.  The patient's history has been reviewed, patient examined, no change in status, stable for surgery.  I have reviewed the patient's chart and labs.  Questions were answered to the patient's satisfaction.     Carlin MARLA Hasty

## 2024-09-05 NOTE — Transfer of Care (Signed)
 Immediate Anesthesia Transfer of Care Note  Patient: Martha Torres  Procedure(s) Performed: COLONOSCOPY EGD (ESOPHAGOGASTRODUODENOSCOPY) DILATION, ESOPHAGUS  Patient Location: Short Stay  Anesthesia Type:General  Level of Consciousness: awake, alert , oriented, and patient cooperative  Airway & Oxygen Therapy: Patient Spontanous Breathing  Post-op Assessment: Report given to RN, Post -op Vital signs reviewed and stable, and Patient moving all extremities X 4  Post vital signs: Reviewed and stable  Last Vitals:  Vitals Value Taken Time  BP 115/40 09/05/24 09:30  Temp 36.7 C 09/05/24 09:30  Pulse 58 09/05/24 09:30  Resp 20 09/05/24 09:30  SpO2 99 % 09/05/24 09:30    Last Pain:  Vitals:   09/05/24 0930  TempSrc: Axillary  PainSc: 0-No pain      Patients Stated Pain Goal: 8 (09/05/24 0749)  Complications: No notable events documented.

## 2024-09-05 NOTE — Discharge Instructions (Addendum)
 EGD Discharge instructions Please read the instructions outlined below and refer to this sheet in the next few weeks. These discharge instructions provide you with general information on caring for yourself after you leave the hospital. Your doctor may also give you specific instructions. While your treatment has been planned according to the most current medical practices available, unavoidable complications occasionally occur. If you have any problems or questions after discharge, please call your doctor. ACTIVITY You may resume your regular activity but move at a slower pace for the next 24 hours.  Take frequent rest periods for the next 24 hours.  Walking will help expel (get rid of) the air and reduce the bloated feeling in your abdomen.  No driving for 24 hours (because of the anesthesia (medicine) used during the test).  You may shower.  Do not sign any important legal documents or operate any machinery for 24 hours (because of the anesthesia used during the test).  NUTRITION Drink plenty of fluids.  You may resume your normal diet.  Begin with a light meal and progress to your normal diet.  Avoid alcoholic beverages for 24 hours or as instructed by your caregiver.  MEDICATIONS You may resume your normal medications unless your caregiver tells you otherwise.  WHAT YOU CAN EXPECT TODAY You may experience abdominal discomfort such as a feeling of fullness or "gas" pains.  FOLLOW-UP Your doctor will discuss the results of your test with you.  SEEK IMMEDIATE MEDICAL ATTENTION IF ANY OF THE FOLLOWING OCCUR: Excessive nausea (feeling sick to your stomach) and/or vomiting.  Severe abdominal pain and distention (swelling).  Trouble swallowing.  Temperature over 101 F (37.8 C).  Rectal bleeding or vomiting of blood.      Colonoscopy Discharge Instructions  Read the instructions outlined below and refer to this sheet in the next few weeks. These discharge instructions provide you  with general information on caring for yourself after you leave the hospital. Your doctor may also give you specific instructions. While your treatment has been planned according to the most current medical practices available, unavoidable complications occasionally occur.   ACTIVITY You may resume your regular activity, but move at a slower pace for the next 24 hours.  Take frequent rest periods for the next 24 hours.  Walking will help get rid of the air and reduce the bloated feeling in your belly (abdomen).  No driving for 24 hours (because of the medicine (anesthesia) used during the test).   Do not sign any important legal documents or operate any machinery for 24 hours (because of the anesthesia used during the test).  NUTRITION Drink plenty of fluids.  You may resume your normal diet as instructed by your doctor.  Begin with a light meal and progress to your normal diet. Heavy or fried foods are harder to digest and may make you feel sick to your stomach (nauseated).  Avoid alcoholic beverages for 24 hours or as instructed.  MEDICATIONS You may resume your normal medications unless your doctor tells you otherwise.  WHAT YOU CAN EXPECT TODAY Some feelings of bloating in the abdomen.  Passage of more gas than usual.  Spotting of blood in your stool or on the toilet paper.  IF YOU HAD POLYPS REMOVED DURING THE COLONOSCOPY: No aspirin  products for 7 days or as instructed.  No alcohol for 7 days or as instructed.  Eat a soft diet for the next 24 hours.  FINDING OUT THE RESULTS OF YOUR TEST Not all test results  are available during your visit. If your test results are not back during the visit, make an appointment with your caregiver to find out the results. Do not assume everything is normal if you have not heard from your caregiver or the medical facility. It is important for you to follow up on all of your test results.  SEEK IMMEDIATE MEDICAL ATTENTION IF: You have more than a  spotting of blood in your stool.  Your belly is swollen (abdominal distention).  You are nauseated or vomiting.  You have a temperature over 101.  You have abdominal pain or discomfort that is severe or gets worse throughout the day.   Your EGD revealed mild amount inflammation in your stomach.  I took biopsies of this to rule out infection with a bacteria called H. pylori.  3 small polyps in your stomach as well which I removed successfully.  Await pathology results, my office will contact you.  You have a large hiatal hernia.  Mild tightening of your esophagus which I stretched out today.  Small bowel is normal.  Continue on omeprazole.  Your colonoscopy was relatively unremarkable.  I did not find any polyps or evidence of colon cancer.  I recommend repeating colonoscopy in 5 years for colon cancer screening purposes.    You do have diverticulosis and internal hemorrhoids. I would recommend increasing fiber in your diet or adding OTC Benefiber/Metamucil. Be sure to drink at least 4 to 6 glasses of water  daily.   Your blood work showed your iron levels are low.  I want you start taking oral iron ferrous sulfate  325 mg daily.  We will recheck your blood work in 3 months.  Follow-up in GI office for in 2 to 3 months   I hope you have a great rest of your week!  Carlin POUR. Cindie, D.O. Gastroenterology and Hepatology [facility]

## 2024-09-05 NOTE — Anesthesia Preprocedure Evaluation (Addendum)
 Anesthesia Evaluation  Patient identified by MRN, date of birth, ID band Patient awake    Reviewed: Allergy & Precautions, H&P , NPO status , Patient's Chart, lab work & pertinent test results  History of Anesthesia Complications (+) PONV and history of anesthetic complications  Airway Mallampati: II  TM Distance: >3 FB Neck ROM: Full    Dental no notable dental hx.    Pulmonary former smoker   Pulmonary exam normal breath sounds clear to auscultation       Cardiovascular hypertension, Normal cardiovascular exam+ dysrhythmias Atrial Fibrillation  Rhythm:Regular Rate:Normal     Neuro/Psych  PSYCHIATRIC DISORDERS  Depression    negative neurological ROS     GI/Hepatic Neg liver ROS,GERD  ,,  Endo/Other  diabetesHypothyroidism    Renal/GU negative Renal ROS  negative genitourinary   Musculoskeletal  (+) Arthritis ,    Abdominal   Peds negative pediatric ROS (+)  Hematology negative hematology ROS (+)   Anesthesia Other Findings   Reproductive/Obstetrics negative OB ROS                              Anesthesia Physical Anesthesia Plan  ASA: 3  Anesthesia Plan: General   Post-op Pain Management:    Induction: Intravenous  PONV Risk Score and Plan:   Airway Management Planned: Nasal Cannula  Additional Equipment:   Intra-op Plan:   Post-operative Plan:   Informed Consent: I have reviewed the patients History and Physical, chart, labs and discussed the procedure including the risks, benefits and alternatives for the proposed anesthesia with the patient or authorized representative who has indicated his/her understanding and acceptance.     Dental advisory given  Plan Discussed with: CRNA  Anesthesia Plan Comments:         Anesthesia Quick Evaluation

## 2024-09-05 NOTE — Op Note (Signed)
 [facility] Patient Name: Martha Torres Procedure Date: 09/05/2024 8:31 AM MRN: [id number] Date of Birth: 1946-08-30 Attending MD: Carlin POUR. Cindie HAS, [id number] CSN: [id number] Age: 78 Admit Type: Outpatient Procedure:                Colonoscopy Indications:              Iron deficiency anemia Providers:                Carlin POUR. Cindie, DO, Leandrew Edelman RN, RN, Daphne Mulch Technician, Technician Referring MD:              Medicines:                See the Anesthesia note for documentation of the                            administered medications Complications:            No immediate complications. Estimated Blood Loss:     Estimated blood loss: none. Procedure:                Pre-Anesthesia Assessment:                           - The anesthesia plan was to use monitored                            anesthesia care (MAC).                           After obtaining informed consent, the colonoscope                            was passed under direct vision. Throughout the                            procedure, the patient's blood pressure, pulse, and                            oxygen saturations were monitored continuously. The                            [id number]-HQ190L ([id number]) Peds Colon was introduced                            through the anus and advanced to the the cecum,                            identified by appendiceal orifice and ileocecal                            valve. The colonoscopy was performed without                            difficulty. The patient tolerated the procedure  well. The quality of the bowel preparation was                            evaluated using the BBPS [facility] Bowel Preparation                            Scale) with scores of: Right Colon = 2 (minor                            amount of residual staining, small fragments of                            stool and/or opaque liquid, but mucosa  seen well),                            Transverse Colon = 2 (minor amount of residual                            staining, small fragments of stool and/or opaque                            liquid, but mucosa seen well) and Left Colon = 2                            (minor amount of residual staining, small fragments                            of stool and/or opaque liquid, but mucosa seen                            well). The total BBPS score equals 6. The quality                            of the bowel preparation was good. Scope In: 9:08:41 AM Scope Out: 9:22:46 AM Scope Withdrawal Time: 0 hours 10 minutes 58 seconds  Total Procedure Duration: 0 hours 14 minutes 5 seconds  Findings:      Non-bleeding internal hemorrhoids were found.      Many large-mouthed and small-mouthed diverticula were found in the       sigmoid colon, descending colon and transverse colon.      A moderate amount of stool was found in the entire colon, making       visualization difficult. Lavage of the area was performed using copious       amounts of sterile water , resulting in clearance with good visualization.      The exam was otherwise without abnormality. Impression:               - Non-bleeding internal hemorrhoids.                           - Diverticulosis in the sigmoid colon, in the                            descending colon  and in the transverse colon.                           - Stool in the entire examined colon.                           - The examination was otherwise normal.                           - No specimens collected. Moderate Sedation:      Per Anesthesia Care Recommendation:           - Patient has a contact number available for                            emergencies. The signs and symptoms of potential                            delayed complications were discussed with the                            patient. Return to normal activities tomorrow.                            Written  discharge instructions were provided to the                            patient.                           - Resume previous diet.                           - Continue present medications.                           - Return to GI clinic in 3 months.                           - Prep not ideal but will suffice for today's                            examination. Would repeat colonoscopy for screening                            in 5 years as previously recommended Procedure Code(s):        --- Professional ---                           [phone], Colonoscopy, flexible; diagnostic, including                            collection of specimen(s) by brushing or washing,                            when performed (separate procedure) Diagnosis Code(s):        ---  Professional ---                           K64.8, Other hemorrhoids                           D50.9, Iron deficiency anemia, unspecified                           K57.30, Diverticulosis of large intestine without                            perforation or abscess without bleeding CPT copyright 2022 American Medical Association. All rights reserved. The codes documented in this report are preliminary and upon coder review may  be revised to meet current compliance requirements. Carlin POUR. Cindie, DO Carlin POUR. Cindie, DO 09/05/2024 9:55:21 AM This report has been signed electronically. Number of Addenda: 0

## 2024-09-05 NOTE — Anesthesia Postprocedure Evaluation (Signed)
 Anesthesia Post Note  Patient: Martha Torres  Procedure(s) Performed: COLONOSCOPY EGD (ESOPHAGOGASTRODUODENOSCOPY) DILATION, ESOPHAGUS  Patient location during evaluation: PACU Anesthesia Type: General Level of consciousness: awake and alert Pain management: pain level controlled Vital Signs Assessment: post-procedure vital signs reviewed and stable Respiratory status: spontaneous breathing, nonlabored ventilation, respiratory function stable and patient connected to nasal cannula oxygen Cardiovascular status: blood pressure returned to baseline and stable Postop Assessment: no apparent nausea or vomiting Anesthetic complications: no   No notable events documented.   Last Vitals:  Vitals:   09/05/24 0753 09/05/24 0930  BP: (!) 167/58 (!) 115/40  Pulse: (!) 56 (!) 58  Resp: 18 20  Temp: 36.7 C 36.7 C  SpO2: 100% 99%    Last Pain:  Vitals:   09/05/24 0930  TempSrc: Axillary  PainSc: 0-No pain                 Andrea Limes

## 2024-09-05 NOTE — Op Note (Signed)
 [facility] Patient Name: Martha Torres Procedure Date: 09/05/2024 8:41 AM MRN: [id number] Date of Birth: 1946-02-04 Attending MD: Carlin POUR. Cindie HAS, [id number] CSN: [id number] Age: 78 Admit Type: Outpatient Procedure:                Upper GI endoscopy Indications:              Iron deficiency anemia, Dysphagia Providers:                Carlin POUR. Cindie, DO, Leandrew Edelman RN, RN, Daphne Mulch Technician, Technician Referring MD:              Medicines:                See the Anesthesia note for documentation of the                            administered medications Complications:            No immediate complications. Estimated Blood Loss:     Estimated blood loss was minimal. Procedure:                Pre-Anesthesia Assessment:                           - The anesthesia plan was to use monitored                            anesthesia care (MAC).                           After obtaining informed consent, the endoscope was                            passed under direct vision. Throughout the                            procedure, the patient's blood pressure, pulse, and                            oxygen saturations were monitored continuously. The                            [id number]-[id number] ([id number]) Upper was introduced through                            the mouth, and advanced to the second part of                            duodenum. The upper GI endoscopy was accomplished                            without difficulty. The patient tolerated the                            procedure well.  Scope In: 8:55:06 AM Scope Out: 9:03:20 AM Total Procedure Duration: 0 hours 8 minutes 14 seconds  Findings:      A mild Schatzki ring was found in the distal esophagus. A TTS dilator       was passed through the scope. Dilation with an 18-19-20 mm balloon       dilator was performed to 20 mm. The dilation site was examined and       showed moderate improvement in  luminal narrowing.      A large hiatal hernia was present.      Three 5 to 7 mm sessile polyps with no bleeding and no stigmata of       recent bleeding were found in the gastric body. The polyp was removed       with a cold snare. Resection and retrieval were complete.      Patchy mild inflammation characterized by erythema was found in the       entire examined stomach. Biopsies were taken with a cold forceps for       Helicobacter pylori testing.      The duodenal bulb, first portion of the duodenum and second portion of       the duodenum were normal. Impression:               - Mild Schatzki ring. Dilated.                           - Large hiatal hernia.                           - Three gastric polyps. Resected and retrieved.                           - Gastritis. Biopsied.                           - Normal duodenal bulb, first portion of the                            duodenum and second portion of the duodenum. Moderate Sedation:      Per Anesthesia Care Recommendation:           - Patient has a contact number available for                            emergencies. The signs and symptoms of potential                            delayed complications were discussed with the                            patient. Return to normal activities tomorrow.                            Written discharge instructions were provided to the                            patient.                           -  Resume previous diet.                           - Continue present medications.                           - Await pathology results.                           - Repeat upper endoscopy PRN for retreatment.                           - Return to GI clinic in 3 months.                           - Use Prilosec (omeprazole) 40 mg PO daily. Procedure Code(s):        --- Professional ---                           [id number], Esophagogastroduodenoscopy, flexible,                            transoral; with  removal of tumor(s), polyp(s), or                            other lesion(s) by snare technique                           43249, Esophagogastroduodenoscopy, flexible,                            transoral; with transendoscopic balloon dilation of                            esophagus (less than 30 mm diameter)                           43239, 59, Esophagogastroduodenoscopy, flexible,                            transoral; with biopsy, single or multiple Diagnosis Code(s):        --- Professional ---                           K22.2, Esophageal obstruction                           K44.9, Diaphragmatic hernia without obstruction or                            gangrene                           K31.7, Polyp of stomach and duodenum                           K29.70, Gastritis, unspecified, without bleeding  D50.9, Iron deficiency anemia, unspecified                           R13.10, Dysphagia, unspecified CPT copyright 2022 American Medical Association. All rights reserved. The codes documented in this report are preliminary and upon coder review may  be revised to meet current compliance requirements. Carlin POUR. Cindie, DO Carlin POUR. Shamera Yarberry, DO 09/05/2024 9:29:18 AM This report has been signed electronically. Number of Addenda: 0

## 2024-09-06 ENCOUNTER — Encounter (HOSPITAL_COMMUNITY): Payer: Self-pay | Admitting: Internal Medicine

## 2024-09-06 LAB — SURGICAL PATHOLOGY

## 2024-09-12 ENCOUNTER — Encounter: Payer: Self-pay | Admitting: Cardiology

## 2024-09-12 ENCOUNTER — Ambulatory Visit: Attending: Cardiology | Admitting: Cardiology

## 2024-09-12 VITALS — BP 140/72 | HR 63 | Ht 65.0 in | Wt 213.2 lb

## 2024-09-12 DIAGNOSIS — I6523 Occlusion and stenosis of bilateral carotid arteries: Secondary | ICD-10-CM

## 2024-09-12 DIAGNOSIS — I480 Paroxysmal atrial fibrillation: Secondary | ICD-10-CM | POA: Diagnosis not present

## 2024-09-12 DIAGNOSIS — I10 Essential (primary) hypertension: Secondary | ICD-10-CM | POA: Diagnosis not present

## 2024-09-12 DIAGNOSIS — E782 Mixed hyperlipidemia: Secondary | ICD-10-CM | POA: Diagnosis not present

## 2024-09-12 NOTE — Progress Notes (Signed)
    Cardiology Office Note  Date: 09/12/2024   ID: Martha Torres, DOB 12-18-1946, MRN [id number]  History of Present Illness: Martha Torres is a 78 y.o. female last seen in March.  She is here for a follow-up visit.  Reports no exertional chest pain or palpitations.  She has undergone workup for iron deficiency anemia, recently underwent EGD and colonoscopy.  Medications reviewed.  She continues on stable cardiac regimen.  I reviewed her interval lab work.  LDL 67 in July on Crestor  10 mg daily.  ECG from March is stable.  She continues to follow with VVS status post right CEA and right carotid artery stenting.  She remains on low-dose aspirin .  Physical Exam: VS:  BP (!) 140/72 (BP Location: Left Arm)   Pulse 63   Ht 5' 5 (1.651 m)   Wt 213 lb 3.2 oz (96.7 kg)   SpO2 98%   BMI 35.48 kg/m , BMI Body mass index is 35.48 kg/m.  Wt Readings from Last 3 Encounters:  09/12/24 213 lb 3.2 oz (96.7 kg)  09/03/24 218 lb 1.6 oz (98.9 kg)  08/22/24 218 lb 1.6 oz (98.9 kg)    General: Patient appears comfortable at rest. HEENT: Conjunctiva and lids normal. Neck: Supple, no elevated JVP or carotid bruits. Lungs: Clear to auscultation, nonlabored breathing at rest. Cardiac: Regular rate and rhythm, no S3 or significant systolic murmur.  ECG:  An ECG dated 03/26/2024 was personally reviewed today and demonstrated:  Sinus rhythm with decreased R wave progression.  Labwork: 11/15/2023: BUN 30; Creatinine, Ser 1.18; Potassium 4.1; Sodium 138  July 2025: TSH 2.35, hemoglobin 9.6, platelets 308, BUN 20, creatinine 1.17, GFR 48, potassium 4.1, AST 17, ALT 15, cholesterol 138, triglycerides 133, HDL 48, LDL 67, hemoglobin A1c 6.5%  Other Studies Reviewed Today:  No interval cardiac testing for review today.  Assessment and Plan:  1.  Paroxysmal to persistent atrial fibrillation with CHA2DS2-VASc score of 6.  She continues to do well with no recurring palpitations.  Continue amiodarone  200  mg daily and Xarelto  20 mg daily.  Creatinine clearance 62.  Recent TSH and LFTs normal.   2.  Carotid artery disease status post right CEA in 2002 and subsequent right carotid artery stenting in 2021.  Carotid Dopplers in September 2021 revealed patent RICA stent site and 40 to 59% LICA stenosis.  She continues to follow with VVS.  Remains on aspirin  81 mg daily.   3.  Primary hypertension.  Continue atenolol  50 mg twice daily, chlorthalidone  25 mg daily with potassium supplement, and Cozaar  100 mg daily.  4.  Mixed hyperlipidemia, LDL 67 in July.  Continue Crestor  10 mg daily.  Disposition:  Follow up 6 months.  Signed, Jayson JUDITHANN Sierras, M.D., F.A.C.C. [facility] at [facility]

## 2024-09-12 NOTE — Patient Instructions (Addendum)

## 2024-09-17 DIAGNOSIS — M19011 Primary osteoarthritis, right shoulder: Secondary | ICD-10-CM | POA: Diagnosis not present

## 2024-09-18 ENCOUNTER — Ambulatory Visit: Payer: Self-pay | Admitting: Internal Medicine

## 2024-09-24 DIAGNOSIS — G4700 Insomnia, unspecified: Secondary | ICD-10-CM | POA: Diagnosis not present

## 2024-09-24 DIAGNOSIS — D649 Anemia, unspecified: Secondary | ICD-10-CM | POA: Diagnosis not present

## 2024-09-24 DIAGNOSIS — K219 Gastro-esophageal reflux disease without esophagitis: Secondary | ICD-10-CM | POA: Diagnosis not present

## 2024-09-24 DIAGNOSIS — I10 Essential (primary) hypertension: Secondary | ICD-10-CM | POA: Diagnosis not present

## 2024-09-24 DIAGNOSIS — I6521 Occlusion and stenosis of right carotid artery: Secondary | ICD-10-CM | POA: Diagnosis not present

## 2024-09-24 DIAGNOSIS — E782 Mixed hyperlipidemia: Secondary | ICD-10-CM | POA: Diagnosis not present

## 2024-09-24 DIAGNOSIS — Z23 Encounter for immunization: Secondary | ICD-10-CM | POA: Diagnosis not present

## 2024-09-24 DIAGNOSIS — K625 Hemorrhage of anus and rectum: Secondary | ICD-10-CM | POA: Diagnosis not present

## 2024-09-24 DIAGNOSIS — I480 Paroxysmal atrial fibrillation: Secondary | ICD-10-CM | POA: Diagnosis not present

## 2024-09-24 DIAGNOSIS — M170 Bilateral primary osteoarthritis of knee: Secondary | ICD-10-CM | POA: Diagnosis not present

## 2024-09-26 NOTE — Progress Notes (Unsigned)
 HISTORY AND PHYSICAL     CC:  follow up. Requesting Provider:  Shona Norleen PEDLAR, MD  HPI: This is a 78 y.o. female here for follow up for carotid artery stenosis.  Pt is s/p right TCAR for asymptomatic carotid artery stenosis on 04/21/2020 by Dr. Sheree and Dr. Eliza.  She also had hx of right CEA in 2002 by Dr. Dyane.    Pt was last seen 09/01/2023 and at that time she was not having any neurological sx.  She was on Xarelto  for afib.  She was compliant with her asa/statin.  Pt returns today for follow up.    Pt denies any amaurosis fugax, speech difficulties, weakness, numbness, paralysis or clumsiness or facial droop.  She denies any rest pain or non healing ulcers on her feet.   She inquires about taking her asa every other day as she is on Xarelto  and gets a lot of bruising.   The pt is on a statin for cholesterol management.  The pt is on a daily aspirin .   Other AC:  Xarelto  The pt is on BB, ARB for hypertension.   The pt is  on medication for diabetes Tobacco hx:  former  Pt does not have family hx of AAA.  Past Medical History:  Diagnosis Date   Carotid artery disease    Right CEA in 2002; right carotid artery stent 2021   Chronic low back pain    MRI 2014: Spondylolisthesis of L4-L5 with foraminal narrowing, most prominent on the right and facet arthropathy.  L4-L5 surgery planned for 04/2013 with left-sided pedicle screw fixation.   Degenerative joint disease    Depression    Diarrhea, functional    DVT (deep venous thrombosis) (HCC)    Essential hypertension    Gastroesophageal reflux    H/o stricture & hiatal hernia   History of cardiac catheterization    Normal coronaries 2006   History of kidney stones    Hyperlipidemia    Hypothyroidism    MRSA (methicillin resistant Staphylococcus aureus) 05/2013   Neuropathy    right leg   Paroxysmal atrial fibrillation (HCC) 04/2013   PONV (postoperative nausea and vomiting)    Type 2 diabetes mellitus (HCC)     Past  Surgical History:  Procedure Laterality Date   BIOPSY  02/26/2019   Procedure: BIOPSY;  Surgeon: Harvey Margo CROME, MD;  Location: [facility];  Service: Endoscopy;;  random colon bx's   CAROTID ENDARTERECTOMY Right 10/25/2001   Subsequent to episode of syncope   COLONOSCOPY N/A 02/26/2019   Procedure: COLONOSCOPY;  Surgeon: Harvey Margo CROME, MD; left colon s/p biopsy, moderate diverticulosis in the rectosigmoid, sigmoid, descending, and transverse colon, external and internal hemorrhoids, no obvious source of diarrhea identified.  Random colon biopsies were benign.     COLONOSCOPY N/A 09/05/2024   Procedure: COLONOSCOPY;  Surgeon: Cindie Carlin POUR, DO;  Location: [facility];  Service: Endoscopy;  Laterality: N/A;  10:00 am, asa 3   ESOPHAGEAL DILATION N/A 09/05/2024   Procedure: DILATION, ESOPHAGUS;  Surgeon: Cindie Carlin POUR, DO;  Location: [facility];  Service: Endoscopy;  Laterality: N/A;   ESOPHAGOGASTRODUODENOSCOPY N/A 09/05/2024   Procedure: EGD (ESOPHAGOGASTRODUODENOSCOPY);  Surgeon: Cindie Carlin POUR, DO;  Location: [facility];  Service: Endoscopy;  Laterality: N/A;   LAMINECTOMY  2000   L5-S1 discectomy and laminectomy; 3 other surgical procedures subsequently performed   LAPAROSCOPIC CHOLECYSTECTOMY     SPINE SURGERY  05-23-13   X's 5 surgeries  TOTAL KNEE ARTHROPLASTY Right 08/23/2022   Procedure: TOTAL KNEE ARTHROPLASTY;  Surgeon: Melodi Lerner, MD;  Location: [facility];  Service: Orthopedics;  Laterality: Right;   TRANSCAROTID ARTERY REVASCULARIZATION  Right 04/21/2020   Procedure: TRANSCAROTID ARTERY REVASCULARIZATION RIGHT;  Surgeon: Eliza Lonni RAMAN, MD;  Location: [facility];  Service: Vascular;  Laterality: Right;   VAGINAL HYSTERECTOMY      No Known Allergies  Current Outpatient Medications  Medication Sig Dispense Refill   Accu-Chek Softclix Lancets lancets 1 each 3 (three) times daily.     acetaminophen  (TYLENOL ) 650 MG CR tablet Take 650 mg by mouth 2 (two)  times daily.     albuterol  (VENTOLIN  HFA) 108 (90 Base) MCG/ACT inhaler Inhale 1-2 puffs into the lungs daily as needed.     ALPRAZolam  (XANAX ) 0.5 MG tablet Take 0.5 mg by mouth at bedtime. (Patient not taking: Reported on 09/12/2024)     ALPRAZolam  (XANAX ) 1 MG tablet Take 1 mg by mouth daily as needed.     amiodarone  (PACERONE ) 200 MG tablet take 1 tablet (200 milligram total) by mouth daily. 90 tablet 2   aspirin  EC 81 MG tablet Take 81 mg by mouth daily.     atenolol  (TENORMIN ) 50 MG tablet Take 1 tablet (50 mg total) by mouth 2 (two) times daily. 180 tablet 1   chlorthalidone  (HYGROTON ) 25 MG tablet Take 25 mg by mouth daily.      ferrous sulfate  325 (65 FE) MG EC tablet Take 1 tablet (325 mg total) by mouth daily with breakfast. 30 tablet 11   gabapentin  (NEURONTIN ) 300 MG capsule Take 300 mg by mouth 2 (two) times daily.     glipiZIDE  (GLUCOTROL  XL) 5 MG 24 hr tablet Take 5 mg by mouth 2 (two) times daily.      levothyroxine  (SYNTHROID , LEVOTHROID) 50 MCG tablet Take 50 mcg by mouth daily before breakfast.      losartan  (COZAAR ) 100 MG tablet Take 100 mg by mouth daily.     Magnesium  Oxide -Mg Supplement 200 MG TABS Take 1 tablet by mouth daily.     MAGNESIUM  PO Take 165 mg by mouth daily. (Patient not taking: Reported on 09/12/2024)     Multiple Vitamin (MULTI-VITAMIN) tablet Take 1 tablet by mouth daily.     omeprazole (PRILOSEC) 20 MG capsule Take 20 mg by mouth daily.     ondansetron  (ZOFRAN -ODT) 4 MG disintegrating tablet Take 4 mg by mouth every 8 (eight) hours as needed. (Patient not taking: Reported on 09/12/2024)     potassium chloride  SA (KLOR-CON ) 20 MEQ tablet Take 1-2 tablets (20-40 mEq total) by mouth See admin instructions. 40 meq in the morning and 20 meq in the bedtime     rivaroxaban  (XARELTO ) 20 MG TABS tablet Take 1 tablet (20 mg total) by mouth daily with supper. 28 tablet 0   rosuvastatin  (CRESTOR ) 10 MG tablet Take 10 mg by mouth at bedtime.     No current  facility-administered medications for this visit.    Family History  Problem Relation Age of Onset   Heart attack Father        Also mother   Cancer - Prostate Father    Heart disease Father        Heart Disease before age 46   Hyperlipidemia Father    Hypertension Sister    Hyperlipidemia Sister    Cancer Sister    Stroke Mother    Heart disease Mother  Heart Disease before age 62   Hyperlipidemia Mother    Heart attack Mother    Diabetes Sister    Hyperlipidemia Sister    Hypertension Sister    Hyperlipidemia Brother    Cancer Brother        Luekemia   Hypertension Brother    Colon cancer Neg Hx     Social History   Socioeconomic History   Marital status: Widowed    Spouse name: Not on file   Number of children: 3   Years of education: Not on file   Highest education level: Not on file  Occupational History   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 [employer]  Tobacco Use   Smoking status: Former    Current packs/day: 0.00    Average packs/day: 0.2 packs/day for 43.7 years (10.9 ttl pk-yrs)    Types: Cigarettes    Start date: 03/10/1965    Quit date: 11/06/2008    Years since quitting: 15.8   Smokeless tobacco: Never  Vaping Use   Vaping status: Never Used  Substance and Sexual Activity   Alcohol use: Never    Alcohol/week: 1.0 standard drink of alcohol    Types: 1 Standard drinks or equivalent per week   Drug use: Never   Sexual activity: Not on file  Other Topics Concern   Not on file  Social History Narrative   Not on file   Social Drivers of Health   Financial Resource Strain: Low Risk  (02/09/2024)   Received from [facility]   Overall Financial Resource Strain (CARDIA)    Difficulty of Paying Living Expenses: Not very hard  Food Insecurity: No Food Insecurity (02/09/2024)   Received from [facility]   Hunger Vital Sign    Within the past 12 months, you worried that your food would run out  before you got the money to buy more.: Never true    Within the past 12 months, the food you bought just didn't last and you didn't have money to get more.: Never true  Transportation Needs: No Transportation Needs (02/09/2024)   Received from [facility]   PRAPARE - Transportation    Lack of Transportation (Medical): No    Lack of Transportation (Non-Medical): No  Physical Activity: Not on file  Stress: Not on file  Social Connections: Not on file  Intimate Partner Violence: Not At Risk (02/09/2024)   Received from [facility]   Humiliation, Afraid, Rape, and Kick questionnaire    Within the last year, have you been afraid of your partner or ex-partner?: No    Within the last year, have you been humiliated or emotionally abused in other ways by your partner or ex-partner?: No    Within the last year, have you been kicked, hit, slapped, or otherwise physically hurt by your partner or ex-partner?: No    Within the last year, have you been raped or forced to have any kind of sexual activity by your partner or ex-partner?: No     REVIEW OF SYSTEMS:   [X]  denotes positive finding, [ ]  denotes negative finding Cardiac  Comments:  Chest pain or chest pressure:    Shortness of breath upon exertion:    Short of breath when lying flat:    Irregular heart rhythm:        Vascular    Pain in calf, thigh, or hip brought on by ambulation:    Pain in feet at night  that wakes you up from your sleep:     Blood clot in your veins:    Leg swelling:         Pulmonary    Oxygen at home:    Productive cough:     Wheezing:         Neurologic    Sudden weakness in arms or legs:     Sudden numbness in arms or legs:     Sudden onset of difficulty speaking or slurred speech:    Temporary loss of vision in one eye:     Problems with dizziness:         Gastrointestinal    Blood in stool:     Vomited blood:         Genitourinary    Burning when urinating:     Blood in urine:         Psychiatric    Major depression:         Hematologic    Bleeding problems:    Problems with blood clotting too easily:        Skin    Rashes or ulcers:        Constitutional    Fever or chills:      PHYSICAL EXAMINATION:  Today's Vitals   09/28/24 0913 09/28/24 0915  BP: (!) 147/83 (!) 140/72  Pulse: (!) 50   Temp: 97.7 F (36.5 C)   SpO2: 96%    There is no height or weight on file to calculate BMI.   General:  WDWN in NAD; vital signs documented above Gait: Not observed HENT: WNL, normocephalic Pulmonary: normal non-labored breathing Cardiac: regular HR, without carotid bruits Abdomen: soft, NT; aortic pulse is not palpable Skin: without rashes; bruising bilateral arms Vascular Exam/Pulses:  Right Left  Radial 2+ (normal) 2+ (normal)   Extremities: without open wounds Musculoskeletal: no muscle wasting or atrophy  Neurologic: A&O X 3; moving all extremities equally; speech is fluent/normal Psychiatric:  The pt has Normal affect.   Non-Invasive Vascular Imaging:   Carotid Duplex on 09/28/2024 Right:  patent stent without stenosis Left:  40-59% ICA stenosis Vertebrals:  Bilateral vertebral arteries demonstrate antegrade flow.  Subclavians: Normal flow hemodynamics were seen in bilateral subclavian arteries   Previous Carotid duplex on 09/01/2023: Right: patent stent without stenosis Left:   40-59% ICA stenosis    ASSESSMENT/PLAN:: 78 y.o. female here for follow up carotid artery stenosis and has hx of  right TCAR for asymptomatic carotid artery stenosis on 04/21/2020 by Dr. Sheree and Dr. Eliza.  She also had hx of right CEA in 2002 by Dr. Dyane.    -duplex today reveals patent right ICA stent without stenosis and left remains in the 40-59% range. . -pt will f/u in one year with carotid duplex -pt will call sooner should she have any issues. -continue statin/asa and she is on Xarelto  for afib.   Lucie Apt, [facility] [phone]  Clinic MD:  [place]

## 2024-09-28 ENCOUNTER — Ambulatory Visit (HOSPITAL_COMMUNITY)
Admission: RE | Admit: 2024-09-28 | Discharge: 2024-09-28 | Disposition: A | Discharge status: Home / Self Care | Source: Ambulatory Visit | Attending: Vascular Surgery | Admitting: Vascular Surgery

## 2024-09-28 ENCOUNTER — Ambulatory Visit (INDEPENDENT_AMBULATORY_CARE_PROVIDER_SITE_OTHER)

## 2024-09-28 ENCOUNTER — Encounter: Payer: Self-pay | Admitting: Physician Assistant

## 2024-09-28 VITALS — BP 140/72 | HR 50 | Temp 97.7°F

## 2024-09-28 DIAGNOSIS — I6523 Occlusion and stenosis of bilateral carotid arteries: Secondary | ICD-10-CM | POA: Insufficient documentation

## 2024-10-13 DIAGNOSIS — H40033 Anatomical narrow angle, bilateral: Secondary | ICD-10-CM | POA: Diagnosis not present

## 2024-10-13 DIAGNOSIS — H2513 Age-related nuclear cataract, bilateral: Secondary | ICD-10-CM | POA: Diagnosis not present

## 2024-12-05 ENCOUNTER — Ambulatory Visit: Admitting: Gastroenterology

## 2025-01-30 ENCOUNTER — Ambulatory Visit: Admitting: Gastroenterology

## 2025-03-12 ENCOUNTER — Ambulatory Visit: Admitting: Cardiology
